# Patient Record
Sex: Female | Born: 1955
Health system: Southern US, Community
[De-identification: ages and names within clinical notes are randomized; demographics above are authoritative.]

## PROBLEM LIST (undated history)

## (undated) DIAGNOSIS — F419 Anxiety disorder, unspecified: Secondary | ICD-10-CM

## (undated) DIAGNOSIS — C55 Malignant neoplasm of uterus, part unspecified: Secondary | ICD-10-CM

## (undated) DIAGNOSIS — I10 Essential (primary) hypertension: Secondary | ICD-10-CM

## (undated) DIAGNOSIS — E039 Hypothyroidism, unspecified: Secondary | ICD-10-CM

## (undated) DIAGNOSIS — E785 Hyperlipidemia, unspecified: Secondary | ICD-10-CM

## (undated) DIAGNOSIS — F32A Depression, unspecified: Secondary | ICD-10-CM

## (undated) DIAGNOSIS — Z90710 Acquired absence of both cervix and uterus: Secondary | ICD-10-CM

## (undated) DIAGNOSIS — T1491XA Suicide attempt, initial encounter: Secondary | ICD-10-CM

## (undated) HISTORY — DX: Acquired absence of both cervix and uterus: Z90.710

## (undated) HISTORY — PX: ABDOMINAL HYSTERECTOMY: SHX81

## (undated) HISTORY — DX: Malignant neoplasm of uterus, part unspecified: C55

## (undated) HISTORY — DX: Hypothyroidism, unspecified: E03.9

## (undated) HISTORY — DX: Hyperlipidemia, unspecified: E78.5

## (undated) HISTORY — DX: Essential (primary) hypertension: I10

## (undated) HISTORY — DX: Suicide attempt, initial encounter: T14.91XA

## (undated) HISTORY — PX: DILATION AND CURETTAGE OF UTERUS: SHX78

---

## 1995-03-26 DIAGNOSIS — T1491XA Suicide attempt, initial encounter: Secondary | ICD-10-CM

## 1995-03-26 HISTORY — DX: Suicide attempt, initial encounter: T14.91XA

## 1996-03-25 HISTORY — PX: CARPAL TUNNEL RELEASE: SHX101

## 1999-04-03 ENCOUNTER — Ambulatory Visit (HOSPITAL_BASED_OUTPATIENT_CLINIC_OR_DEPARTMENT_OTHER): Admission: RE | Admit: 1999-04-03 | Discharge: 1999-04-03 | Payer: Self-pay | Admitting: Orthopedic Surgery

## 2000-03-25 HISTORY — PX: VESICOVAGINAL FISTULA CLOSURE W/ TAH: SUR271

## 2002-01-12 ENCOUNTER — Ambulatory Visit (HOSPITAL_COMMUNITY): Admission: RE | Admit: 2002-01-12 | Discharge: 2002-01-12 | Payer: Self-pay | Admitting: Internal Medicine

## 2002-01-12 ENCOUNTER — Encounter: Payer: Self-pay | Admitting: Internal Medicine

## 2003-03-26 DIAGNOSIS — C55 Malignant neoplasm of uterus, part unspecified: Secondary | ICD-10-CM

## 2003-03-26 HISTORY — DX: Malignant neoplasm of uterus, part unspecified: C55

## 2004-02-22 ENCOUNTER — Ambulatory Visit (HOSPITAL_COMMUNITY): Admission: RE | Admit: 2004-02-22 | Discharge: 2004-02-22 | Payer: Self-pay | Admitting: Orthopedic Surgery

## 2004-09-13 ENCOUNTER — Encounter: Admission: RE | Admit: 2004-09-13 | Discharge: 2004-10-12 | Payer: Self-pay | Admitting: Family Medicine

## 2005-02-05 ENCOUNTER — Other Ambulatory Visit: Admission: RE | Admit: 2005-02-05 | Discharge: 2005-02-05 | Payer: Self-pay

## 2005-03-14 ENCOUNTER — Encounter: Payer: Self-pay | Admitting: Obstetrics and Gynecology

## 2005-03-14 ENCOUNTER — Encounter (INDEPENDENT_AMBULATORY_CARE_PROVIDER_SITE_OTHER): Payer: Self-pay | Admitting: Specialist

## 2005-03-14 ENCOUNTER — Inpatient Hospital Stay (HOSPITAL_COMMUNITY): Admission: RE | Admit: 2005-03-14 | Discharge: 2005-03-17 | Payer: Self-pay | Admitting: Obstetrics and Gynecology

## 2006-02-06 ENCOUNTER — Ambulatory Visit (HOSPITAL_COMMUNITY): Admission: RE | Admit: 2006-02-06 | Discharge: 2006-02-06 | Payer: Self-pay | Admitting: Family Medicine

## 2007-04-02 ENCOUNTER — Encounter: Payer: Self-pay | Admitting: Family Medicine

## 2007-04-02 LAB — HM COLONOSCOPY

## 2007-08-13 ENCOUNTER — Emergency Department (HOSPITAL_COMMUNITY): Admission: EM | Admit: 2007-08-13 | Discharge: 2007-08-13 | Payer: Self-pay | Admitting: Emergency Medicine

## 2007-09-08 ENCOUNTER — Emergency Department (HOSPITAL_COMMUNITY): Admission: EM | Admit: 2007-09-08 | Discharge: 2007-09-09 | Payer: Self-pay | Admitting: Emergency Medicine

## 2007-09-09 ENCOUNTER — Inpatient Hospital Stay (HOSPITAL_COMMUNITY): Admission: AD | Admit: 2007-09-09 | Discharge: 2007-09-15 | Payer: Self-pay | Admitting: Psychiatry

## 2007-09-09 ENCOUNTER — Ambulatory Visit: Payer: Self-pay | Admitting: *Deleted

## 2008-08-23 ENCOUNTER — Ambulatory Visit: Payer: Self-pay | Admitting: Family Medicine

## 2008-08-23 DIAGNOSIS — IMO0001 Reserved for inherently not codable concepts without codable children: Secondary | ICD-10-CM | POA: Insufficient documentation

## 2008-08-23 DIAGNOSIS — F418 Other specified anxiety disorders: Secondary | ICD-10-CM

## 2008-08-23 DIAGNOSIS — R269 Unspecified abnormalities of gait and mobility: Secondary | ICD-10-CM | POA: Insufficient documentation

## 2008-08-23 DIAGNOSIS — I10 Essential (primary) hypertension: Secondary | ICD-10-CM

## 2008-08-23 DIAGNOSIS — E039 Hypothyroidism, unspecified: Secondary | ICD-10-CM

## 2008-08-23 DIAGNOSIS — R519 Headache, unspecified: Secondary | ICD-10-CM | POA: Insufficient documentation

## 2008-08-23 DIAGNOSIS — J42 Unspecified chronic bronchitis: Secondary | ICD-10-CM | POA: Insufficient documentation

## 2008-08-23 DIAGNOSIS — R51 Headache: Secondary | ICD-10-CM | POA: Insufficient documentation

## 2008-08-23 LAB — CONVERTED CEMR LAB
Blood in Urine, dipstick: NEGATIVE
Ketones, urine, test strip: NEGATIVE
Nitrite: NEGATIVE
Specific Gravity, Urine: <1.005
pH: 7

## 2008-08-25 ENCOUNTER — Encounter: Payer: Self-pay | Admitting: Family Medicine

## 2008-08-29 DIAGNOSIS — N3020 Other chronic cystitis without hematuria: Secondary | ICD-10-CM | POA: Insufficient documentation

## 2008-08-30 ENCOUNTER — Ambulatory Visit (HOSPITAL_COMMUNITY): Admission: RE | Admit: 2008-08-30 | Discharge: 2008-08-30 | Payer: Self-pay | Admitting: Family Medicine

## 2008-08-30 ENCOUNTER — Ambulatory Visit (HOSPITAL_COMMUNITY): Payer: Self-pay | Admitting: Psychiatry

## 2008-08-31 ENCOUNTER — Encounter: Payer: Self-pay | Admitting: Family Medicine

## 2008-09-01 ENCOUNTER — Telehealth: Payer: Self-pay | Admitting: Family Medicine

## 2008-09-01 ENCOUNTER — Encounter: Payer: Self-pay | Admitting: Family Medicine

## 2008-09-01 LAB — CONVERTED CEMR LAB
ALT: 36 units/L — ABNORMAL HIGH (ref 0–35)
Albumin: 4.4 g/dL (ref 3.5–5.2)
Bilirubin, Direct: <0.1 mg/dL (ref 0.0–0.3)
CO2: 23 meq/L (ref 19–32)
Glucose, Bld: 97 mg/dL (ref 70–99)
HDL: 68 mg/dL (ref >39)
Hemoglobin: 11.8 g/dL — ABNORMAL LOW (ref 12.0–15.0)
Lymphocytes Relative: 25 % (ref 12–46)
Lymphs Abs: 1.5 10*3/uL (ref 0.7–4.0)
MCHC: 31.1 g/dL (ref 30.0–36.0)
Monocytes Absolute: 0.5 10*3/uL (ref 0.1–1.0)
Monocytes Relative: 9 % (ref 3–12)
Neutro Abs: 3.7 10*3/uL (ref 1.7–7.7)
Potassium: 4.1 meq/L (ref 3.5–5.3)
RBC: 4 M/uL (ref 3.87–5.11)
Retic Ct Pct: 0.8 % (ref 0.4–3.1)
Sodium: 139 meq/L (ref 135–145)
TSH: 1.751 microintl units/mL (ref 0.350–4.500)
Total Bilirubin: 0.2 mg/dL — ABNORMAL LOW (ref 0.3–1.2)
Total CHOL/HDL Ratio: 2.5
VLDL: 17 mg/dL (ref 0–40)
WBC: 5.8 10*3/uL (ref 4.0–10.5)

## 2008-09-05 ENCOUNTER — Telehealth: Payer: Self-pay | Admitting: Family Medicine

## 2008-09-06 ENCOUNTER — Ambulatory Visit (HOSPITAL_COMMUNITY): Payer: Self-pay | Admitting: Psychiatry

## 2008-09-08 ENCOUNTER — Encounter: Admission: RE | Admit: 2008-09-08 | Discharge: 2008-12-07 | Payer: Self-pay | Admitting: Unknown Physician Specialty

## 2008-09-08 ENCOUNTER — Encounter: Payer: Self-pay | Admitting: Family Medicine

## 2008-09-09 ENCOUNTER — Telehealth: Payer: Self-pay | Admitting: Family Medicine

## 2008-09-13 ENCOUNTER — Ambulatory Visit (HOSPITAL_COMMUNITY): Payer: Self-pay | Admitting: Psychiatry

## 2008-09-13 ENCOUNTER — Ambulatory Visit: Payer: Self-pay | Admitting: Family Medicine

## 2008-09-13 DIAGNOSIS — R05 Cough: Secondary | ICD-10-CM | POA: Insufficient documentation

## 2008-09-27 ENCOUNTER — Other Ambulatory Visit: Admission: RE | Admit: 2008-09-27 | Discharge: 2008-09-27 | Payer: Self-pay | Admitting: Family Medicine

## 2008-09-27 ENCOUNTER — Ambulatory Visit (HOSPITAL_COMMUNITY): Admission: RE | Admit: 2008-09-27 | Discharge: 2008-09-27 | Payer: Self-pay | Admitting: Family Medicine

## 2008-09-27 ENCOUNTER — Ambulatory Visit: Payer: Self-pay | Admitting: Family Medicine

## 2008-09-27 ENCOUNTER — Encounter: Payer: Self-pay | Admitting: Family Medicine

## 2008-09-27 DIAGNOSIS — M25529 Pain in unspecified elbow: Secondary | ICD-10-CM | POA: Insufficient documentation

## 2008-09-27 DIAGNOSIS — M542 Cervicalgia: Secondary | ICD-10-CM | POA: Insufficient documentation

## 2008-10-11 ENCOUNTER — Ambulatory Visit (HOSPITAL_COMMUNITY): Payer: Self-pay | Admitting: Psychiatry

## 2008-10-14 ENCOUNTER — Ambulatory Visit (HOSPITAL_COMMUNITY): Payer: Self-pay | Admitting: Psychiatry

## 2008-10-21 ENCOUNTER — Encounter: Payer: Self-pay | Admitting: Family Medicine

## 2008-10-26 ENCOUNTER — Telehealth: Payer: Self-pay | Admitting: Family Medicine

## 2008-10-28 ENCOUNTER — Ambulatory Visit (HOSPITAL_COMMUNITY): Payer: Self-pay | Admitting: Psychiatry

## 2008-11-03 ENCOUNTER — Encounter: Payer: Self-pay | Admitting: Family Medicine

## 2008-11-14 ENCOUNTER — Ambulatory Visit (HOSPITAL_COMMUNITY): Admission: RE | Admit: 2008-11-14 | Discharge: 2008-11-14 | Payer: Self-pay | Admitting: Pulmonary Disease

## 2008-11-14 ENCOUNTER — Encounter: Payer: Self-pay | Admitting: Family Medicine

## 2008-11-16 ENCOUNTER — Encounter: Payer: Self-pay | Admitting: Family Medicine

## 2008-11-24 ENCOUNTER — Ambulatory Visit: Payer: Self-pay | Admitting: Orthopedic Surgery

## 2008-11-24 DIAGNOSIS — M7710 Lateral epicondylitis, unspecified elbow: Secondary | ICD-10-CM | POA: Insufficient documentation

## 2008-11-30 ENCOUNTER — Telehealth: Payer: Self-pay | Admitting: Orthopedic Surgery

## 2008-12-01 ENCOUNTER — Ambulatory Visit (HOSPITAL_COMMUNITY): Payer: Self-pay | Admitting: Psychiatry

## 2008-12-08 ENCOUNTER — Telehealth: Payer: Self-pay | Admitting: Family Medicine

## 2008-12-15 ENCOUNTER — Encounter: Payer: Self-pay | Admitting: Family Medicine

## 2008-12-29 ENCOUNTER — Ambulatory Visit: Payer: Self-pay | Admitting: Family Medicine

## 2008-12-29 DIAGNOSIS — IMO0002 Reserved for concepts with insufficient information to code with codable children: Secondary | ICD-10-CM | POA: Insufficient documentation

## 2009-01-08 DIAGNOSIS — G4700 Insomnia, unspecified: Secondary | ICD-10-CM | POA: Insufficient documentation

## 2009-01-08 DIAGNOSIS — E669 Obesity, unspecified: Secondary | ICD-10-CM | POA: Insufficient documentation

## 2009-01-08 DIAGNOSIS — F17200 Nicotine dependence, unspecified, uncomplicated: Secondary | ICD-10-CM | POA: Insufficient documentation

## 2009-01-09 ENCOUNTER — Telehealth: Payer: Self-pay | Admitting: Family Medicine

## 2009-01-19 ENCOUNTER — Encounter: Payer: Self-pay | Admitting: Family Medicine

## 2009-01-31 ENCOUNTER — Ambulatory Visit (HOSPITAL_COMMUNITY): Payer: Self-pay | Admitting: Psychiatry

## 2009-03-30 ENCOUNTER — Ambulatory Visit (HOSPITAL_COMMUNITY): Payer: Self-pay | Admitting: Psychiatry

## 2009-04-03 ENCOUNTER — Ambulatory Visit: Payer: Self-pay | Admitting: Family Medicine

## 2009-04-06 ENCOUNTER — Encounter: Payer: Self-pay | Admitting: Family Medicine

## 2009-04-07 ENCOUNTER — Encounter: Payer: Self-pay | Admitting: Family Medicine

## 2009-04-07 LAB — CONVERTED CEMR LAB
AST: 27 units/L (ref 0–37)
Albumin: 4.6 g/dL (ref 3.5–5.2)
BUN: 25 mg/dL — ABNORMAL HIGH (ref 6–23)
Bilirubin, Direct: 0.1 mg/dL (ref 0.0–0.3)
Calcium: 10.2 mg/dL (ref 8.4–10.5)
Potassium: 4.1 meq/L (ref 3.5–5.3)
Sodium: 140 meq/L (ref 135–145)
TSH: 2.348 microintl units/mL (ref 0.350–4.500)
Total Bilirubin: 0.4 mg/dL (ref 0.3–1.2)

## 2009-04-10 LAB — CONVERTED CEMR LAB
HDL: 83 mg/dL (ref >39)
Total CHOL/HDL Ratio: 2.6
Triglycerides: 142 mg/dL (ref <150)

## 2009-05-09 ENCOUNTER — Ambulatory Visit (HOSPITAL_COMMUNITY): Payer: Self-pay | Admitting: Psychiatry

## 2009-06-27 ENCOUNTER — Ambulatory Visit (HOSPITAL_COMMUNITY): Payer: Self-pay | Admitting: Psychiatry

## 2009-07-19 ENCOUNTER — Ambulatory Visit: Payer: Self-pay | Admitting: Family Medicine

## 2009-07-19 DIAGNOSIS — F411 Generalized anxiety disorder: Secondary | ICD-10-CM | POA: Insufficient documentation

## 2009-08-22 ENCOUNTER — Ambulatory Visit (HOSPITAL_COMMUNITY): Payer: Self-pay | Admitting: Psychiatry

## 2009-08-22 ENCOUNTER — Telehealth: Payer: Self-pay | Admitting: Family Medicine

## 2009-08-29 ENCOUNTER — Telehealth: Payer: Self-pay | Admitting: Physician Assistant

## 2009-09-01 ENCOUNTER — Encounter: Payer: Self-pay | Admitting: Family Medicine

## 2009-09-13 LAB — CONVERTED CEMR LAB
ALT: 14 units/L (ref 0–35)
AST: 17 units/L (ref 0–37)
Alkaline Phosphatase: 76 units/L (ref 39–117)
BUN: 16 mg/dL (ref 6–23)
Bilirubin, Direct: <0.1 mg/dL (ref 0.0–0.3)
CO2: 23 meq/L (ref 19–32)
Calcium: 9.5 mg/dL (ref 8.4–10.5)
Cholesterol: 167 mg/dL (ref 0–200)
Creatinine, Ser: 0.91 mg/dL (ref 0.40–1.20)
Glucose, Bld: 82 mg/dL (ref 70–99)
Sodium: 139 meq/L (ref 135–145)
TSH: 1.84 microintl units/mL (ref 0.350–4.500)
Total Bilirubin: 0.2 mg/dL — ABNORMAL LOW (ref 0.3–1.2)
Total CHOL/HDL Ratio: 3.3

## 2009-09-18 ENCOUNTER — Telehealth: Payer: Self-pay | Admitting: Family Medicine

## 2009-10-19 ENCOUNTER — Ambulatory Visit (HOSPITAL_COMMUNITY): Payer: Self-pay | Admitting: Psychiatry

## 2009-11-14 ENCOUNTER — Telehealth: Payer: Self-pay | Admitting: Family Medicine

## 2009-11-18 IMAGING — CR DG CHEST 2V
2 series · 2 of 2 positions shown · non-contrast
Comparison: None

CLINICAL DATA: Cough.  Congestion.  Smoking history.  Hypertension.

CHEST - 2 VIEW

[view not recorded (1 of 2)]
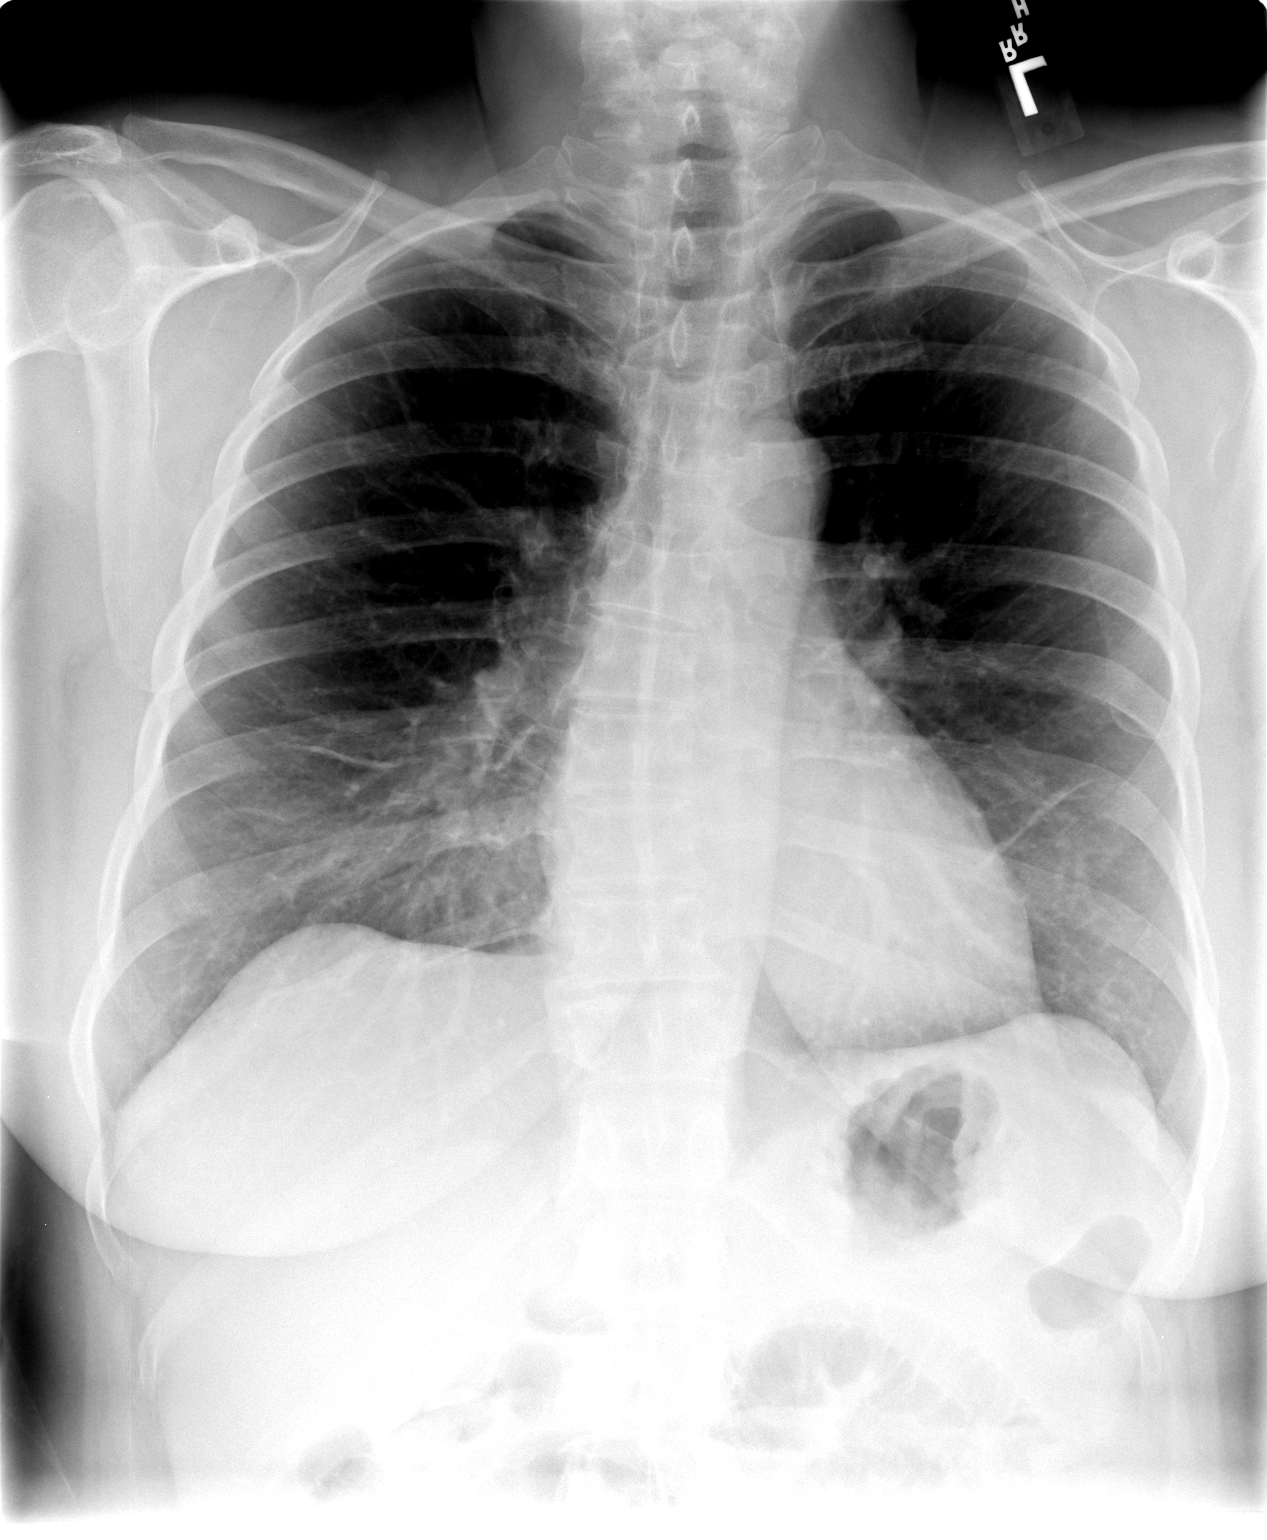

[view not recorded (2 of 2)]
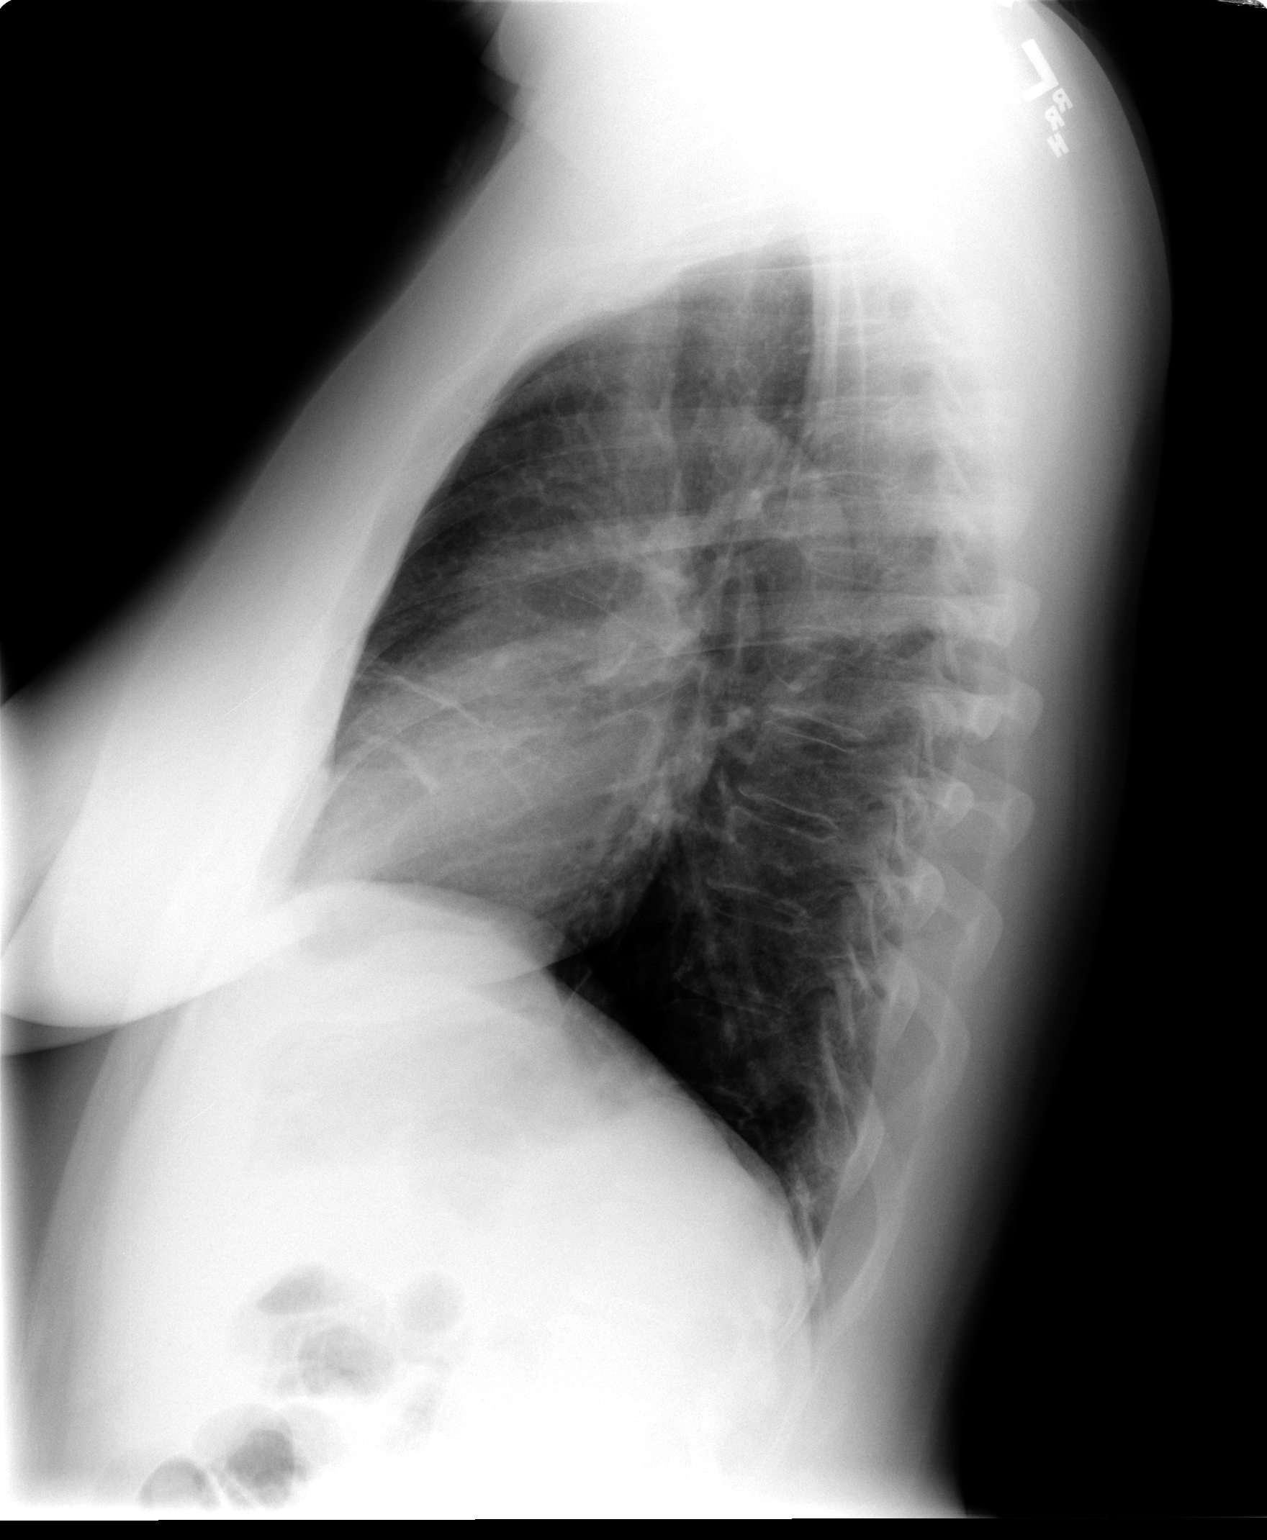

[2 of 2 positions shown; findings below may reference images not displayed]

FINDINGS: Heart size is normal.  The mediastinum is unremarkable.
There are linear lung markings in the right middle lobe and
lingular that could be scarring or minimal linear atelectasis.
Otherwise, the lungs are clear.  No effusions.  No significant bony
finding.  There is mild scoliosis.
IMPRESSION: Linear lung markings in the right middle lobe and lingula.  Usually
this appearance is due to scarring.  Mild atelectasis is possible.

## 2009-11-20 ENCOUNTER — Ambulatory Visit: Payer: Self-pay | Admitting: Family Medicine

## 2009-11-20 DIAGNOSIS — M79609 Pain in unspecified limb: Secondary | ICD-10-CM | POA: Insufficient documentation

## 2009-11-20 DIAGNOSIS — R5381 Other malaise: Secondary | ICD-10-CM | POA: Insufficient documentation

## 2009-11-20 DIAGNOSIS — R5383 Other fatigue: Secondary | ICD-10-CM

## 2009-11-20 DIAGNOSIS — R490 Dysphonia: Secondary | ICD-10-CM | POA: Insufficient documentation

## 2009-11-28 ENCOUNTER — Ambulatory Visit (HOSPITAL_COMMUNITY): Admission: RE | Admit: 2009-11-28 | Discharge: 2009-11-28 | Payer: Self-pay | Admitting: Family Medicine

## 2009-11-28 ENCOUNTER — Telehealth: Payer: Self-pay | Admitting: Family Medicine

## 2009-11-29 ENCOUNTER — Ambulatory Visit: Payer: Self-pay | Admitting: Family Medicine

## 2009-11-29 DIAGNOSIS — J209 Acute bronchitis, unspecified: Secondary | ICD-10-CM | POA: Insufficient documentation

## 2009-11-29 DIAGNOSIS — J45991 Cough variant asthma: Secondary | ICD-10-CM

## 2009-11-29 DIAGNOSIS — B370 Candidal stomatitis: Secondary | ICD-10-CM | POA: Insufficient documentation

## 2009-11-30 ENCOUNTER — Ambulatory Visit (HOSPITAL_COMMUNITY): Payer: Self-pay | Admitting: Psychiatry

## 2009-12-04 ENCOUNTER — Telehealth: Payer: Self-pay | Admitting: Physician Assistant

## 2009-12-20 ENCOUNTER — Encounter: Payer: Self-pay | Admitting: Family Medicine

## 2009-12-28 ENCOUNTER — Ambulatory Visit: Payer: Self-pay | Admitting: Otolaryngology

## 2009-12-28 ENCOUNTER — Encounter: Payer: Self-pay | Admitting: Family Medicine

## 2010-01-04 ENCOUNTER — Ambulatory Visit (HOSPITAL_COMMUNITY): Payer: Self-pay | Admitting: Psychiatry

## 2010-01-25 ENCOUNTER — Ambulatory Visit: Payer: Self-pay | Admitting: Otolaryngology

## 2010-01-29 ENCOUNTER — Encounter: Payer: Self-pay | Admitting: Family Medicine

## 2010-03-06 ENCOUNTER — Ambulatory Visit (HOSPITAL_COMMUNITY): Payer: Self-pay | Admitting: Psychiatry

## 2010-03-29 ENCOUNTER — Encounter: Payer: Self-pay | Admitting: Family Medicine

## 2010-03-29 ENCOUNTER — Ambulatory Visit
Admission: RE | Admit: 2010-03-29 | Discharge: 2010-03-29 | Payer: Self-pay | Source: Home / Self Care | Attending: Otolaryngology | Admitting: Otolaryngology

## 2010-04-11 ENCOUNTER — Ambulatory Visit: Admit: 2010-04-11 | Payer: Self-pay | Admitting: Family Medicine

## 2010-04-19 ENCOUNTER — Ambulatory Visit
Admission: RE | Admit: 2010-04-19 | Discharge: 2010-04-19 | Payer: Self-pay | Source: Home / Self Care | Attending: Family Medicine | Admitting: Family Medicine

## 2010-04-20 ENCOUNTER — Telehealth: Payer: Self-pay | Admitting: Family Medicine

## 2010-04-24 NOTE — Assessment & Plan Note (Signed)
Summary: office vsiit   Vital Signs:  Patient profile:   55 year old female Menstrual status:  hysterectomy Height:      63 inches Weight:      179 pounds BMI:     31.82 O2 Sat:      99 % Pulse rate:   77 / minute Pulse rhythm:   regular Resp:     16 per minute BP sitting:   130 / 90  (left arm) Cuff size:   large  Vitals Entered By: Everitt Amber LPN (July 19, 2009 10:13 AM)  Nutrition Counseling: Patient's BMI is greater than 25 and therefore counseled on weight management options. CC: Follow up chronic problems   Primary Care Provider:  Dr Lodema Hong   CC:  Follow up chronic problems.  History of Present Illness: Reports  that she has been doing better noww that her mOM IS IN A SKILLED FACILITY, SHE IS LESS stressed. Natalia Leatherwood has focused on lifestyle change and has suceeded in reasonable weight loss. Mentally she is better, nd still goes to pstchiatry on a regular basis. Denies recent fever or chills. Denies sinus pressure, nasal congestion , ear pain or sore throat. Denies chest congestion, or cough productive of sputum. Denies chest pain, palpitations, PND, orthopnea or leg swelling. Denies abdominal pain, nausea, vomitting, diarrhea or constipation. Denies change in bowel movements or bloody stool. Denies dysuria , frequency, incontinence or hesitancy. Denies  joint pain, swelling, or reduced mobility. Denies headaches, vertigo, seizures. c/o extremely poor sleep with worsening, adn also uncontrolled anxiety as being the major factor. Denies  rash, lesions, or itch. Her smoking habits have not changed, and she is unwilling to set a quit date at this time.     Preventive Screening-Counseling & Management  Alcohol-Tobacco     Smoking Cessation Counseling: yes  Allergies (verified): 1)  ! Codeine  Review of Systems      See HPI General:  Complains of fatigue and sleep disorder; denies chills and fever. Eyes:  Denies blurring, discharge, eye pain, and red  eye. Derm:  Denies itching, lesion(s), and rash. Neuro:  Denies seizures and sensation of room spinning. Psych:  Complains of anxiety, depression, irritability, and mental problems; denies suicidal thoughts/plans, thoughts of violence, unusual visions or sounds, and thoughts /plans of harming others. Endo:  Denies cold intolerance, excessive hunger, excessive thirst, excessive urination, heat intolerance, polyuria, and weight change. Heme:  Denies abnormal bruising and bleeding. Allergy:  Complains of seasonal allergies.  Physical Exam  General:  alert, well-hydrated, and overweight-appearing.   HEENT: No facial asymmetry,  EOMI, No sinus tenderness, TM's Clear, oropharynx  pink and moist.   Chest: Clear to auscultation bilaterally.  CVS: S1, S2, No murmurs, No S3.   Abd: Soft, Nontender.  MS: Adequate ROM spine, hips, shoulders and knees.  Ext: No edema.   CNS: CN 2-12 intact, power tone and sensation normal throughout.   Skin: Intact, no visible lesions or rashes.  Psych: Good eye contact, normal affect.  Memory intact,less anxious and depressed appearing.    Impression & Recommendations:  Problem # 1:  NICOTINE ADDICTION (ICD-305.1) Assessment Unchanged  Encouraged smoking cessation and discussed different methods for smoking cessation.   Problem # 2:  OBESITY, UNSPECIFIED (ICD-278.00) Assessment: Improved  Ht: 63 (07/19/2009)   Wt: 179 (07/19/2009)   BMI: 31.82 (07/19/2009)  Problem # 3:  DEPRESSION (ICD-311) Assessment: Improved  Her updated medication list for this problem includes:    Budeprion Sr 150 Mg Xr12h-tab (Bupropion  hcl) ..... Take 1 tablet by mouth two times a day    Alprazolam 0.25 Mg Tabs (Alprazolam) .Marland Kitchen... Take 1 tab by mouth at bedtime  Problem # 4:  ESSENTIAL HYPERTENSION (ICD-401.9) Assessment: Unchanged  Her updated medication list for this problem includes:    Maxzide-25 37.5-25 Mg Tabs (Triamterene-hctz) ..... One tab by mouth qd    Amlodipine  Besylate 10 Mg Tabs (Amlodipine besylate) ..... One tab by mouth qd  Orders: T-Basic Metabolic Panel [id number])  BP today: 130/90 Prior BP: 130/90 (04/03/2009)  Labs Reviewed: K+: 4.1 (04/06/2009) Creat: : 0.96 (04/06/2009)   Chol: 215 (04/07/2009)   HDL: 83 (04/07/2009)   LDL: 104 (04/07/2009)   TG: 142 (04/07/2009)  Problem # 5:  UNSPECIFIED HYPOTHYROIDISM (ICD-244.9) Assessment: Comment Only  Her updated medication list for this problem includes:    Levothyroxine Sodium 50 Mcg Tabs (Levothyroxine sodium) .Marland Kitchen... Take 1 tablet by mouth once a day except on sunday take 1 1/2 tabs  Orders: T-TSH [id number])  Labs Reviewed: TSH: 2.348 (04/06/2009)    Chol: 215 (04/07/2009)   HDL: 83 (04/07/2009)   LDL: 104 (04/07/2009)   TG: 142 (04/07/2009)  Problem # 6:  ANXIETY STATE, UNSPECIFIED (ICD-300.00) Assessment: Deteriorated  Her updated medication list for this problem includes:    Budeprion Sr 150 Mg Xr12h-tab (Bupropion hcl) .Marland Kitchen... Take 1 tablet by mouth two times a day    Alprazolam 0.25 Mg Tabs (Alprazolam) .Marland Kitchen... Take 1 tab by mouth at bedtime  Complete Medication List: 1)  Flovent Hfa 220 Mcg/act Aero (Fluticasone propionate  hfa) .... Two puffs in the am and two at bedtime 2)  Budeprion Sr 150 Mg Xr12h-tab (Bupropion hcl) .... Take 1 tablet by mouth two times a day 3)  Crestor 40 Mg Tabs (Rosuvastatin calcium) .... Take 1 tablet by mouth once a day 4)  Levothyroxine Sodium 50 Mcg Tabs (Levothyroxine sodium) .... Take 1 tablet by mouth once a day except on sunday take 1 1/2 tabs 5)  Lamotrigine 25 Mg Tabs (Lamotrigine) .... Take 1 tablet by mouth four times a day 6)  Depakote Er 250 Mg Xr24h-tab (Divalproex sodium) .... Take 1 tablet by mouth every morning 7)  Maxzide-25 37.5-25 Mg Tabs (Triamterene-hctz) .... One tab by mouth qd 8)  Arthrotec 50 50-200 Mg-mcg Tabs (Diclofenac-misoprostol) .... One tab by mouth bid 9)  Amlodipine Besylate 10 Mg Tabs (Amlodipine besylate)  .... One tab by mouth qd 10)  Cyclobenzaprine Hcl 10 Mg Tabs (Cyclobenzaprine hcl) .... One tab by mouth qhs 11)  Symbicort 160-4.5 Mcg/act Aero (Budesonide-formoterol fumarate) .... 2 puffs twice daily 12)  Zyprexa 5 Mg Tabs (Olanzapine) .... One tab by mouth qd 13)  Omeprazole 20 Mg Cpdr (Omeprazole) .... One cap by mouth bid 14)  Restoril 30 Mg Caps (Temazepam) .... Take 1 capsule by mouth at bedtime 15)  Alprazolam 0.25 Mg Tabs (Alprazolam) .... Take 1 tab by mouth at bedtime  Other Orders: T-Hepatic Function [id number]) T-Lipid Profile [phone]) T-Vitamin D (25-Hydroxy) [id number])  Patient Instructions: 1)  Please schedule a follow-up appointment in 4 months. 2)  Tobacco is very bad for your health and your loved ones! You Should stop smoking!. 3)  Stop Smoking Tips: Choose a Quit date. Cut down before the Quit date. decide what you will do as a substitute when you feel the urge to smoke(gum,toothpick,exercise). 4)  It is important that you exercise regularly at least 20 minutes 5 times a week. If you develop chest pain, have severe difficulty breathing,  or feel very tired , stop exercising immediately and seek medical attention. 5)  You need to lose weight. Consider a lower calorie diet and regular exercise. Congrats on 9 pound weight loss, pls keep it up 6)  New med for sleep, and anxiety Prescriptions: ALPRAZOLAM 0.25 MG TABS (ALPRAZOLAM) Take 1 tab by mouth at bedtime  #30 x 3   Entered and Authorized by:   Syliva Overman MD   Signed by:   Syliva Overman MD on 07/19/2009   Method used:   Printed then faxed to ...       [facility]       [address]       [place], [place]  [address]       Ph: [phone] or [phone]       Fax: [phone]   RxID:   [id number]

## 2010-04-24 NOTE — Assessment & Plan Note (Signed)
Summary: sick- room 2   Vital Signs:  Patient profile:   55 year old female Menstrual status:  hysterectomy Height:      63 inches Weight:      184.75 pounds BMI:     32.85 O2 Sat:      98 % on Room air Temp:     98.6 degrees F oral Pulse rate:   108 / minute Resp:     16 per minute BP sitting:   128 / 70  (left arm)  Vitals Entered By: Adella Hare LPN (November 29, 2009 1:32 PM) CC: cough, congestion, body aches, chills Is Patient Diabetic? No Pain Assessment Patient in pain? no        Referring Provider:  Dr Lodema Hong  Primary Provider:  Dr Lodema Hong   CC:  cough, congestion, body aches, and chills.  History of Present Illness: Pt presents today with c/o cough.  States she has had a cough x 3 yrs, has been dx with asthma in the past.  The ocugh has worsened recently.  Cough is productive with green or yellow phlegm.  She is now also having some pain in her ears and has white spots on the roof of her mouth that is making her mouth feel sore.  + post nasal drainage She has been on Symbicort and Flovent two times a day x couple yrs.  No albuterol/resuce inhaler or nmts.  Has been feeling tight.  No wheezing. She received Depo Medrol 80mg  and Medrol Dospak last week.  She states she has had no improvement.   Current Medications (verified): 1)  Flovent Hfa 220 Mcg/act Aero (Fluticasone Propionate  Hfa) .... Two Puffs in The Am and Two At Bedtime 2)  Budeprion Sr 150 Mg Xr12h-Tab (Bupropion Hcl) .... Take 1 Tablet By Mouth Two Times A Day 3)  Crestor 40 Mg Tabs (Rosuvastatin Calcium) .... Take 1 Tablet By Mouth Once A Day 4)  Levothyroxine Sodium 50 Mcg Tabs (Levothyroxine Sodium) .... Take 1 Tablet By Mouth Once A Day Except On Sunday Take 1 1/2 Tabs 5)  Lamotrigine 25 Mg Tabs (Lamotrigine) .... Take 1 Tablet By Mouth Four Times A Day 6)  Depakote Er 250 Mg Xr24h-Tab (Divalproex Sodium) .... Take 1 Tablet By Mouth Every Morning 7)  Maxzide-25 37.5-25 Mg Tabs (Triamterene-Hctz)  .... One Tab By Mouth Qd 8)  Arthrotec 50 50-200 Mg-Mcg Tabs (Diclofenac-Misoprostol) .... One Tab By Mouth Bid 9)  Amlodipine Besylate 10 Mg Tabs (Amlodipine Besylate) .... One Tab By Mouth Qd 10)  Cyclobenzaprine Hcl 10 Mg Tabs (Cyclobenzaprine Hcl) .... One Tab By Mouth Qhs 11)  Symbicort 160-4.5 Mcg/act Aero (Budesonide-Formoterol Fumarate) .... 2 Puffs Twice Daily 12)  Zyprexa 5 Mg Tabs (Olanzapine) .... One Tab By Mouth Qd 13)  Omeprazole 20 Mg Cpdr (Omeprazole) .... One Cap By Mouth Bid 14)  Restoril 30 Mg Caps (Temazepam) .... Take 1 Capsule By Mouth At Bedtime 15)  Trazodone Hcl 100 Mg Tabs (Trazodone Hcl) .... One Tab At Bedtime 16)  Indomethacin 25 Mg Caps (Indomethacin) .Marland Kitchen.. 1 Three Times Daily For 2 Days , Then 1twice Daily For 2 Days, Then 1 Daily  Allergies (verified): 1)  ! Codeine  Past History:  Past medical history reviewed for relevance to current acute and chronic problems.  Past Medical History: Reviewed history from 09/27/2008 and no changes required. HTN hyperlipidemia Hypothyroidism Asthma suicide attempts x 2 approx 1997 when a relationship broke up, and 3 months ago because of ill health of  her parents. h/o uterine cancer, s/p hysterectomyr  Review of Systems General:  Complains of chills; denies fever. ENT:  Complains of earache, nasal congestion, and postnasal drainage; denies sore throat. CV:  Denies chest pain or discomfort and palpitations. Resp:  Complains of cough, shortness of breath, and sputum productive; denies wheezing.  Physical Exam  General:  Well-developed,well-nourished,in no acute distress; alert,appropriate and cooperative throughout examination Head:  Normocephalic and atraumatic without obvious abnormalities. No apparent alopecia or balding. Ears:  External ear exam shows no significant lesions or deformities.  Otoscopic examination reveals clear canals, tympanic membranes are intact bilaterally without bulging, retraction,  inflammation or discharge. Hearing is grossly normal bilaterally. Nose:  External nasal examination shows no deformity or inflammation. Nasal mucosa are pink and moist without lesions or exudates. Mouth:  pharynx pink and moist, no posterior lymphoid hypertrophy, no postnasal drip, and white plaque(s) noted on soft palate. Neck:  No deformities, masses, or tenderness noted. Lungs:  coarse BS bilat and freq deep cough.  no rales, or wheeze after NMT CTA Heart:  Normal rate and regular rhythm. S1 and S2 normal without gallop, murmur, click, rub or other extra sounds. Cervical Nodes:  No lymphadenopathy noted Psych:  Cognition and judgment appear intact. Alert and cooperative with normal attention span and concentration. No apparent delusions, illusions, hallucinations   Impression & Recommendations:  Problem # 1:  ACUTE BRONCHITIS (ICD-466.0) Assessment Deteriorated  Her updated medication list for this problem includes:    Flovent Hfa 220 Mcg/act Aero (Fluticasone propionate  hfa) .Marland Kitchen..Marland Kitchen Two puffs in the am and two at bedtime    Symbicort 160-4.5 Mcg/act Aero (Budesonide-formoterol fumarate) .Marland Kitchen... 2 puffs twice daily    Levaquin 750 Mg Tabs (Levofloxacin) .Marland Kitchen... Take 1 tab daily    Proair Hfa 108 (90 Base) Mcg/act Aers (Albuterol sulfate) ..... Use 2 puffs every 4 hrs as needed  Orders: Nebulizer Tx (60454)  Problem # 2:  ASTHMA (ICD-493.90) Assessment: Deteriorated  Her updated medication list for this problem includes:    Flovent Hfa 220 Mcg/act Aero (Fluticasone propionate  hfa) .Marland Kitchen..Marland Kitchen Two puffs in the am and two at bedtime    Symbicort 160-4.5 Mcg/act Aero (Budesonide-formoterol fumarate) .Marland Kitchen... 2 puffs twice daily    Proair Hfa 108 (90 Base) Mcg/act Aers (Albuterol sulfate) ..... Use 2 puffs every 4 hrs as needed  Orders: Nebulizer Tx ([id number])  Problem # 3:  THRUSH (ICD-112.0) Assessment: New Discussed with pt that htis is most likely due to her inhalers.  Discussed prevention by  rinsing her mouth after use.  Complete Medication List: 1)  Flovent Hfa 220 Mcg/act Aero (Fluticasone propionate  hfa) .... Two puffs in the am and two at bedtime 2)  Budeprion Sr 150 Mg Xr12h-tab (Bupropion hcl) .... Take 1 tablet by mouth two times a day 3)  Crestor 40 Mg Tabs (Rosuvastatin calcium) .... Take 1 tablet by mouth once a day 4)  Levothyroxine Sodium 50 Mcg Tabs (Levothyroxine sodium) .... Take 1 tablet by mouth once a day except on sunday take 1 1/2 tabs 5)  Lamotrigine 25 Mg Tabs (Lamotrigine) .... Take 1 tablet by mouth four times a day 6)  Depakote Er 250 Mg Xr24h-tab (Divalproex sodium) .... Take 1 tablet by mouth every morning 7)  Maxzide-25 37.5-25 Mg Tabs (Triamterene-hctz) .... One tab by mouth qd 8)  Arthrotec 50 50-200 Mg-mcg Tabs (Diclofenac-misoprostol) .... One tab by mouth bid 9)  Amlodipine Besylate 10 Mg Tabs (Amlodipine besylate) .... One tab by mouth qd 10)  Cyclobenzaprine Hcl 10 Mg Tabs (Cyclobenzaprine hcl) .... One tab by mouth qhs 11)  Symbicort 160-4.5 Mcg/act Aero (Budesonide-formoterol fumarate) .... 2 puffs twice daily 12)  Zyprexa 5 Mg Tabs (Olanzapine) .... One tab by mouth qd 13)  Omeprazole 20 Mg Cpdr (Omeprazole) .... One cap by mouth bid 14)  Restoril 30 Mg Caps (Temazepam) .... Take 1 capsule by mouth at bedtime 15)  Trazodone Hcl 100 Mg Tabs (Trazodone hcl) .... One tab at bedtime 16)  Indomethacin 25 Mg Caps (Indomethacin) .Marland Kitchen.. 1 three times daily for 2 days , then 1twice daily for 2 days, then 1 daily 17)  Fluconazole 100 Mg Tabs (Fluconazole) .... Take 2 today then 1 daily for the next 6 days 18)  Levaquin 750 Mg Tabs (Levofloxacin) .... Take 1 tab daily 19)  Proair Hfa 108 (90 Base) Mcg/act Aers (Albuterol sulfate) .... Use 2 puffs every 4 hrs as needed  Patient Instructions: 1)  Recheck in one week if no improvement, sooner if worsens. 2)  Continue using your Flovent and Symbicort twice a day.  Rinse your mouth out after each use. 3)  I  have prescribed and antibiotic for your bronchitits, a medicine for your thrush and an inhaler to use as needed for your breathing. Prescriptions: PROAIR HFA 108 (90 BASE) MCG/ACT AERS (ALBUTEROL SULFATE) use 2 puffs every 4 hrs as needed  #1 x 1   Entered and Authorized by:   Esperanza Sheets PA   Signed by:   Esperanza Sheets PA on 11/29/2009   Method used:   Printed then faxed to ...       [facility] (retail)       [address]       [place], [place]  [address]       Ph: [phone] or [phone]       Fax: [phone]   RxID:   [id number] LEVAQUIN 750 MG TABS (LEVOFLOXACIN) take 1 tab daily  #7 x 0   Entered and Authorized by:   Esperanza Sheets PA   Signed by:   Esperanza Sheets PA on 11/29/2009   Method used:   Printed then faxed to ...       Hospital doctor (retail)       [address]       [place], [place]  [address]       Ph: [phone] or [phone]       Fax: [phone]   RxID:   [id number] FLUCONAZOLE 100 MG TABS (FLUCONAZOLE) take 2 today then 1 daily for the next 6 days  #8 x 0   Entered and Authorized by:   Esperanza Sheets PA   Signed by:   Esperanza Sheets PA on 11/29/2009   Method used:   Printed then faxed to ...       Hospital doctor (retail)       [address]       [place], [place]  [address]       Ph: [phone] or [phone]       Fax: [phone]   RxID:   [id number]   Appended Document: sick- room 2   Medication Administration  Medication # 1:    Medication: Albuterol Sulfate Sol 1mg  unit dose    Diagnosis: ASTHMA (ICD-493.90)    Dose: 2.5mg     Route:  inhaled    Exp Date: 8/12    Lot #: [id number]    Mfr: nephron pharm    Patient tolerated medication without complications    Given by: Adella Hare LPN (November 29, 2009 4:46 PM)  Orders Added: 1)  Albuterol Sulfate Sol 1mg  unit dose [J7613] 2)  Nebulizer Tx [04540]

## 2010-04-24 NOTE — Letter (Signed)
Summary: ENT  ENT   Imported By: Lind Guest 02/01/2010 11:29:33  _____________________________________________________________________  External Attachment:    Type:   Image     Comment:   External Document

## 2010-04-24 NOTE — Progress Notes (Signed)
Summary: cold is worse, updated  Phone Note Call from Patient   Summary of Call: pt has no change in cold and it has gotten worse. out of antibotics and also having alittle burning when urinating. [phone] Initial call taken by: Rudene Anda,  December 04, 2009 11:18 AM  Follow-up for Phone Call        needs appt Follow-up by: Adella Hare LPN,  December 04, 2009 12:06 PM  Additional Follow-up for Phone Call Additional follow up Details #1::        I explained to patient and she states she can't come in here she feels bad, and she was just in twice last week.  She can't come in she doesn't feel like it.  Please advise. Additional Follow-up by: Curtis Sites,  December 04, 2009 2:59 PM    Additional Follow-up for Phone Call Additional follow up Details #2::    patient asked about any over the counter meds she could try for the congestion, advised mucinex and have pharmacist check her meds to make sure no interactions before purchasing also advised she needs ov for prescription abx if no better Follow-up by: Adella Hare LPN,  December 04, 2009 3:52 PM

## 2010-04-24 NOTE — Progress Notes (Signed)
Summary: MEDICINES  Phone Note Call from Patient   Summary of Call: WANTS YOU TO CALL HER ABOUT HER MEDICINES CALL BACK AT [phone] Initial call taken by: Lind Guest,  September 18, 2009 8:48 AM  Follow-up for Phone Call        sleeping med is not working, doesnt fall asleep until extremely late and still gets up and down   Follow-up by: Adella Hare LPN,  September 18, 2009 11:53 AM  Additional Follow-up for Phone Call Additional follow up Details #1::        I already spoke with her about this and advised she discuss this with her psychiatrist Additional Follow-up by: Syliva Overman MD,  September 18, 2009 1:02 PM    Additional Follow-up for Phone Call Additional follow up Details #2::    patient aware Follow-up by: Adella Hare LPN,  September 18, 2009 2:59 PM

## 2010-04-24 NOTE — Progress Notes (Signed)
Summary: CALL  Phone Note Call from Patient   Summary of Call: WANTS YOU TO CALL HER BACK  Initial call taken by: Lind Guest,  September 18, 2009 2:58 PM  Follow-up for Phone Call        patient aware to ask mental health provider at [facility] tomorrow Follow-up by: Adella Hare LPN,  September 18, 2009 3:12 PM

## 2010-04-24 NOTE — Assessment & Plan Note (Signed)
Summary: ov   Vital Signs:  Patient profile:   55 year old female Menstrual status:  hysterectomy Height:      63 inches Weight:      188.50 pounds BMI:     33.51 O2 Sat:      98 % on Room air Pulse rate:   110 / minute Pulse rhythm:   regular Resp:     16 per minute BP sitting:   130 / 90 Cuff size:   large  Vitals Entered By: Everitt Amber (April 03, 2009 10:55 AM)  Nutrition Counseling: Patient's BMI is greater than 25 and therefore counseled on weight management options.  O2 Flow:  Room air CC: Follow up chronic problems, has problems sleeping   Primary Care Provider:  Dr Lodema Hong   CC:  Follow up chronic problems and has problems sleeping.  History of Present Illness: pt c/o increased anxiety and overating in the past 2 mon ths as her mothere's health has deteriorated with the loss of her father. Sjhe is not oi  therapy due to the cost, but states that she sees the psychiatrist on avg every 3 months and he is pleased with her esponse to treatment. she also reports excellent supportfrom her partener. She still has a cough and nocturia, drinks water all of the tim and is battling her nicotne addiction. Pt has to commit to regularexercise nd weight loss, esp in lght of uncontrolled HTN  Preventive Screening-Counseling & Management  Alcohol-Tobacco     Smoking Cessation Counseling: yes  Allergies: 1)  ! Codeine  Family History: Mother-living-HTN, Cataracts, hyperlipidemia father-living, CVA, CAD, hyperlipidemia, deceased in 08-12-2008 4 sisters-unknown 1 brother-unknown  Review of Systems      See HPI General:  Complains of fatigue, sleep disorder, and sweats. Eyes:  Denies blurring and discharge. ENT:  Denies hoarseness, nasal congestion, and sinus pressure. CV:  Denies chest pain or discomfort, palpitations, and swelling of feet. Resp:  Complains of cough; hacking cough early morning, smokes  approx 10/day. GI:  Denies abdominal pain, constipation, diarrhea,  nausea, and vomiting. GU:  Complains of nocturia and urinary frequency; still drinks excfessive quantities of water. MS:  Complains of low back pain, mid back pain, and muscle weakness; c/o back pain  and right hand weakness. Derm:  Denies itching, lesion(s), and rash. Neuro:  Denies headaches, seizures, and sensation of room spinning. Psych:  Complains of depression, easily angered, easily tearful, and irritability; denies panic attacks, suicidal thoughts/plans, thoughts of violence, and unusual visions or sounds. Endo:  Denies cold intolerance, excessive hunger, excessive thirst, excessive urination, heat intolerance, polyuria, and weight change. Heme:  Denies abnormal bruising and bleeding. Allergy:  Complains of seasonal allergies.  Physical Exam  General:  alert, well-hydrated, and overweight-appearing.   HEENT: No facial asymmetry,  EOMI, No sinus tenderness, TM's Clear, oropharynx  pink and moist.   Chest: Clear to auscultation bilaterally.  CVS: S1, S2, No murmurs, No S3.   Abd: Soft, Nontender.  MS: Adequate ROM spine, hips, shoulders and knees.  Ext: No edema.   CNS: CN 2-12 intact, power tone and sensation normal throughout.   Skin: Intact, no visible lesions or rashes.  Psych: Good eye contact, normal affect.  Memory intact,less anxious and depressed appearing.    Impression & Recommendations:  Problem # 1:  NICOTINE ADDICTION (ICD-305.1) Assessment Unchanged  Encouraged smoking cessation and discussed different methods for smoking cessation.   Problem # 2:  OBESITY, UNSPECIFIED (ICD-278.00) Assessment: Deteriorated  Ht: 63 (04/03/2009)  Wt: 188.50 (04/03/2009)   BMI: 33.51 (04/03/2009)  Problem # 3:  COUGH (ICD-786.2) Assessment: Improved  Problem # 4:  ESSENTIAL HYPERTENSION (ICD-401.9) Assessment: Deteriorated  Her updated medication list for this problem includes:    Maxzide-25 37.5-25 Mg Tabs (Triamterene-hctz) ..... One tab by mouth qd    Amlodipine  Besylate 10 Mg Tabs (Amlodipine besylate) ..... One tab by mouth qd  Orders: T-Basic Metabolic Panel [id number])  BP today: 130/90 Prior BP: 120/80 (12/29/2008)  Labs Reviewed: K+: 4.1 (08/31/2008) Creat: : 1.21 (08/31/2008)   Chol: 171 (08/31/2008)   HDL: 68 (08/31/2008)   LDL: 86 (08/31/2008)   TG: 83 (08/31/2008)  Problem # 5:  UNSPECIFIED HYPOTHYROIDISM (ICD-244.9) Assessment: Comment Only  Her updated medication list for this problem includes:    Levothyroxine Sodium 50 Mcg Tabs (Levothyroxine sodium) .Marland Kitchen... Take 1 tablet by mouth once a day except on sunday take 1 1/2 tabs  Orders: T-TSH [phone])  Labs Reviewed: TSH: 1.751 (08/31/2008)    Chol: 171 (08/31/2008)   HDL: 68 (08/31/2008)   LDL: 86 (08/31/2008)   TG: 83 (08/31/2008)  Complete Medication List: 1)  Flovent Hfa 220 Mcg/act Aero (Fluticasone propionate  hfa) .... Two puffs in the am and two at bedtime 2)  Budeprion Sr 150 Mg Xr12h-tab (Bupropion hcl) .... Take 1 tablet by mouth two times a day 3)  Crestor 40 Mg Tabs (Rosuvastatin calcium) .... Take 1 tablet by mouth once a day 4)  Levothyroxine Sodium 50 Mcg Tabs (Levothyroxine sodium) .... Take 1 tablet by mouth once a day except on sunday take 1 1/2 tabs 5)  Lamotrigine 25 Mg Tabs (Lamotrigine) .... Take 1 tablet by mouth four times a day 6)  Depakote Er 250 Mg Xr24h-tab (Divalproex sodium) .... Take 1 tablet by mouth every morning 7)  Maxzide-25 37.5-25 Mg Tabs (Triamterene-hctz) .... One tab by mouth qd 8)  Arthrotec 50 50-200 Mg-mcg Tabs (Diclofenac-misoprostol) .... One tab by mouth bid 9)  Amlodipine Besylate 10 Mg Tabs (Amlodipine besylate) .... One tab by mouth qd 10)  Cyclobenzaprine Hcl 10 Mg Tabs (Cyclobenzaprine hcl) .... One tab by mouth qhs 11)  Symbicort 160-4.5 Mcg/act Aero (Budesonide-formoterol fumarate) .... 2 puffs twice daily 12)  Zyprexa 5 Mg Tabs (Olanzapine) .... One tab by mouth qd 13)  Omeprazole 20 Mg Cpdr (Omeprazole) .... One cap  by mouth bid 14)  Restoril 30 Mg Caps (Temazepam) .... Take 1 capsule by mouth at bedtime  Other Orders: T-Hepatic Function ([id number])  Patient Instructions: 1)  Please schedule a follow-up appointment in 3.5 months. 2)  BMP prior to visit, ICD-9: 3)  Hepatic Panel prior to visit, ICD-9:   fasting on 01/17 2010. 4)  TSH prior to visit, ICD-9: 5)  Tobacco is very bad for your health and your loved ones! You Should stop smoking!. 6)  Stop Smoking Tips: Choose a Quit date. Cut down before the Quit date. decide what you will do as a substitute when you feel the urge to smoke(gum,toothpick,exercise). 7)  It is important that you exercise regularly at least 20 minutes 5 times a week. If you develop chest pain, have severe difficulty breathing, or feel very tired , stop exercising immediately and seek medical attention. 8)  no new meds  9)  You need to lose weight. Consider a lower calorie diet and regular exercise.

## 2010-04-24 NOTE — Miscellaneous (Signed)
Summary: PT order  PT order   Imported By: Cammie Sickle 07/10/2009 06:47:04  _____________________________________________________________________  External Attachment:    Type:   Image     Comment:   External Document

## 2010-04-24 NOTE — Progress Notes (Signed)
Summary: med  Phone Note Call from Patient   Summary of Call: cant take xanax. pre dr. Florentina Jenny. [phone] Initial call taken by: Rudene Anda,  Aug 22, 2009 3:43 PM  Follow-up for Phone Call        noted and removed from med list Follow-up by: Adella Hare LPN,  Aug 22, 2009 4:02 PM

## 2010-04-24 NOTE — Miscellaneous (Signed)
Summary: INFLUENZA  INFLUENZA   Imported By: Lind Guest 12/21/2009 11:40:41  _____________________________________________________________________  External Attachment:    Type:   Image     Comment:   External Document

## 2010-04-24 NOTE — Letter (Signed)
Summary: discharge medication  discharge medication   Imported By: Lind Guest 09/01/2009 16:15:36  _____________________________________________________________________  External Attachment:    Type:   Image     Comment:   External Document

## 2010-04-24 NOTE — Progress Notes (Signed)
Summary: pt still coughing  Phone Note Call from Patient   Summary of Call: having trouble sleeping and coughing alot and throat is burning. wants to get something called in to [facility] [phone] Initial call taken by: Rudene Anda,  November 28, 2009 4:46 PM  Follow-up for Phone Call        needs to schedule ov Follow-up by: Adella Hare LPN,  November 28, 2009 4:47 PM  Additional Follow-up for Phone Call Additional follow up Details #1::        called and left message for pt to call office back for a ov. Additional Follow-up by: Rudene Anda,  November 29, 2009 8:12 AM

## 2010-04-24 NOTE — Letter (Signed)
Summary: ENT / [facility]  ENT / [facility]   Imported By: Lind Guest 01/03/2010 14:29:43  _____________________________________________________________________  External Attachment:    Type:   Image     Comment:   External Document

## 2010-04-24 NOTE — Progress Notes (Signed)
Summary: SLEEP  Phone Note Call from Patient   Summary of Call: SHE IS NOT TAKING XANAX ANYMORE STILL CANT SLEEP AT NIGHT  CALL BACK AT  [phone] CALL AGAIN TODAY 6.8.11 Initial call taken by: Lind Guest,  August 29, 2009 2:03 PM  Follow-up for Phone Call        PT ADVISED TO HAVWE HER PSYCGH PRESCRIBE FOR HER SLEEP, SHE AGREED Follow-up by: Syliva Overman MD,  August 30, 2009 5:25 PM  Additional Follow-up for Phone Call Additional follow up Details #1::        PLS FAX D/C Prudy Feeler TO THE PHARMACY AND STAMP Additional Follow-up by: Syliva Overman MD,  August 30, 2009 5:26 PM    Additional Follow-up for Phone Call Additional follow up Details #2::    d/c orders sent  Follow-up by: Everitt Amber LPN,  August 31, 6960 7:54 AM

## 2010-04-24 NOTE — Assessment & Plan Note (Signed)
Summary: office visit   Vital Signs:  Patient profile:   55 year old female Menstrual status:  hysterectomy Height:      63 inches Weight:      188.75 pounds BMI:     33.56 O2 Sat:      98 % Pulse rate:   101 / minute Pulse rhythm:   regular Resp:     16 per minute BP sitting:   140 / 90  (left arm) Cuff size:   large  Vitals Entered By: Everitt Amber LPN (November 20, 2009 10:24 AM)  Nutrition Counseling: Patient's BMI is greater than 25 and therefore counseled on weight management options. CC: Follow up chronic problems   Primary Care Provider:  Dr Lodema Hong   CC:  Follow up chronic problems.  History of Present Illness: Reports  that she has been  doing well. Denies recent fever or chills. Denies sinus pressure, nasal congestion , ear pain or sore throat.She is concerned about persistent hoarseness and wants this checked, has a h/o throat cancer in some relatives, and she continues to smoke though trying to quit. Denies chest congestion, or cough productive of sputum. Denies chest pain, palpitations, PND, orthopnea or leg swelling. Denies abdominal pain, nausea, vomitting, diarrhea or constipation. Denies change in bowel movements or bloody stool. Denies dysuria , frequency, incontinence or hesitancy. Denies  joint pain, swelling, or reduced mobility.Does c/o bilateral heel pain Denies headaches, vertigo, seizures. Denies depression, anxiety or insomnia.improved tremendously with new meds, her mopther's condition, and a good relationship with her partener. Denies  rash, lesions, or itch.     Preventive Screening-Counseling & Management  Alcohol-Tobacco     Smoking Cessation Counseling: yes  Current Medications (verified): 1)  Flovent Hfa 220 Mcg/act Aero (Fluticasone Propionate  Hfa) .... Two Puffs in The Am and Two At Bedtime 2)  Budeprion Sr 150 Mg Xr12h-Tab (Bupropion Hcl) .... Take 1 Tablet By Mouth Two Times A Day 3)  Crestor 40 Mg Tabs (Rosuvastatin Calcium) ....  Take 1 Tablet By Mouth Once A Day 4)  Levothyroxine Sodium 50 Mcg Tabs (Levothyroxine Sodium) .... Take 1 Tablet By Mouth Once A Day Except On Sunday Take 1 1/2 Tabs 5)  Lamotrigine 25 Mg Tabs (Lamotrigine) .... Take 1 Tablet By Mouth Four Times A Day 6)  Depakote Er 250 Mg Xr24h-Tab (Divalproex Sodium) .... Take 1 Tablet By Mouth Every Morning 7)  Maxzide-25 37.5-25 Mg Tabs (Triamterene-Hctz) .... One Tab By Mouth Qd 8)  Arthrotec 50 50-200 Mg-Mcg Tabs (Diclofenac-Misoprostol) .... One Tab By Mouth Bid 9)  Amlodipine Besylate 10 Mg Tabs (Amlodipine Besylate) .... One Tab By Mouth Qd 10)  Cyclobenzaprine Hcl 10 Mg Tabs (Cyclobenzaprine Hcl) .... One Tab By Mouth Qhs 11)  Symbicort 160-4.5 Mcg/act Aero (Budesonide-Formoterol Fumarate) .... 2 Puffs Twice Daily 12)  Zyprexa 5 Mg Tabs (Olanzapine) .... One Tab By Mouth Qd 13)  Omeprazole 20 Mg Cpdr (Omeprazole) .... One Cap By Mouth Bid 14)  Restoril 30 Mg Caps (Temazepam) .... Take 1 Capsule By Mouth At Bedtime 15)  Trazodone Hcl 100 Mg Tabs (Trazodone Hcl) .... One Tab At Bedtime  Allergies (verified): 1)  ! Codeine  Review of Systems      See HPI General:  Complains of fatigue. ENT:  Complains of hoarseness; painless hoarseness which is worsening , aunt had throat ca, wants ENT. MS:  bilateral heel pain , when she firtst starts walking for approx 2 years getting worse. Psych:  Complains of anxiety, depression, and  mental problems; denies suicidal thoughts/plans, thoughts of violence, and unusual visions or sounds; improved on current meds and with therapy. Endo:  Denies cold intolerance, excessive hunger, excessive thirst, and heat intolerance. Heme:  Denies abnormal bruising and bleeding. Allergy:  Complains of seasonal allergies.  Physical Exam  General:  Well-developed,well-nourished,in no acute distress; alert,appropriate and cooperative throughout examination HEENT: No facial asymmetry,  EOMI, No sinus tenderness, TM's Clear,  oropharynx  pink and moist.   Chest: Clear to auscultation bilaterally.  CVS: S1, S2, No murmurs, No S3.   Abd: Soft, Nontender.  MS: Adequate ROM spine, hips, shoulders and knees. tender to palpation over both heels Ext: No edema.   CNS: CN 2-12 intact, power tone and sensation normal throughout.   Skin: Intact, no visible lesions or rashes.  Psych: Good eye contact, normal affect.  Memory intact, not anxious or depressed appearing.    Impression & Recommendations:  Problem # 1:  HEEL PAIN (ICD-729.5) Assessment Deteriorated  Orders: Depo- Medrol 80mg  (J1040) Ketorolac-Toradol 15mg  (U9811) Admin of Therapeutic Inj  intramuscular or subcutaneous (91478)  Problem # 2:  HOARSENESS, CHRONIC (GNF-621.30) Assessment: Unchanged  Future Orders: ENT Referral (ENT) ... 11/22/2009  Problem # 3:  ANXIETY STATE, UNSPECIFIED (ICD-300.00) Assessment: Improved  Her updated medication list for this problem includes:    Budeprion Sr 150 Mg Xr12h-tab (Bupropion hcl) .Marland Kitchen... Take 1 tablet by mouth two times a day    Trazodone Hcl 100 Mg Tabs (Trazodone hcl) ..... One tab at bedtimept encouraged to continue with psychiatry  Problem # 4:  OBESITY, UNSPECIFIED (ICD-278.00) Assessment: Improved  Ht: 63 (11/20/2009)   Wt: 188.75 (11/20/2009)   BMI: 33.56 (11/20/2009)  Problem # 5:  ESSENTIAL HYPERTENSION (ICD-401.9) Assessment: Deteriorated  Her updated medication list for this problem includes:    Maxzide-25 37.5-25 Mg Tabs (Triamterene-hctz) ..... One tab by mouth qd    Amlodipine Besylate 10 Mg Tabs (Amlodipine besylate) ..... One tab by mouth qd  BP today: 140/90, pt encouraged to reduce sodium intake and exercise regularly as well as lose weight Prior BP: 130/90 (07/19/2009)  Labs Reviewed: K+: 4.0 (09/13/2009) Creat: : 0.91 (09/13/2009)   Chol: 167 (09/13/2009)   HDL: 51 (09/13/2009)   LDL: 95 (09/13/2009)   TG: 106 (09/13/2009)  Problem # 6:  UNSPECIFIED HYPOTHYROIDISM  (ICD-244.9) Assessment: Comment Only  Her updated medication list for this problem includes:    Levothyroxine Sodium 50 Mcg Tabs (Levothyroxine sodium) .Marland Kitchen... Take 1 tablet by mouth once a day except on sunday take 1 1/2 tabs  Labs Reviewed: TSH: 1.840 (09/13/2009)    Chol: 167 (09/13/2009)   HDL: 51 (09/13/2009)   LDL: 95 (09/13/2009)   TG: 106 (09/13/2009)  Complete Medication List: 1)  Flovent Hfa 220 Mcg/act Aero (Fluticasone propionate  hfa) .... Two puffs in the am and two at bedtime 2)  Budeprion Sr 150 Mg Xr12h-tab (Bupropion hcl) .... Take 1 tablet by mouth two times a day 3)  Crestor 40 Mg Tabs (Rosuvastatin calcium) .... Take 1 tablet by mouth once a day 4)  Levothyroxine Sodium 50 Mcg Tabs (Levothyroxine sodium) .... Take 1 tablet by mouth once a day except on sunday take 1 1/2 tabs 5)  Lamotrigine 25 Mg Tabs (Lamotrigine) .... Take 1 tablet by mouth four times a day 6)  Depakote Er 250 Mg Xr24h-tab (Divalproex sodium) .... Take 1 tablet by mouth every morning 7)  Maxzide-25 37.5-25 Mg Tabs (Triamterene-hctz) .... One tab by mouth qd 8)  Arthrotec 50 50-200  Mg-mcg Tabs (Diclofenac-misoprostol) .... One tab by mouth bid 9)  Amlodipine Besylate 10 Mg Tabs (Amlodipine besylate) .... One tab by mouth qd 10)  Cyclobenzaprine Hcl 10 Mg Tabs (Cyclobenzaprine hcl) .... One tab by mouth qhs 11)  Symbicort 160-4.5 Mcg/act Aero (Budesonide-formoterol fumarate) .... 2 puffs twice daily 12)  Zyprexa 5 Mg Tabs (Olanzapine) .... One tab by mouth qd 13)  Omeprazole 20 Mg Cpdr (Omeprazole) .... One cap by mouth bid 14)  Restoril 30 Mg Caps (Temazepam) .... Take 1 capsule by mouth at bedtime 15)  Trazodone Hcl 100 Mg Tabs (Trazodone hcl) .... One tab at bedtime 16)  Indomethacin 25 Mg Caps (Indomethacin) .Marland Kitchen.. 1 three times daily for 2 days , then 1twice daily for 2 days, then 1 daily  Other Orders: T-CBC w/Diff ([id number]) T-TSH ([id number]) Radiology Referral (Radiology)  Patient  Instructions: 1)  Please schedule a follow-up appointment in 3 .5months. 2)  Tobacco is very bad for your health and your loved ones! You Should stop smoking!. 3)  Stop Smoking Tips: Choose a Quit date. Cut down before the Quit date. decide what you will do as a substitute when you feel the urge to smoke(gum,toothpick,exercise). 4)  TSH prior to visit, ICD-9: 5)  CBC w/ Diff prior to visit, ICD-9:   in 3.5 months 6)  Mamo is due we will schedule 7)  you will be referred to Dr Danice Goltz about your hoarseness  Prescriptions: INDOMETHACIN 25 MG CAPS (INDOMETHACIN) 1 three times daily for 2 days , then 1twice daily for 2 days, then 1 daily  #15 x 0   Entered by:   Everitt Amber LPN   Authorized by:   Syliva Overman MD   Signed by:   Everitt Amber LPN on 29/56/2130   Method used:   Printed then faxed to ...       [facility]       [address]       [place], [place]  [address]       Ph: [phone] or [phone]       Fax: [phone]   RxID:   [id number] PREDNISONE (PAK) 5 MG TABS (PREDNISONE) Use as directed  #21 x 0   Entered by:   Everitt Amber LPN   Authorized by:   Syliva Overman MD   Signed by:   Everitt Amber LPN on 95/18/8416   Method used:   Printed then faxed to ...       [facility]ospital [facility] (retail)       [address]       [place], [place]  [address]       Ph: [phone] or [phone]       Fax: [phone]   RxID:   [id number]    Medication Administration  Injection # 1:    Medication: Depo- Medrol 80mg     Diagnosis: HEEL PAIN (ICD-729.5)    Route: IM    Site: RUOQ gluteus    Exp Date: 06/2010    Lot #: [id number]    Mfr: Pharmacia    Comments: 80mg  given     Patient tolerated injection without complications    Given by: Everitt Amber LPN (November 20, 2009 11:44 AM)  Injection # 2:    Medication: Ketorolac-Toradol 15mg     Diagnosis: HEEL PAIN (ICD-729.5)    Route: IM    Site: LUOQ  gluteus  Exp Date: 05/2011    Lot #: [id number]     Mfr: novaplus    Comments: 60mg   given     Patient tolerated injection without complications    Given by: Everitt Amber LPN (November 20, 2009 11:45 AM)  Orders Added: 1)  Est. Patient Level IV [99214] 2)  T-CBC w/Diff [[id number]] 3)  T-TSH [[id number]] 4)  Radiology Referral [Radiology] 5)  Depo- Medrol 80mg  [J1040] 6)  Ketorolac-Toradol 15mg  [J1885] 7)  Admin of Therapeutic Inj  intramuscular or subcutaneous [96372] 8)  ENT Referral [ENT]

## 2010-04-24 NOTE — Progress Notes (Signed)
Summary: speak with nurse   Phone Note Call from Patient   Summary of Call: please call pt back. speak with  nurse. [phone] Initial call taken by: Rudene Anda,  November 14, 2009 1:35 PM  Follow-up for Phone Call        having problem with psyciatrist- he has a new policy, and patient states she cannot afford to go there anymore, wants to know if dr simpson would be willing the prescribe the meds that she gets from him.  Follow-up by: Adella Hare LPN,  November 14, 2009 2:29 PM  Additional Follow-up for Phone Call Additional follow up Details #1::        will be addressed at next ov with her depending on her mental health state it is a possibility Additional Follow-up by: Syliva Overman MD,  November 15, 2009 4:54 AM    Additional Follow-up for Phone Call Additional follow up Details #2::    patient aware Follow-up by: Adella Hare LPN,  November 15, 2009 10:36 AM

## 2010-04-25 LAB — CONVERTED CEMR LAB
Albumin: 4.6 g/dL (ref 3.5–5.2)
Alkaline Phosphatase: 76 units/L (ref 39–117)
BUN: 11 mg/dL (ref 6–23)
CO2: 22 meq/L (ref 19–32)
Chloride: 101 meq/L (ref 96–112)
Creatinine, Ser: 1.07 mg/dL (ref 0.40–1.20)
Glucose, Bld: 111 mg/dL — ABNORMAL HIGH (ref 70–99)
Hgb A1c MFr Bld: 6 % — ABNORMAL HIGH (ref <5.7)
LDL Cholesterol: 99 mg/dL (ref 0–99)
Potassium: 3.6 meq/L (ref 3.5–5.3)
Total Bilirubin: 0.3 mg/dL (ref 0.3–1.2)
Total Protein: 7.7 g/dL (ref 6.0–8.3)
Triglycerides: 134 mg/dL (ref <150)
VLDL: 27 mg/dL (ref 0–40)

## 2010-04-26 NOTE — Letter (Signed)
Summary: ent  ent   Imported By: Lind Guest 04/04/2010 10:52:14  _____________________________________________________________________  External Attachment:    Type:   Image     Comment:   External Document

## 2010-04-26 NOTE — Progress Notes (Signed)
Summary: rx  Phone Note Call from Patient   Summary of Call: needs her medicine resent to pharm. they state they don't have it. [phone] Initial call taken by: Rudene Anda,  April 20, 2010 11:25 AM  Follow-up for Phone Call        meds were resent Follow-up by: Adella Hare LPN,  April 20, 2010 1:58 PM

## 2010-05-01 ENCOUNTER — Encounter (INDEPENDENT_AMBULATORY_CARE_PROVIDER_SITE_OTHER): Payer: 59 | Admitting: Psychiatry

## 2010-05-01 DIAGNOSIS — F39 Unspecified mood [affective] disorder: Secondary | ICD-10-CM

## 2010-05-02 DIAGNOSIS — J449 Chronic obstructive pulmonary disease, unspecified: Secondary | ICD-10-CM | POA: Insufficient documentation

## 2010-05-02 DIAGNOSIS — J4489 Other specified chronic obstructive pulmonary disease: Secondary | ICD-10-CM | POA: Insufficient documentation

## 2010-05-10 NOTE — Assessment & Plan Note (Signed)
Summary: office visit   Vital Signs:  Patient profile:   55 year old female Menstrual status:  hysterectomy Height:      63 inches Weight:      192.75 pounds BMI:     34.27 O2 Sat:      98 % on Room air Pulse rate:   101 / minute Pulse rhythm:   regular Resp:     16 per minute BP sitting:   100 / 70  (left arm)  Vitals Entered By: Adella Hare LPN (April 19, 2010 3:28 PM)  Nutrition Counseling: Patient's BMI is greater than 25 and therefore counseled on weight management options.  O2 Flow:  Room air CC: follow-up visit Is Patient Diabetic? No   Primary Care Provider:  Dr Lodema Hong   CC:  follow-up visit.  History of Present Illness: Reports  that she has been  doing well. Denies recent fever or chills. Denies sinus pressure, nasal congestion , ear pain or sore throat. Denies chest congestion, or cough productive of sputum. Denies chest pain, palpitations, PND, orthopnea or leg swelling. Denies abdominal pain, nausea, vomitting, diarrhea or constipation. Denies change in bowel movements or bloody stool. Denies dysuria , frequency, incontinence or hesitancy. Denies  joint pain, swelling, or reduced mobility. Denies headaches, vertigo, seizures. Denies uncontrolled depression, anxiety or insomnia.She follows with psychiatry regularly. Denies  rash, lesions, or itch. Her nicotine dependence is slightly improved.     Preventive Screening-Counseling & Management  Alcohol-Tobacco     Smoking Cessation Counseling: yes  Current Medications (verified): 1)  Flovent Hfa 220 Mcg/act Aero (Fluticasone Propionate  Hfa) .... Two Puffs in The Am and Two At Bedtime 2)  Crestor 40 Mg Tabs (Rosuvastatin Calcium) .... Take 1 Tablet By Mouth Once A Day 3)  Levothyroxine Sodium 50 Mcg Tabs (Levothyroxine Sodium) .... Take 1 Tablet By Mouth Once A Day Except On Sunday Take 1 1/2 Tabs 4)  Maxzide-25 37.5-25 Mg Tabs (Triamterene-Hctz) .... One Tab By Mouth Qd 5)  Arthrotec 50 50-200  Mg-Mcg Tabs (Diclofenac-Misoprostol) .... One Tab By Mouth Bid 6)  Amlodipine Besylate 10 Mg Tabs (Amlodipine Besylate) .... One Tab By Mouth Qd 7)  Cyclobenzaprine Hcl 10 Mg Tabs (Cyclobenzaprine Hcl) .... One Tab By Mouth Qhs 8)  Symbicort 160-4.5 Mcg/act Aero (Budesonide-Formoterol Fumarate) .... 2 Puffs Twice Daily 9)  Omeprazole 20 Mg Cpdr (Omeprazole) .... One Cap By Mouth Bid 10)  Trazodone Hcl 100 Mg Tabs (Trazodone Hcl) .... One Tab At Bedtime 11)  Zyprexa 10 Mg Tabs (Olanzapine) .... One Tab By Mouth Once Daily 12)  Lamotrigine 200 Mg Tabs (Lamotrigine) .... One Tab By Mouth Once Daily  Allergies (verified): 1)  ! Codeine  Social History: Disabled Single Current Smoker- 8 per day Regular exercise-yes  Review of Systems      See HPI General:  Complains of fatigue. Eyes:  Complains of vision loss-both eyes. Resp:  Complains of cough; chronic dry,mainly due to gERd, improved. Psych:  Complains of anxiety, easily angered, and mental problems; denies suicidal thoughts/plans, thoughts of violence, and unusual visions or sounds. Endo:  Complains of excessive thirst; denies cold intolerance, excessive hunger, excessive urination, and heat intolerance; still drinking alot of water regulalrly , but not as much as in the past. Heme:  Denies abnormal bruising and bleeding. Allergy:  Complains of seasonal allergies.  Physical Exam  General:  Well-developed,well-nourished,in no acute distress; alert,appropriate and cooperative throughout examination HEENT: No facial asymmetry,  EOMI, No sinus tenderness, TM's Clear, oropharynx  pink and moist.   Chest: Clear to auscultation bilaterally.  CVS: S1, S2, No murmurs, No S3.   Abd: Soft, Nontender.  MS: Adequate ROM spine, hips, shoulders and knees.  Ext: No edema.   CNS: CN 2-12 intact, power tone and sensation normal throughout.   Skin: Intact, no visible lesions or rashes.  Psych: Good eye contact, normal affect.  Memory intact, not  anxious or depressed appearing.    Impression & Recommendations:  Problem # 1:  HOARSENESS, CHRONIC (ICD-784.42) Assessment Improved pt has been evaluated and treated by ENT  Problem # 2:  NICOTINE ADDICTION (ICD-305.1) Assessment: Unchanged  Encouraged smoking cessation and discussed different methods for smoking cessation.   Problem # 3:  OBESITY, UNSPECIFIED (ICD-278.00) Assessment: Deteriorated  Ht: 63 (04/19/2010)   Wt: 192.75 (04/19/2010)   BMI: 34.27 (04/19/2010) therapeutic lifestyle change discussed and encouraged  Problem # 4:  UNSPECIFIED HYPOTHYROIDISM (ICD-244.9) Assessment: Comment Only  Her updated medication list for this problem includes:    Levothyroxine Sodium 50 Mcg Tabs (Levothyroxine sodium) .Marland Kitchen... Take 1 tablet by mouth once a day except on sunday take 1 1/2 tabs  Orders: T-TSH [id number])  Labs Reviewed: TSH: 1.840 (09/13/2009)    Chol: 167 (09/13/2009)   HDL: 51 (09/13/2009)   LDL: 95 (09/13/2009)   TG: 106 (09/13/2009)  Problem # 5:  ESSENTIAL HYPERTENSION (ICD-401.9) Assessment: Comment Only  Her updated medication list for this problem includes:    Maxzide-25 37.5-25 Mg Tabs (Triamterene-hctz) ..... One tab by mouth qd    Amlodipine Besylate 10 Mg Tabs (Amlodipine besylate) ..... One tab by mouth qd  BP today: 100/70, will consider dose reduction next vist, pt currently asymptomatic Prior BP: 128/70 (11/29/2009)  Labs Reviewed: K+: 4.0 (09/13/2009) Creat: : 0.91 (09/13/2009)   Chol: 167 (09/13/2009)   HDL: 51 (09/13/2009)   LDL: 95 (09/13/2009)   TG: 106 (09/13/2009)  Problem # 6:  ASTHMA (ICD-493.90) Assessment: Unchanged  The following medications were removed from the medication list:    Proair Hfa 108 (90 Base) Mcg/act Aers (Albuterol sulfate) ..... Use 2 puffs every 4 hrs as needed Her updated medication list for this problem includes:    Flovent Hfa 220 Mcg/act Aero (Fluticasone propionate  hfa) .Marland Kitchen..Marland Kitchen Two puffs in the am and two  at bedtime    Symbicort 160-4.5 Mcg/act Aero (Budesonide-formoterol fumarate) .Marland Kitchen... 2 puffs twice daily  Problem # 7:  COPD (ICD-496) Assessment: Unchanged  The following medications were removed from the medication list:    Proair Hfa 108 (90 Base) Mcg/act Aers (Albuterol sulfate) ..... Use 2 puffs every 4 hrs as needed Her updated medication list for this problem includes:    Flovent Hfa 220 Mcg/act Aero (Fluticasone propionate  hfa) .Marland Kitchen..Marland Kitchen Two puffs in the am and two at bedtime    Symbicort 160-4.5 Mcg/act Aero (Budesonide-formoterol fumarate) .Marland Kitchen... 2 puffs twice daily  Orders: Medicare Electronic Prescription [phone])  Complete Medication List: 1)  Flovent Hfa 220 Mcg/act Aero (Fluticasone propionate  hfa) .... Two puffs in the am and two at bedtime 2)  Crestor 40 Mg Tabs (Rosuvastatin calcium) .... Take 1 tablet by mouth once a day 3)  Levothyroxine Sodium 50 Mcg Tabs (Levothyroxine sodium) .... Take 1 tablet by mouth once a day except on sunday take 1 1/2 tabs 4)  Maxzide-25 37.5-25 Mg Tabs (Triamterene-hctz) .... One tab by mouth qd 5)  Arthrotec 50 50-200 Mg-mcg Tabs (Diclofenac-misoprostol) .... One tab by mouth bid 6)  Amlodipine Besylate 10 Mg Tabs (Amlodipine  besylate) .... One tab by mouth qd 7)  Cyclobenzaprine Hcl 10 Mg Tabs (Cyclobenzaprine hcl) .... One tab by mouth qhs 8)  Symbicort 160-4.5 Mcg/act Aero (Budesonide-formoterol fumarate) .... 2 puffs twice daily 9)  Omeprazole 20 Mg Cpdr (Omeprazole) .... One cap by mouth bid 10)  Trazodone Hcl 100 Mg Tabs (Trazodone hcl) .... One tab at bedtime 11)  Zyprexa 10 Mg Tabs (Olanzapine) .... One tab by mouth once daily 12)  Lamotrigine 200 Mg Tabs (Lamotrigine) .... One tab by mouth once daily  Other Orders: T-Basic Metabolic Panel [phone]) T-Lipid Profile [id number]) T-Hepatic Function [phone]) T- Hemoglobin A1C ([id number])  Patient Instructions: 1)  Please schedule a follow-up appointment in 3 months. 2)   Tobacco is very bad for your health and your loved ones! You Should stop smoking!. 3)  Stop Smoking Tips: Choose a Quit date. Cut down before the Quit date. decide what you will do as a substitute when you feel the urge to smoke(gum,toothpick,exercise). 4)  It is important that you exercise regularly at least 20 minutes 5 times a week. If you develop chest pain, have severe difficulty breathing, or feel very tired , stop exercising immediately and seek medical attention. 5)  You need to lose weight. Consider a lower calorie diet and regular exercise.  6)  BMP prior to visit, ICD-9:  fasting asap 7)  Hepatic Panel prior to visit, ICD-9: 8)  Lipid Panel prior to visit, ICD-9: 9)  TSH prior to visit, ICD-9: 10)  HbgA1C prior to visit, ICD-9: Prescriptions: SYMBICORT 160-4.5 MCG/ACT AERO (BUDESONIDE-FORMOTEROL FUMARATE) 2 puffs twice daily  #10.20 Each x 3   Entered by:   Adella Hare LPN   Authorized by:   Syliva Overman MD   Signed by:   Adella Hare LPN on 16/60/6301   Method used:   Faxed to ...       [facility]       [address]       [place], [place]  [address]       Ph: [phone] or [phone]       Fax: [phone]   RxID:   [id number] CYCLOBENZAPRINE HCL 10 MG TABS (CYCLOBENZAPRINE HCL) one tab by mouth qhs  #30 Each x 3   Entered by:   Adella Hare LPN   Authorized by:   Syliva Overman MD   Signed by:   Adella Hare LPN on 27/05/5007   Method used:   Faxed to ...       Hospital doctor (retail)       [address]       [place], [place]  [address]       Ph: [phone] or [phone]       Fax: [phone]   RxID:   [id number] AMLODIPINE BESYLATE 10 MG TABS (AMLODIPINE BESYLATE) one tab by mouth qd  #30 Each x 3   Entered by:   Adella Hare LPN   Authorized by:   Syliva Overman MD   Signed by:   Adella Hare LPN on 76/19/5093   Method used:   Faxed to ...       [facility]       [address]       [place], [place]  [address]       Ph: [phone]  or [phone]       Fax: [phone]   RxID:   [id number] ARTHROTEC 50 50-200 MG-MCG TABS (DICLOFENAC-MISOPROSTOL) one tab by mouth bid  #60 Each x 3   Entered by:   Adella Hare LPN   Authorized by:   Syliva Overman MD   Signed by:   Adella Hare LPN on 62/95/2841   Method used:   Faxed to ...       Hospital doctor (retail)       [address]       [place], [place]  [address]       Ph: [phone] or [phone]       Fax: [phone]   RxID:   [id number] LEVOTHYROXINE SODIUM 50 MCG TABS (LEVOTHYROXINE SODIUM) Take 1 tablet by mouth once a day except on sunday take 1 1/2 tabs  #32 Each x 3   Entered by:   Jaime Boothe LPN   Authorized by:   Yarel Rushlow MD   Signed by:   Jaime Boothe LPN on 04/20/2010   Method used:   Faxed to ...       [facility] (retail)       [address]       [place]       [place], [place]  [address]       Ph: [phone] or [phone]       Fax: [phone]   RxID:   [id number] CRESTOR 40 MG TABS (ROSUVASTATIN CALCIUM) Take 1 tablet by mouth once a day  #30 Each x 3   Entered by:   Jaime Boothe LPN   Authorized by:   Leilani Cespedes MD   Signed by:   Jaime Boothe LPN on 04/20/2010   Method used:   Faxed to ...       [facility] (retail)       [address]       [place]       [place], [place]  [address]       Ph: [phone] or [phone]       Fax: [phone]   RxID:   [id number] FLOVENT HFA 220 MCG/ACT AERO (FLUTICASONE PROPIONATE  HFA) two puffs in the am and two at bedtime  #12 Each x 3   Entered by:   Jaime Boothe LPN   Authorized by:   Annagrace Carr MD   Signed by:   Jaime Boothe LPN on 04/20/2010   Method used:   Faxed to ...       [facility] (retail)       [address]       [place]       [place],   [address]       Ph: [phone] or [phone]       Fax: [phone]   RxID:   [id number] SYMBICORT 160-4.5 MCG/ACT AERO (BUDESONIDE-FORMOTEROL FUMARATE) 2 puffs twice daily  #10.20 Each x 3   Entered by:   Jaime Boothe LPN   Authorized by:   Diamonique Ruedas MD   Signed by:   Jaime Boothe LPN on 04/19/2010   Method used:   Faxed to ...       [facility] (retail)       [address]       [place], [place]  [address]  Ph: [phone] or [phone]       Fax: [phone]   RxID:   [id number] CYCLOBENZAPRINE HCL 10 MG TABS (CYCLOBENZAPRINE HCL) one tab by mouth qhs  #30 Each x 3   Entered by:   Adella Hare LPN   Authorized by:   Syliva Overman MD   Signed by:   Adella Hare LPN on 44/03/270   Method used:   Faxed to ...       Hospital doctor (retail)       [address]       [place], [place]  [address]       Ph: [phone] or [phone]       Fax: [phone]   RxID:   [id number] AMLODIPINE BESYLATE 10 MG TABS (AMLODIPINE BESYLATE) one tab by mouth qd  #30 Each x 3   Entered by:   Adella Hare LPN   Authorized by:   Syliva Overman MD   Signed by:   Adella Hare LPN on 22/04/5425   Method used:   Faxed to ...       Hospital doctor (retail)       [address]       [place], [place]  [address]       Ph: [phone] or [phone]       Fax: [phone]   RxID:   [id number] ARTHROTEC 50 50-200 MG-MCG TABS (DICLOFENAC-MISOPROSTOL) one tab by mouth bid  #60 Each x 3   Entered by:   Adella Hare LPN   Authorized by:   Syliva Overman MD   Signed by:   Adella Hare LPN on 67/89/3810   Method used:   Faxed to ...       Hospital doctor (retail)       [address]       [place], [place]  [address]       Ph: [phone] or [phone]       Fax: [phone]    RxID:   [id number] LEVOTHYROXINE SODIUM 50 MCG TABS (LEVOTHYROXINE SODIUM) Take 1 tablet by mouth once a day except on sunday take 1 1/2 tabs  #32 Each x 3   Entered by:   Jaime Boothe LPN   Authorized by:   Randell Detter MD   Signed by:   Jaime Boothe LPN on 04/19/2010   Method used:   Faxed to ...       [facility] (retail)       [address]       [place]       [place], [place]  [address]       Ph: [phone] or [phone]       Fax: [phone]   RxID:   [id number] CRESTOR 40 MG TABS (ROSUVASTATIN CALCIUM) Take 1 tablet by mouth once a day  #30 Each x 3   Entered by:   Jaime Boothe LPN   Authorized by:   Silvano Garofano MD   Signed by:   Jaime Boothe LPN on 04/19/2010   Method used:   Faxed to ...       [facility] (retail)       [address]       [place], [place]  [address]  Ph: [phone] or [phone]       Fax: [phone]   RxID:   [id number] FLOVENT HFA 220 MCG/ACT AERO (FLUTICASONE PROPIONATE  HFA) two puffs in the am and two at bedtime  #12 Each x 3   Entered by:   Adella Hare LPN   Authorized by:   Syliva Overman MD   Signed by:   Adella Hare LPN on 40/34/7425   Method used:   Faxed to ...       Hospital doctor (retail)       [address]       [place], [place]  [address]       Ph: [phone] or [phone]       Fax: [phone]   RxID:   [id number]    Orders Added: 1)  Est. Patient Level IV [[address]] 2)  T-Basic Metabolic Panel [id number] 3)  T-Lipid Profile [80061-22930] 4)  T-Hepatic Function [80076-22960] 5)  T- Hemoglobin A1C [83036-23375] 6)  T-TSH [[address]-10626] 7)  Medicare Electronic Prescription [phone]

## 2010-05-29 ENCOUNTER — Encounter (INDEPENDENT_AMBULATORY_CARE_PROVIDER_SITE_OTHER): Payer: 59 | Admitting: Psychiatry

## 2010-05-29 DIAGNOSIS — F3189 Other bipolar disorder: Secondary | ICD-10-CM

## 2010-06-16 ENCOUNTER — Emergency Department (HOSPITAL_COMMUNITY)
Admission: EM | Admit: 2010-06-16 | Discharge: 2010-06-16 | Disposition: A | Discharge status: Home / Self Care | Payer: Medicare Other | Attending: Emergency Medicine | Admitting: Emergency Medicine

## 2010-06-16 DIAGNOSIS — S91009A Unspecified open wound, unspecified ankle, initial encounter: Secondary | ICD-10-CM | POA: Insufficient documentation

## 2010-06-16 DIAGNOSIS — S81009A Unspecified open wound, unspecified knee, initial encounter: Secondary | ICD-10-CM | POA: Insufficient documentation

## 2010-06-16 DIAGNOSIS — W2209XA Striking against other stationary object, initial encounter: Secondary | ICD-10-CM | POA: Insufficient documentation

## 2010-06-30 LAB — BLOOD GAS, ARTERIAL
FIO2: 0.21 %
pCO2 arterial: 40.3 mmHg (ref 35.0–45.0)
pH, Arterial: 7.391 (ref 7.350–7.400)

## 2010-07-16 ENCOUNTER — Encounter: Payer: Self-pay | Admitting: Family Medicine

## 2010-07-17 ENCOUNTER — Encounter: Payer: Self-pay | Admitting: Family Medicine

## 2010-07-18 ENCOUNTER — Encounter: Payer: Self-pay | Admitting: Family Medicine

## 2010-07-20 ENCOUNTER — Encounter: Payer: Self-pay | Admitting: Family Medicine

## 2010-07-20 ENCOUNTER — Ambulatory Visit (INDEPENDENT_AMBULATORY_CARE_PROVIDER_SITE_OTHER): Payer: Medicare Other | Admitting: Family Medicine

## 2010-07-20 VITALS — BP 120/80 | HR 101 | Resp 16 | Ht 63.0 in | Wt 176.0 lb

## 2010-07-20 DIAGNOSIS — E669 Obesity, unspecified: Secondary | ICD-10-CM

## 2010-07-20 DIAGNOSIS — R7303 Prediabetes: Secondary | ICD-10-CM

## 2010-07-20 DIAGNOSIS — J309 Allergic rhinitis, unspecified: Secondary | ICD-10-CM

## 2010-07-20 DIAGNOSIS — I10 Essential (primary) hypertension: Secondary | ICD-10-CM

## 2010-07-20 DIAGNOSIS — R7301 Impaired fasting glucose: Secondary | ICD-10-CM

## 2010-07-20 DIAGNOSIS — F411 Generalized anxiety disorder: Secondary | ICD-10-CM

## 2010-07-20 DIAGNOSIS — D489 Neoplasm of uncertain behavior, unspecified: Secondary | ICD-10-CM

## 2010-07-20 DIAGNOSIS — R7309 Other abnormal glucose: Secondary | ICD-10-CM

## 2010-07-20 DIAGNOSIS — E039 Hypothyroidism, unspecified: Secondary | ICD-10-CM

## 2010-07-20 MED ORDER — FLUTICASONE PROPIONATE 50 MCG/ACT NA SUSP: 1.0000 | Freq: Every day | NASAL | Status: DC

## 2010-07-20 NOTE — Patient Instructions (Addendum)
F/u in 4 months.  congrats on weight loss and exercise.  hBA1C , and tSH today.  A healthy diet is rich in fruit, vegetables and whole grains. Poultry fish, nuts and beans are a healthy choice for protein rather then red meat. A low sodium diet and drinking 64 ounces of water daily is generally recommended. Oils and sweet should be limited. Carbohydrates especially for those who are diabetic or overweight, should be limited to 34-45 gram per meal. It is important to eat on a regular schedule, at least 3 times daily. Snacks should be primarily fruits, vegetables or nuts. It is important that you exercise regularly at least 30 minutes 5 times a week. If you develop chest pain, have severe difficulty breathing, or feel very tired, stop exercising immediately and seek medical attention    Please think about quitting smoking.  This is very important for your health.  Consider setting a quit date, then cutting back or switching brands to prepare to stop.  Also think of the money you will save every day by not smoking.  Quick Tips to Quit Smoking: Fix a date i.e. keep a date in mind from when you would not touch a tobacco product to smoke  Keep yourself busy and block your mind with work loads or reading books or watching movies in malls where smoking is not allowed  Vanish off the things which reminds you about smoking for example match box, or your favorite lighter, or the pipe you used for smoking, or your favorite jeans and shirt with which you used to enjoy smoking, or the club where you used to do smoking  Try to avoid certain people places and incidences where and with whom smoking is a common factor to add on  Praise yourself with some token gifts from the money you saved by stopping smoking  Anti Smoking teams are there to help you. Join their programs  Anti-smoking Gums are there in many medical shops. Try them to quit smoking   Side-effects of Smoking: Disease caused by smoking cigarettes  are emphysema, bronchitis, heart failures  Premature death  Cancer is the major side effect of smoking  Heart attacks and strokes are the quick effects of smoking causing sudden death  Some smokers lives end up with limbs amputated  Breathing problem or fast breathing is another side effect of smoking  Due to more intakes of smokes, carbon mono-oxide goes into your brain and other muscles of the body which leads to swelling of the veins and blockage to the air passage to lungs  Carbon monoxide blocks blood vessels which leads to blockage in the flow of blood to different major body organs like heart lungs and thus leads to attacks and deaths  During pregnancy smoking is very harmful and leads to premature birth of the infant, spontaneous abortions, low weight of the infant during birth  Fat depositions to narrow and blocked blood vessels causing heart attacks  In many cases cigarette smoking caused infertility in men   You are referred for a colonoscopy, this is past due.

## 2010-07-20 NOTE — Progress Notes (Signed)
  Subjective:    Patient ID: Rebecca Rodgers, female    DOB: October 19, 1955, 55 y.o.   MRN: [id number]  HPI Pt experiencing uncontrolled allergy symptoms, wants nose spray.Excessive nasal stuffiness, with clear drainage, no fever, chills,ear pain or sore throat. She had a tubulovillous adenoma in 2009, needs colonoscopy Raymonda has started regular exercise and changed her diet, with an 18 pound weight loss in the past 4 months, which is excellent. Her nicotine use is down to 10/day, she wants to quit.She reports good mental health, sees both her therapist an psychiatrist regularly.   Review of Systems Denies recent fever or chills.  Denies chest congestion, productive cough or wheezing. Denies chest pains, palpitations, paroxysmal nocturnal dyspnea, orthopnea and leg swelling Denies abdominal pain, nausea, vomiting,diarrhea or constipation.  Denies rectal bleeding or change in bowel movement. Denies dysuria, frequency, hesitancy or incontinence. Denies joint pain, swelling and limitation in mobility. Denies headaches, seizure, numbness, or tingling. Denies depression, anxiety or insomnia. Denies skin break down or rash.        Objective:   Physical Exam Patient alert and oriented and in no Cardiopulmonary distress.  HEENT: No facial asymmetry, EOMI, no sinus tenderness, TM's clear, Oropharynx pink and moist.  Neck supple no adenopathy. Nasal erythema and edema Chest: Clear to auscultation bilaterally.Decreased ear entry bilaterally  CVS: S1, S2 no murmurs, no S3.  ABD: Soft non tender. Bowel sounds normal.  Ext: No edema  MS: Adequate ROM spine, shoulders, hips and knees.  Skin: Intact, no ulcerations or rash noted.  Psych: Good eye contact, normal affect. Memory intact not anxious or depressed appearing.  CNS: CN 2-12 intact, power, tone and sensation normal throughout.        Assessment & Plan:

## 2010-07-21 ENCOUNTER — Encounter: Payer: Self-pay | Admitting: Family Medicine

## 2010-07-21 DIAGNOSIS — J309 Allergic rhinitis, unspecified: Secondary | ICD-10-CM | POA: Insufficient documentation

## 2010-07-21 DIAGNOSIS — R7303 Prediabetes: Secondary | ICD-10-CM | POA: Insufficient documentation

## 2010-07-21 NOTE — Assessment & Plan Note (Signed)
Improved, applauded on lifestyle change and encouraged to continue

## 2010-07-21 NOTE — Assessment & Plan Note (Signed)
Improved, pt to continue care for this with psychiatry.

## 2010-07-21 NOTE — Assessment & Plan Note (Signed)
Hypertension:Controlled, no changes in medication.   

## 2010-07-21 NOTE — Assessment & Plan Note (Signed)
Deteriorated. Pt to start daily medication, ad avoid triggers as able. Unfortnately a lot of her symptoms are related to the weather.

## 2010-07-21 NOTE — Assessment & Plan Note (Signed)
Controlled based on recent lab data, no change in medication, rept tSH in the next several months

## 2010-07-24 ENCOUNTER — Encounter (HOSPITAL_COMMUNITY): Payer: 59 | Admitting: Psychiatry

## 2010-07-24 ENCOUNTER — Encounter: Payer: Self-pay | Admitting: Family Medicine

## 2010-07-26 ENCOUNTER — Telehealth: Payer: Self-pay | Admitting: Family Medicine

## 2010-07-26 ENCOUNTER — Encounter (INDEPENDENT_AMBULATORY_CARE_PROVIDER_SITE_OTHER): Payer: Medicare Other | Admitting: Psychiatry

## 2010-07-26 DIAGNOSIS — F3189 Other bipolar disorder: Secondary | ICD-10-CM

## 2010-07-27 NOTE — Telephone Encounter (Signed)
Patient aware to use only one

## 2010-08-06 ENCOUNTER — Other Ambulatory Visit: Payer: Self-pay

## 2010-08-06 MED ORDER — TRIAMTERENE-HCTZ 37.5-25 MG PO TABS: 1.0000 | ORAL_TABLET | Freq: Every day | ORAL | Status: DC

## 2010-08-07 NOTE — Procedures (Signed)
NAME:  Rebecca Rodgers, Rebecca Rodgers NO.:  [id number]   MEDICAL RECORD NO.:  [id number]          PATIENT TYPE:  OUT   LOCATION:  RESP                          FACILITY:  [facility]   PHYSICIAN:  Edward L. Juanetta Gosling, M.D.DATE OF BIRTH:  [birth year]   DATE OF PROCEDURE:  DATE OF DISCHARGE:  11/14/2008                            PULMONARY FUNCTION TEST   1. Spirometry is normal.  2. Lung volumes are normal.  3. DLCO is mildly reduced.  4. Arterial blood gas is normal.  5. This study does not confirm a diagnosis of asthma.      Edward L. Juanetta Gosling, M.D.  Electronically Signed     [facility]/MEDQ  D:  11/15/2008  T:  11/16/2008  Job:  [id number]   cc:   Dr. Lodema Hong

## 2010-08-10 NOTE — Op Note (Signed)
NAME:  Rebecca Rodgers, SHOR NO.:  [id number]   MEDICAL RECORD NO.:  [id number]          PATIENT TYPE:  INP   LOCATION:  A427                          FACILITY:  [facility]   PHYSICIAN:  Tilda Burrow, M.D. DATE OF BIRTH:  09/29/1955   DATE OF PROCEDURE:  03/14/2005  DATE OF DISCHARGE:                                 OPERATIVE REPORT   PREOPERATIVE DIAGNOSIS:  Endometrial carcinoma, grade 1.   POSTOPERATIVE DIAGNOSIS:  Endometrial carcinoma, grade 1.   PROCEDURE:  Total abdominal hysterectomy, bilateral salpingo-oophorectomy,  pelvic washings.   SURGEON:  Tilda Burrow, M.D.   ASSISTANTAnnabell Howells, R.N.   ANESTHESIA:  General.   COMPLICATIONS:  None.   FINDINGS:  Small uterus, mobile deep pelvis with no evidence of intra-  abdominal lesions.   DETAILS OF PROCEDURE:  The patient was taken to the operating room and  prepped and draped for lower abdominal surgery. Midline vertical incision  performed. Peritoneal cavity opened bluntly without difficulty and pelvic  washings obtained. Uterus was exposed by elevating the bowel, packing it  away, elevating with laparotomy tapes, and after two repackings, we were  able to get adequate exposure. Round ligaments were taken down with Bovie  cautery, infundibulopelvic ligament was isolated being careful to be sure  the ureters were well out of the way, and while maintaining close proximity  to the ovary, the infundibulopelvic ligaments were clamped, cut and suture  ligated on either side. Broad ligaments were skeletonized and then curved  Heaney clamps used to cross clamp the uterine vessels. The bladder had been  developed anteriorly. We marched down the upper and lower cardinal  ligaments, serially clamping, cutting and suture ligating using 0 chromic,  and then we reached the level of the cervix a stab incision of the anterior  cervical vaginal fornix was performed. Kocher clamp used to grasp the  vaginal apex, the  cervix amputated off the vaginal cuff. Aldrich stitches  were placed at each lateral vaginal angle with 0 chromic and then held under  traction. The cuff was closed with running locking 0 chromic. The cuff had  shown some tendency to oozing, but at the completion of the closure, it  appeared hemostatic. The pelvis was irrigated. Two-0 chromic used to over  sew the cuff with the bladder flap.   The pelvis was inspected again, confirmed as hemostatic, and upper abdomen  explored. The omentum was completely normal in appearance without  abnormality. There were no suspicious lesions on the bowel surfaces that  were readily visible.   The anterior peritoneum was closed with 2-0 chromic, the fascia with running  0 Vicryl, subcutaneous tissues approximated with interrupted 2-0 plain, and  staple closure of the skin completed the procedure. EBL 300 cc.      Tilda Burrow, M.D.  Electronically Signed     JVF/MEDQ  D:  03/14/2005  T:  03/15/2005  Job:  [id number]   cc:   [facility] Department

## 2010-08-10 NOTE — Op Note (Signed)
[place]. [facility]  Patient:    Rebecca Rodgers                         MRN: [id number] Proc. Date: 04/03/99 Adm. Date:  [id number] Attending:  Derrek Monaco                           Operative Report  PREOPERATIVE DIAGNOSIS:  Entrapment and neuropathy median nerve, right carpal tunnel.  POSTOPERATIVE DIAGNOSIS: Entrapment and neuropathy median nerve, right carpal tunnel.  OPERATION:  Release of right transverse carpal ligament.  SURGEON:  Katy Fitch. Sypher, Montez Hageman., M.D.  ASSISTANT:  ______ ______, P.A.  ANESTHESIA:  General by mask.  ANESTHESIOLOGIST:  Cliffton Asters. Ivin Booty, M.D.  INDICATIONS:  [name] is a 55 year old woman who has had a history of bilateral hand numbness.  She has a history of lengthy diagnostic studies, demonstrating significant entrapment and neuropathy of the median nerve with right carpal tunnel.  Due to her failure to respond to nonoperative measures, she is brought to the operating room at this time for release of her transverse carpal  ligament.  DESCRIPTION OF PROCEDURE:  [name] is brought to the operating room and placed in the supine position upon the operating table.  Following the induction of general anesthesia the right arm was prepped with Betadine soap solution and sterilely draped.  Following exsanguination of the limb with Esmarch bandage, a 2.0 tourniquet was inflated to 220 mmHg.  The procedure commenced with a short incision in the line of the ring finger and the palm.  Subcutaneous tissues were carefully divided revealing the palmar fascia.  This was brought down superiorly revealing the ______ ______ of the median nerve.  These were followed by transverse carpal ligament, which was carefully lysated from the median nerve.  No masses or other predicaments were noted.  Ligament ports along the margin of the released ligament were electrocauterized  with bipolar current.  The wound was  inspected for masses and other predicaments, and none were identified.  The wound was then repaired with intradermal 3.0 Prolene suture.  sterile compressive dressing was applied with a volar plantar splint; obtaining  less than 5 degrees of dorsiflexion. DD:  04/03/99 TD:  04/03/99 Job: [id number]

## 2010-08-10 NOTE — Discharge Summary (Signed)
NAME:  Rebecca Rodgers, Rebecca Rodgers NO.:  [id number]   MEDICAL RECORD NO.:  [id number]          PATIENT TYPE:  INP   LOCATION:  A427                          FACILITY:  [facility]   PHYSICIAN:  Tilda Burrow, M.D. DATE OF BIRTH:  10-Apr-1955   DATE OF ADMISSION:  03/14/2005  DATE OF DISCHARGE:  12/24/2006LH                                 DISCHARGE SUMMARY   ADDITIONAL DIAGNOSIS:  Well differentiated adenocarcinoma of the  endometrium, surrounded by a background of atypical and complex hyperplasia  of the endometrium.   DISCHARGE DIAGNOSIS:  Grade 1A adenocarcinoma of the endometrium.   PROCEDURES:  Total abdominal hysterectomy, bilateral salpingo-oophorectomy,  pelvic washings by Jannifer Franklin March 14, 2005.   DISCHARGE MEDICATIONS:  1.  Tylox 1-2 q.6 h p.r.n. pain.  2.  Motrin 800 mg one q.8 h p.r.n. pain.   CURRENT MEDICATIONS:  1.  Synthroid 0.25 mg p.o. daily.  2.  Lithium ER 300 mg p.o. b.i.d.  3.  Prozac 20 mg p.o. b.i.d.  4.  Trazodone 150 mg p.o. q.h.s.  5.  Valium 10 mg p.r.n. q.6 h.  6.  Prilosec 20 mg p.o. b.i.d.  7.  Atenolol 50 mg p.o. daily.   HOSPITAL SUMMARY:  This 55 year old female, gravida 0, para 0, referred from  [facility] Department for evaluation and surgical treatment,  is admitted after endometrial biopsy there.  Showed atypical complex  hyperplasia with adenocarcinoma also noted.  The patient is a gravida 0,  para 0 with no history of efforts of child bearing.   HOSPITAL COURSE:  The patient was admitted with her life partner having  helped her with communications proceeding the surgery.  We underwent TAH BSO  pelvic washings March 14, 2005.  EBL was approximately 300 cc.  She did  well postoperatively.  The pathology report showed grade 1, adenocarcinoma  of the endometrium with only one area where there was any invasion of the  endometrium of the myometrium at all.  A spot where it showed 0.2 to 0.3 cm  invasion where  the myometrium was 0.7 to 0.8 cm in thickness.  There was no  vascular or lymphatic involvement.  Peroneal washings were negative for  malignant cells.  The patient did well postoperatively,  was in the hospital for three days, was discharged home with a postoperative  hemoglobin of 11.0 compared to admit preop hemoglobin of 12.5.  She was  afebrile, discharged home and will be followed up in three days for staple  removal and then again in four weeks for final postoperative care.      Tilda Burrow, M.D.  Electronically Signed     JVF/MEDQ  D:  03/28/2005  T:  03/29/2005  Job:  [id number]   cc:   [facility] Department

## 2010-08-10 NOTE — Discharge Summary (Signed)
NAME:  Rebecca Rodgers, Rebecca Rodgers NO.:  [id number]   MEDICAL RECORD NO.:  [id number]          PATIENT TYPE:  IPS   LOCATION:  0303                          FACILITY:  [facility]   PHYSICIAN:  Geoffery Lyons, M.D.      DATE OF BIRTH:  1955/10/27   DATE OF ADMISSION:  09/09/2007  DATE OF DISCHARGE:  09/15/2007                               DISCHARGE SUMMARY   CHIEF COMPLAINT/PRESENTING ILLNESS:  This was the first admission to  [facility] for this 55 year old female involuntarily  committed. History of depression, suicidal ideas.  Claimed that did not  want to her parents to go before her. Decreased sleep. Drinking six  beers daily for 4 years to calm down.  Endorsed mood swings. History of  racing thoughts. Both parents are ill, father has colon cancer and  mother recently had surgery for fracture. Was depressed, suicidal and  did walk into traffic.   PAST PSYCHIATRIC HISTORY:  Has been diagnosed bipolar. Has a therapist  through [place].   SECONDARY HISTORY:  As already stated, persistent use of alcohol, remote  use of marijuana.   MEDICAL HISTORY:  Back pain and hypertension.   MEDICATIONS:  1. Prozac 40 mg per day.  2. Xanax 0.5 mg per day.  3. Hydroxyzine 25 mg per day.  4. Arthrotek 50 mg twice a day.  5. Omeprazole 20 mg per day.  6. Crestor 40 mg per day.  7. Amitriptyline 25 mg per day.  8. Trazodone 150 mg at night.   PHYSICAL EXAMINATION:  Failed to show any acute findings.   LABORATORY WORKUP:  SGOT 50, SGPT 58, total bilirubin 0.8, TSH 3.161.   INITIAL MENTAL STATUS EXAM:  Reveals alert, cooperative female.  Mood  depressed.  Affect depressed.  Thought processes logical, coherent and  relevant.  Endorses sense of hopelessness, helplessness, suicidal  ideations with plan. Overwhelmed. No homicidal ideas, no hallucinations.  Cognition well-preserved.   ADMITTING DIAGNOSES:  AXIS I: Bipolar disorder depressed.  Alcohol  abuse, rule out  dependence.  AXIS II:  No diagnosis.  AXIS III:  Back pain and hypertension, gastroesophageal reflux,  hypercholesterolemia.  AXIS IV: Moderate.  AXIS V:  Upon admission 35, highest GAF in the last year 60.   COURSE IN THE HOSPITAL:  She was admitted.  She was started individual  and group psychotherapy.  We resumed her medications.  We started the  detoxing with Librium.  Eventually, she was placed on Depakote. As  already stated, she claimed that she just snapped. She was trying to  help her mother, feeling that her mother and father were sick and they  were going to die.  She did not want them to die first. So she said she  started thinking that she wanted to kill herself. Father with colon  cancer, going blind and a had a stroke.  Mother had surgery of her  knees, fractured leg and she herself was diagnosed with cancer of the  uterus 2 years prior to this admission.  Being seen at [facility]. Had been on  Prozac and lithium. Endorsed mood  swings and becomes hyperactive, cannot stop herself. Goes faster. No  sleep. Racing thoughts, then will get depressed with no energy, no  motivation, wanted to sleep all the time. Decreased appetite. As already  stated, alcohol 12 ounce beer, six a day for the last 4 years. Had a  prior history of cocaine and marijuana when in [place], not recently.  June 19, she was endorsing that she was having a very hard time. She had  decreased and discontinued the lithium due to tremors.  She was off the  lithium. Very overwhelmed with everything that was going on in her life.  Her parents sick, conflict with the girlfriend, increased agitation,  impulsivity, suicidal ideas, ruminating about wanting to be dead before  her parents died.  We pursued the Depakote that she tolerated quite  well.  June 22, she continued to improve.  Endorsed she was wanting to  get her life back together. She had addressed some of her issues, some   of her concerns. Felt that her mother and father had gotten the message  that she would not be able to do everything that she was doing for them  before. On June 23, was in full contact reality.  Mood improved.  Affect  brighter.  Brother no suicidal or homicidal ideas, no hallucinations or  delusions.  She was on a mood stabilizer, feeling better.  Committed to  abstinence.   DISCHARGE DIAGNOSES:  AXIS I: Alcohol dependence.  Mood bipolar disorder  depressed.  AXIS II: No diagnosis.  AXIS III:  Back pain, hypertension, acid reflux, diskogenic back  disease.  AXIS IV: Moderate.  AXIS V:  Upon discharge 50, 55.   Discharged on Ventolin two puffs four times a day, Vistaril 25 one in  the morning, Norvasc 5 mg per day, Protonix 40 mg per day, trazodone 150  at night, Prozac 40 mg per day, Depakote 250 one twice a day and 500 at  bedtime, Arthrotec 50 mg twice a day and instructed not to take anymore  Xanax or Elavil.  Follow-up [facility].      Geoffery Lyons, M.D.  Electronically Signed     IL/MEDQ  D:  10/14/2007  T:  10/14/2007  Job:  [id number]

## 2010-08-10 NOTE — H&P (Signed)
NAME:  Rebecca Rodgers, MARCEL NO.:  [id number]   MEDICAL RECORD NO.:  [id number]          PATIENT TYPE:  AMB   LOCATION:  DAY                           FACILITY:  [facility]   PHYSICIAN:  Tilda Burrow, M.D. DATE OF BIRTH:  21-Jul-1955   DATE OF ADMISSION:  DATE OF DISCHARGE:  LH                                HISTORY & PHYSICAL   ADMISSION DIAGNOSIS:  Well differentiated adenocarcinoma of the endometrium  in a background of atypical complex hyperplasia.   HISTORY OF PRESENT ILLNESS:  This 55 year old female gravida 0, para 0, with  regular menses is referred from [facility] Department due to  an evaluation there, which showed an endometrial biopsy showing well  differentiated adenocarcinoma of the endometrium in the background of  atypical complex hyperplasia.  The patient had a pap smear, which showed  endometrial cells present on the pap smear.  She, therefore, had  endocervical curettage, which was normal, and had an endometrial biopsy,  which showed well differentiated adenocarcinoma of the endometrium in a  background of atypical complex hyperplasia.  She has been recommended to  have a hysterectomy.  The patient has been explained that ovarian removal is  considered the course of the procedure with pelvic washing to be performed  at the time to ensure no evidence of adjacent intra-abdominal dissemination  of cells.  No plans for lymph node dissection are made at this time.   PAST MEDICAL HISTORY:  Positive for:  1.  Bipolar disorder.  2.  GERD.  3.  Arthritis in the right shoulder.   SURGICAL HISTORY:  Carpal tunnel surgery many years ago.   MEDICATIONS:  1.  Omeprazole 20 mg two tablets daily.  2.  Trazodone 150 mg p.o. q.h.s. for sleep.  3.  Atenolol 50 mg p.o. daily.  4.  Prozac 20 mg p.o. daily.  5.  Lithobid 300 mg two tablets q.h.s.   EXAMINATION:  GENERAL:  Shows a cheerful African American female with some  hearing difficulties.  She can  understand when you speak directly to her.  She lip reads to improve her communication.  She is accompanied by her  partner.  HEENT:  Pupils equal, round, and reactive.  NECK:  Supple.  CARDIOVASCULAR:  Unremarkable.  ABDOMEN:  Moderate obesity.  GENITALIA:  External genitalia nulliparous.  Uterus anterior and within  normal limits in mobility.  The cervix is grossly normal.  A recent ECC was  negative.  Pap smear normal except for the glandular cells.  EXTREMITIES:  Grossly normal.  VITAL SIGNS:  Weight 185, blood pressure 130/70.   PLAN:  TAH and BSO with pelvic washings on March 14, 2005.      Tilda Burrow, M.D.  Electronically Signed     JVF/MEDQ  D:  03/12/2005  T:  03/12/2005  Job:  [id number]

## 2010-08-27 ENCOUNTER — Other Ambulatory Visit: Payer: Self-pay | Admitting: Family Medicine

## 2010-09-11 ENCOUNTER — Other Ambulatory Visit: Payer: Self-pay | Admitting: Family Medicine

## 2010-09-24 ENCOUNTER — Other Ambulatory Visit: Payer: Self-pay | Admitting: Family Medicine

## 2010-09-25 ENCOUNTER — Encounter (INDEPENDENT_AMBULATORY_CARE_PROVIDER_SITE_OTHER): Payer: Medicare Other | Admitting: Psychiatry

## 2010-09-25 ENCOUNTER — Encounter (HOSPITAL_COMMUNITY): Payer: Medicare Other | Admitting: Psychiatry

## 2010-09-25 DIAGNOSIS — F3189 Other bipolar disorder: Secondary | ICD-10-CM

## 2010-09-27 ENCOUNTER — Ambulatory Visit (INDEPENDENT_AMBULATORY_CARE_PROVIDER_SITE_OTHER): Payer: Medicare Other | Admitting: Otolaryngology

## 2010-09-27 DIAGNOSIS — K219 Gastro-esophageal reflux disease without esophagitis: Secondary | ICD-10-CM

## 2010-09-27 DIAGNOSIS — J310 Chronic rhinitis: Secondary | ICD-10-CM

## 2010-11-13 ENCOUNTER — Other Ambulatory Visit: Payer: Self-pay | Admitting: Family Medicine

## 2010-11-19 ENCOUNTER — Ambulatory Visit: Payer: Medicare Other | Admitting: Family Medicine

## 2010-11-27 ENCOUNTER — Encounter: Payer: Self-pay | Admitting: Family Medicine

## 2010-11-27 ENCOUNTER — Encounter (HOSPITAL_COMMUNITY): Payer: Medicare Other | Admitting: Psychiatry

## 2010-11-28 ENCOUNTER — Encounter: Payer: Self-pay | Admitting: Family Medicine

## 2010-11-28 ENCOUNTER — Ambulatory Visit (INDEPENDENT_AMBULATORY_CARE_PROVIDER_SITE_OTHER): Payer: Medicare Other | Admitting: Family Medicine

## 2010-11-28 VITALS — BP 110/82 | HR 84 | Resp 16 | Ht 63.0 in | Wt 175.0 lb

## 2010-11-28 DIAGNOSIS — J45909 Unspecified asthma, uncomplicated: Secondary | ICD-10-CM

## 2010-11-28 DIAGNOSIS — Z79899 Other long term (current) drug therapy: Secondary | ICD-10-CM

## 2010-11-28 DIAGNOSIS — R7309 Other abnormal glucose: Secondary | ICD-10-CM

## 2010-11-28 DIAGNOSIS — R5381 Other malaise: Secondary | ICD-10-CM

## 2010-11-28 DIAGNOSIS — F17200 Nicotine dependence, unspecified, uncomplicated: Secondary | ICD-10-CM

## 2010-11-28 DIAGNOSIS — R7303 Prediabetes: Secondary | ICD-10-CM

## 2010-11-28 DIAGNOSIS — F411 Generalized anxiety disorder: Secondary | ICD-10-CM

## 2010-11-28 DIAGNOSIS — I10 Essential (primary) hypertension: Secondary | ICD-10-CM

## 2010-11-28 DIAGNOSIS — E039 Hypothyroidism, unspecified: Secondary | ICD-10-CM

## 2010-11-28 DIAGNOSIS — M949 Disorder of cartilage, unspecified: Secondary | ICD-10-CM

## 2010-11-28 DIAGNOSIS — J309 Allergic rhinitis, unspecified: Secondary | ICD-10-CM

## 2010-11-28 DIAGNOSIS — M899 Disorder of bone, unspecified: Secondary | ICD-10-CM

## 2010-11-28 DIAGNOSIS — R5383 Other fatigue: Secondary | ICD-10-CM

## 2010-11-28 DIAGNOSIS — Z23 Encounter for immunization: Secondary | ICD-10-CM

## 2010-11-28 MED ORDER — INFLUENZA VAC TYPES A & B PF IM SUSP: 0.5000 mL | Freq: Once | INTRAMUSCULAR | Status: DC

## 2010-11-28 NOTE — Patient Instructions (Addendum)
CPE in 3 .5 months   Fasting labs as soon as possible  You need to reduce nicotine use with a plan to quit  Mammogram is due , we will schedule  Flu vaccine today

## 2010-11-29 ENCOUNTER — Encounter (INDEPENDENT_AMBULATORY_CARE_PROVIDER_SITE_OTHER): Payer: Medicare Other | Admitting: Psychiatry

## 2010-11-29 DIAGNOSIS — F3189 Other bipolar disorder: Secondary | ICD-10-CM

## 2010-12-07 LAB — CBC WITH DIFFERENTIAL/PLATELET
Eosinophils Relative: 1 % (ref 0–5)
HCT: 38.5 % (ref 36.0–46.0)
Lymphocytes Relative: 23 % (ref 12–46)
Lymphs Abs: 2 10*3/uL (ref 0.7–4.0)
MCV: 90 fL (ref 78.0–100.0)
Platelets: 389 10*3/uL (ref 150–400)
RBC: 4.28 MIL/uL (ref 3.87–5.11)
WBC: 8.6 10*3/uL (ref 4.0–10.5)

## 2010-12-07 LAB — BASIC METABOLIC PANEL
BUN: 21 mg/dL (ref 6–23)
CO2: 23 mEq/L (ref 19–32)
Calcium: 9.3 mg/dL (ref 8.4–10.5)
Creat: 1.19 mg/dL — ABNORMAL HIGH (ref 0.50–1.10)
Glucose, Bld: 94 mg/dL (ref 70–99)

## 2010-12-07 LAB — HEPATIC FUNCTION PANEL
AST: 16 U/L (ref 0–37)
Alkaline Phosphatase: 73 U/L (ref 39–117)
Bilirubin, Direct: 0.1 mg/dL (ref 0.0–0.3)
Total Bilirubin: 0.3 mg/dL (ref 0.3–1.2)

## 2010-12-07 LAB — HEMOGLOBIN A1C: Hgb A1c MFr Bld: 6.1 % — ABNORMAL HIGH (ref <5.7)

## 2010-12-07 NOTE — Progress Notes (Signed)
Patient aware.

## 2010-12-09 NOTE — Assessment & Plan Note (Signed)
Unchanged, counseled to quit 

## 2010-12-09 NOTE — Progress Notes (Signed)
  Subjective:    Patient ID: Rebecca Rodgers, female    DOB: January 28, 1956, 55 y.o.   MRN: [id number]  HPI The PT is here for follow up and re-evaluation of chronic medical conditions, medication management and review of any available recent lab and radiology data.  Preventive health is updated, specifically  Cancer screening and Immunization.   Questions or concerns regarding consultations or procedures which the PT has had in the interim are  addressed. The PT denies any adverse reactions to current medications since the last visit.  Rebecca Rodgers has been working on lifestyle change as far as diet and physical activity are concerned, and has lost weight and improved her HBa1C. Nicotine continues to be a challenge. Verbalizes a lot of stress at home with her partner of 10 years, who is an alcoholic.       Review of Systems See HPI Denies recent fever or chills. Denies sinus pressure, nasal congestion, ear pain or sore throat. Denies chest congestion, productive cough or wheezing. Denies chest pains, palpitations and leg swelling Denies abdominal pain, nausea, vomiting,diarrhea or constipation.   Denies dysuria, frequency, hesitancy or incontinence. Denies joint pain, swelling and limitation in mobility. Denies headaches, seizures, numbness, or tingling. Denies uncontrolled  depression, anxiety or insomnia. Denies skin break down or rash.        Objective:   Physical Exam Patient alert and oriented and in no cardiopulmonary distress.  HEENT: No facial asymmetry, EOMI, no sinus tenderness,  oropharynx pink and moist.  Neck supple no adenopathy.  Chest: Clear to auscultation bilaterally.Decreased air entry throughout,  CVS: S1, S2 no murmurs, no S3.  ABD: Soft non tender. Bowel sounds normal.  Ext: No edema  MS: Adequate ROM spine, shoulders, hips and knees.  Skin: Intact, no ulcerations or rash noted.  Psych: Good eye contact, normal affect. Memory intact , mildly anxious or  depressed appearing.  CNS: CN 2-12 intact, power, tone and sensation normal throughout.        Assessment & Plan:

## 2010-12-09 NOTE — Assessment & Plan Note (Signed)
Controlled, no change in medication Increased symptoms expected with onset of Fall

## 2010-12-09 NOTE — Assessment & Plan Note (Signed)
Controlled, no change in medication  

## 2010-12-09 NOTE — Assessment & Plan Note (Signed)
Improved with dietary change and weight loss

## 2010-12-09 NOTE — Assessment & Plan Note (Signed)
Improved, no change in medication, followed by psychiatry

## 2010-12-11 ENCOUNTER — Other Ambulatory Visit: Payer: Self-pay | Admitting: Family Medicine

## 2010-12-17 ENCOUNTER — Other Ambulatory Visit: Payer: Self-pay | Admitting: Family Medicine

## 2010-12-18 ENCOUNTER — Other Ambulatory Visit: Payer: Self-pay | Admitting: Family Medicine

## 2010-12-18 DIAGNOSIS — Z139 Encounter for screening, unspecified: Secondary | ICD-10-CM

## 2010-12-19 MED ORDER — SODIUM CHLORIDE 0.45 % IV SOLN
Freq: Once | INTRAVENOUS | Status: AC
  Administered 2010-12-20: 10:00:00 via INTRAVENOUS

## 2010-12-20 ENCOUNTER — Other Ambulatory Visit (INDEPENDENT_AMBULATORY_CARE_PROVIDER_SITE_OTHER): Payer: Self-pay | Admitting: Internal Medicine

## 2010-12-20 ENCOUNTER — Encounter (INDEPENDENT_AMBULATORY_CARE_PROVIDER_SITE_OTHER): Payer: Medicare Other | Admitting: Internal Medicine

## 2010-12-20 ENCOUNTER — Ambulatory Visit (HOSPITAL_COMMUNITY)
Admission: RE | Admit: 2010-12-20 | Discharge: 2010-12-20 | Disposition: A | Discharge status: Home / Self Care | Payer: Medicare Other | Source: Ambulatory Visit | Attending: Internal Medicine | Admitting: Internal Medicine

## 2010-12-20 ENCOUNTER — Encounter (HOSPITAL_COMMUNITY)
Admission: RE | Disposition: A | Discharge status: Home / Self Care | Payer: Self-pay | Source: Ambulatory Visit | Attending: Internal Medicine

## 2010-12-20 DIAGNOSIS — D126 Benign neoplasm of colon, unspecified: Secondary | ICD-10-CM | POA: Insufficient documentation

## 2010-12-20 DIAGNOSIS — Z860100 Personal history of colon polyps, unspecified: Secondary | ICD-10-CM | POA: Insufficient documentation

## 2010-12-20 DIAGNOSIS — I10 Essential (primary) hypertension: Secondary | ICD-10-CM | POA: Insufficient documentation

## 2010-12-20 DIAGNOSIS — K5730 Diverticulosis of large intestine without perforation or abscess without bleeding: Secondary | ICD-10-CM | POA: Insufficient documentation

## 2010-12-20 DIAGNOSIS — K648 Other hemorrhoids: Secondary | ICD-10-CM | POA: Insufficient documentation

## 2010-12-20 DIAGNOSIS — Z1211 Encounter for screening for malignant neoplasm of colon: Secondary | ICD-10-CM

## 2010-12-20 DIAGNOSIS — Z09 Encounter for follow-up examination after completed treatment for conditions other than malignant neoplasm: Secondary | ICD-10-CM | POA: Insufficient documentation

## 2010-12-20 DIAGNOSIS — K644 Residual hemorrhoidal skin tags: Secondary | ICD-10-CM

## 2010-12-20 DIAGNOSIS — Z800 Family history of malignant neoplasm of digestive organs: Secondary | ICD-10-CM

## 2010-12-20 DIAGNOSIS — E785 Hyperlipidemia, unspecified: Secondary | ICD-10-CM | POA: Insufficient documentation

## 2010-12-20 DIAGNOSIS — Z79899 Other long term (current) drug therapy: Secondary | ICD-10-CM | POA: Insufficient documentation

## 2010-12-20 HISTORY — PX: COLONOSCOPY: SHX5424

## 2010-12-20 LAB — RAPID URINE DRUG SCREEN, HOSP PERFORMED
Opiates: NOT DETECTED
Tetrahydrocannabinol: NOT DETECTED

## 2010-12-20 LAB — TSH: TSH: 3.161

## 2010-12-20 LAB — BASIC METABOLIC PANEL
Chloride: 108
GFR calc Af Amer: >60
Potassium: 3.5
Sodium: 139

## 2010-12-20 LAB — CBC
HCT: 33.1 — ABNORMAL LOW
Hemoglobin: 11 — ABNORMAL LOW
MCV: 89.1
RBC: 3.72 — ABNORMAL LOW
WBC: 8.3

## 2010-12-20 LAB — DIFFERENTIAL
Eosinophils Absolute: 0.1
Eosinophils Relative: 1
Lymphs Abs: 2.8
Monocytes Relative: 5

## 2010-12-20 LAB — HEPATIC FUNCTION PANEL
Alkaline Phosphatase: 84
Indirect Bilirubin: 0.7
Total Bilirubin: 0.8

## 2010-12-20 SURGERY — COLONOSCOPY
Anesthesia: Moderate Sedation

## 2010-12-20 MED ORDER — MIDAZOLAM HCL 5 MG/5ML IJ SOLN
INTRAMUSCULAR | Status: DC | PRN
  Administered 2010-12-20 (×3): 2 mg via INTRAVENOUS

## 2010-12-20 MED ORDER — MEPERIDINE HCL 50 MG/ML IJ SOLN
INTRAMUSCULAR | Status: AC
  Filled 2010-12-20: qty 1

## 2010-12-20 MED ORDER — MEPERIDINE HCL 50 MG/ML IJ SOLN
INTRAMUSCULAR | Status: DC | PRN
  Administered 2010-12-20 (×2): 25 mg

## 2010-12-20 MED ORDER — MIDAZOLAM HCL 5 MG/5ML IJ SOLN
INTRAMUSCULAR | Status: AC
  Filled 2010-12-20: qty 10

## 2010-12-20 NOTE — Op Note (Signed)
COLONOSCOPY PROCEDURE REPORT  PATIENT:  Rebecca Rodgers  MR#:  [id number] Birthdate:  1955-12-14, 55 y.o., female Endoscopist:  Dr. Malissa Hippo, MD Referred By:  Dr. Syliva Overman MD Procedure Date: 12/20/2010  Procedure:   Colonoscopy  Indications: History of colonic polyps. Patient had 20 mm adenoma removed in January 2009.  Informed Consent:  Procedure and risks were reviewed with the patient and informed consent was obtained. Medications:  Demerol 50 mg IV Versed 6 mg IV  Description of procedure:  After a digital rectal exam was performed, that colonoscope was advanced from the anus through the rectum and colon to the area of the cecum, ileocecal valve and appendiceal orifice. The cecum was deeply intubated. These structures were well-seen and photographed for the record. From the level of the cecum and ileocecal valve, the scope was slowly and cautiously withdrawn. The mucosal surfaces were carefully surveyed utilizing scope tip to flexion to facilitate fold flattening as needed. The scope was pulled down into the rectum where a thorough exam including retroflexion was performed.  Findings:   Preparation excellent. Single diverticulum at splenic flexure 2 small polyps at proximal sigmoid colon ablated via cold biopsy and submitted in one container. 5 mm polyp at distal sigmoid colon ablated via cold biopsy. Small hemorrhoids below the dentate line.  Therapeutic/Diagnostic Maneuvers Performed:  See  Complications:  None  Cecal Withdrawal Time:  13 minutes  Impression:  Examination performed to cecum. 3 small polyps ablated via cold biopsy from sigmoid colon. 2 from proximal sigmoid colon was submitted in one container. Single small diverticulum at splenic flexure. Small external hemorrhoids Recommendations:  Standard instructions given I will  be contacting patient with results of biopsy Colonoscopy in 5 years  Rebecca Rodgers U  12/20/2010 11:08 AM  CC: Dr.  Syliva Overman, MD, MD & Dr. Bonnetta Barry ref. provider found

## 2010-12-20 NOTE — H&P (Signed)
Rebecca Rodgers is an 55 y.o. female.   Chief Complaint: Patient is here for colonoscopy. HPI: Patient is 55 year old female with an adenoma removed from her sigmoid colon over 3 years ago. She denies abdominal pain rectal bleeding or change in her bowel habits. Family history is significant for colon carcinoma in her great grandfather who was in his 29s or so.  Past Medical History  Diagnosis Date  . Hypertension   . Hyperlipidemia   . Hypothyroidism   . Asthma   . Uterine cancer   . Hx of hysterectomy   . Suicide attempt 1997    when a relationship broke up    . Suicide attempt     3 months ago because of ill health of her parents     Past Surgical History  Procedure Date  . Vesicovaginal fistula closure w/ tah 2002    dur to uterine cancer   . Carpal tunnel release 1998    bilateral    . Dilation and curettage of uterus     at 20     Family History  Problem Relation Age of Onset  . Cataracts Mother   . Hypertension Mother   . Hyperlipidemia Mother   . Coronary artery disease Father   . Hyperlipidemia Father   . Stroke Father    Social History:  reports that she has been smoking Cigarettes.  She has been smoking about .5 packs per day. She does not have any smokeless tobacco history on file. She reports that she does not drink alcohol or use illicit drugs.  Allergies:  Allergies  Allergen Reactions  . Codeine   . Penicillins     Medications Prior to Admission  Medication Dose Route Frequency Provider Last Rate Last Dose  . 0.45 % sodium chloride infusion   Intravenous Once Malissa Hippo, MD 20 mL/hr at 12/20/10 0939    . meperidine (DEMEROL) 50 MG/ML injection           . midazolam (VERSED) 5 MG/5ML injection            Medications Prior to Admission  Medication Sig Dispense Refill  . FLEXERIL 10 MG tablet TAKE ONE TABLET AT BEDTIME  30 each  3  . lamoTRIgine (LAMICTAL) 200 MG tablet Take 200 mg by mouth daily.        Marland Kitchen OLANZapine (ZYPREXA) 5 MG tablet  Take 5 mg by mouth at bedtime.        . Omeprazole Magnesium 20.6 (20 BASE) MG CPDR Take by mouth 2 (two) times daily.        . traZODone (DESYREL) 100 MG tablet Take 100 mg by mouth at bedtime.          No results found for this or any previous visit (from the past 48 hour(s)). No results found.  Review of Systems  Constitutional: Negative for weight loss.  Gastrointestinal: Negative for abdominal pain, diarrhea, constipation, blood in stool and melena.    Blood pressure 123/76, pulse 79, temperature 98.3 F (36.8 C), temperature source Oral, resp. rate 12, height 5\' 3"  (1.6 m), weight 173 lb (78.472 kg), SpO2 96.00%. Physical Exam  Constitutional: She appears well-developed and well-nourished.  HENT:  Mouth/Throat: Oropharynx is clear and moist.  Eyes: Conjunctivae are normal. No scleral icterus.  Neck: No thyromegaly present.  Cardiovascular: Normal rate, regular rhythm and normal heart sounds.   No murmur heard. Respiratory: Effort normal and breath sounds normal.  GI: She exhibits no distension and no  mass. There is no tenderness.  Musculoskeletal: She exhibits edema.  Lymphadenopathy:    She has no cervical adenopathy.  Neurological: She is alert.  Skin: Skin is warm and dry.     Assessment/Plan History of colonic adenoma Surveillance colonoscopy   Jaymie Mckiddy U 12/20/2010, 10:36 AM

## 2010-12-27 ENCOUNTER — Ambulatory Visit (HOSPITAL_COMMUNITY)
Admission: RE | Admit: 2010-12-27 | Discharge: 2010-12-27 | Disposition: A | Discharge status: Home / Self Care | Payer: Medicare Other | Source: Ambulatory Visit | Attending: Family Medicine | Admitting: Family Medicine

## 2010-12-27 ENCOUNTER — Encounter (HOSPITAL_COMMUNITY): Payer: Self-pay | Admitting: Internal Medicine

## 2010-12-27 DIAGNOSIS — Z1231 Encounter for screening mammogram for malignant neoplasm of breast: Secondary | ICD-10-CM | POA: Insufficient documentation

## 2010-12-27 DIAGNOSIS — Z139 Encounter for screening, unspecified: Secondary | ICD-10-CM

## 2010-12-28 ENCOUNTER — Encounter (INDEPENDENT_AMBULATORY_CARE_PROVIDER_SITE_OTHER): Payer: Self-pay | Admitting: *Deleted

## 2011-01-09 ENCOUNTER — Other Ambulatory Visit: Payer: Self-pay | Admitting: Family Medicine

## 2011-01-11 ENCOUNTER — Other Ambulatory Visit: Payer: Self-pay | Admitting: Family Medicine

## 2011-01-24 ENCOUNTER — Encounter (INDEPENDENT_AMBULATORY_CARE_PROVIDER_SITE_OTHER): Payer: Medicare Other | Admitting: Psychiatry

## 2011-01-24 DIAGNOSIS — F3189 Other bipolar disorder: Secondary | ICD-10-CM

## 2011-02-25 ENCOUNTER — Other Ambulatory Visit (HOSPITAL_COMMUNITY): Payer: Self-pay | Admitting: Psychiatry

## 2011-03-25 ENCOUNTER — Encounter: Payer: Self-pay | Admitting: Family Medicine

## 2011-04-03 ENCOUNTER — Encounter: Payer: Self-pay | Admitting: Family Medicine

## 2011-04-04 ENCOUNTER — Encounter (HOSPITAL_COMMUNITY): Payer: Self-pay | Admitting: Psychiatry

## 2011-04-04 ENCOUNTER — Ambulatory Visit (INDEPENDENT_AMBULATORY_CARE_PROVIDER_SITE_OTHER): Payer: Medicare Other | Admitting: Psychiatry

## 2011-04-04 ENCOUNTER — Encounter (HOSPITAL_COMMUNITY): Payer: Medicare Other | Admitting: Psychiatry

## 2011-04-04 VITALS — Wt 177.0 lb

## 2011-04-04 DIAGNOSIS — F319 Bipolar disorder, unspecified: Secondary | ICD-10-CM

## 2011-04-04 MED ORDER — TRAZODONE HCL 100 MG PO TABS: 100.0000 mg | ORAL_TABLET | Freq: Every day | ORAL | Status: DC

## 2011-04-04 MED ORDER — OLANZAPINE 5 MG PO TABS: 5.0000 mg | ORAL_TABLET | Freq: Every day | ORAL | Status: DC

## 2011-04-04 MED ORDER — LAMOTRIGINE 200 MG PO TABS: 200.0000 mg | ORAL_TABLET | Freq: Every day | ORAL | Status: DC

## 2011-04-04 NOTE — Progress Notes (Signed)
Patient came for her followup appointment. She had a very good Christmas. She visited [place] with her sister and had a very good trip. She actually gained some weight during the holidays and she admitted that she was eating more than normal. She continued to have struggled with her mother who she sometimes calls the middle of the night. Her relationship with her partner remains same however recently she reported that Jimmey Ralph is not drinking anymore. She likes her current medication. She denies any agitation anger or mood swings. She has been not drinking to pass few months. She denies any tremors shakes or extrapyramidal side effects. She is scheduled to see her primary care physician Dr. Shon Hale and have blood work next Monday.  Mental status examination Patient is casually dressed and maintained fair eye contact. Her speech is soft clear and coherent. Her thought process logical linear and goal-directed. She denies any active or passive suicidal thoughts or homicidal thoughts. There no psychotic symptoms present. Her attention and concentration is okay. She's alert and oriented x3. Her insight judgment and pulse control is okay.  Assessment Bipolar disorder Alcohol abuse in partial remission  Plan I will continue her current medication which is Zyprexa 5 mg at bedtime Lamictal 200 mg daily and trazodone 100 mg at bedtime. At this time patient does not experience any side effects of medication. I will continue to monitor side effects and efficacy of these medication. I will review her progress ounce which will be done next week by her primary care physician. I will see her again in 3 months

## 2011-04-08 ENCOUNTER — Other Ambulatory Visit (HOSPITAL_COMMUNITY)
Admission: RE | Admit: 2011-04-08 | Discharge: 2011-04-08 | Disposition: A | Discharge status: Home / Self Care | Payer: Medicare Other | Source: Ambulatory Visit | Attending: Family Medicine | Admitting: Family Medicine

## 2011-04-08 ENCOUNTER — Encounter: Payer: Self-pay | Admitting: Family Medicine

## 2011-04-08 ENCOUNTER — Ambulatory Visit (INDEPENDENT_AMBULATORY_CARE_PROVIDER_SITE_OTHER): Payer: Medicare Other | Admitting: Family Medicine

## 2011-04-08 VITALS — BP 114/78 | HR 97 | Resp 16 | Ht 63.0 in | Wt 180.0 lb

## 2011-04-08 DIAGNOSIS — R05 Cough: Secondary | ICD-10-CM

## 2011-04-08 DIAGNOSIS — J42 Unspecified chronic bronchitis: Secondary | ICD-10-CM

## 2011-04-08 DIAGNOSIS — Z79899 Other long term (current) drug therapy: Secondary | ICD-10-CM

## 2011-04-08 DIAGNOSIS — Z1211 Encounter for screening for malignant neoplasm of colon: Secondary | ICD-10-CM

## 2011-04-08 DIAGNOSIS — R7301 Impaired fasting glucose: Secondary | ICD-10-CM

## 2011-04-08 DIAGNOSIS — R7303 Prediabetes: Secondary | ICD-10-CM

## 2011-04-08 DIAGNOSIS — Z01419 Encounter for gynecological examination (general) (routine) without abnormal findings: Secondary | ICD-10-CM | POA: Insufficient documentation

## 2011-04-08 DIAGNOSIS — IMO0002 Reserved for concepts with insufficient information to code with codable children: Secondary | ICD-10-CM

## 2011-04-08 DIAGNOSIS — I10 Essential (primary) hypertension: Secondary | ICD-10-CM

## 2011-04-08 DIAGNOSIS — R7309 Other abnormal glucose: Secondary | ICD-10-CM

## 2011-04-08 DIAGNOSIS — E039 Hypothyroidism, unspecified: Secondary | ICD-10-CM

## 2011-04-08 DIAGNOSIS — Z0000 Encounter for general adult medical examination without abnormal findings: Secondary | ICD-10-CM

## 2011-04-08 DIAGNOSIS — E669 Obesity, unspecified: Secondary | ICD-10-CM

## 2011-04-08 DIAGNOSIS — B369 Superficial mycosis, unspecified: Secondary | ICD-10-CM | POA: Insufficient documentation

## 2011-04-08 DIAGNOSIS — R5383 Other fatigue: Secondary | ICD-10-CM

## 2011-04-08 DIAGNOSIS — F17200 Nicotine dependence, unspecified, uncomplicated: Secondary | ICD-10-CM

## 2011-04-08 DIAGNOSIS — N760 Acute vaginitis: Secondary | ICD-10-CM

## 2011-04-08 MED ORDER — TIZANIDINE HCL 4 MG PO TABS: ORAL_TABLET | ORAL | Status: DC

## 2011-04-08 MED ORDER — BENZONATATE 100 MG PO CAPS: 100.0000 mg | ORAL_CAPSULE | Freq: Four times a day (QID) | ORAL | Status: DC | PRN

## 2011-04-08 MED ORDER — SULFAMETHOXAZOLE-TRIMETHOPRIM 800-160 MG PO TABS: 1.0000 | ORAL_TABLET | Freq: Two times a day (BID) | ORAL | Status: AC

## 2011-04-08 MED ORDER — CLOTRIMAZOLE-BETAMETHASONE 1-0.05 % EX CREA: TOPICAL_CREAM | Freq: Two times a day (BID) | CUTANEOUS | Status: DC

## 2011-04-08 NOTE — Assessment & Plan Note (Signed)
2 week h/o increased chest congestion with yellow sputum, antibiotic and decongestant prescribed

## 2011-04-08 NOTE — Assessment & Plan Note (Signed)
Counseled re the need for low carb diet , exercise and weight loss, will continue to follow HBA1C values

## 2011-04-08 NOTE — Assessment & Plan Note (Signed)
Lab to be checked today to determine control

## 2011-04-08 NOTE — Assessment & Plan Note (Signed)
Deteriorated, up to 10 per day, cessation counseling done

## 2011-04-08 NOTE — Assessment & Plan Note (Signed)
Deteriorated. Patient re-educated about  the importance of commitment to a  minimum of 150 minutes of exercise per week. The importance of healthy food choices with portion control discussed. Encouraged to start a food diary, count calories and to consider  joining a support group. Sample diet sheets offered. Goals set by the patient for the next several months.    

## 2011-04-08 NOTE — Assessment & Plan Note (Signed)
Controlled, no change in medication EKG today

## 2011-04-08 NOTE — Patient Instructions (Addendum)
F/u in 4 month  Pls cut back by one cigarette each month, you need to quit.  It is important that you exercise regularly at least 30 minutes 5 times a week. If you develop chest pain, have severe difficulty breathing, or feel very tired, stop exercising immediately and seek medical attention   A healthy diet is rich in fruit, vegetables and whole grains. Poultry fish, nuts and beans are a healthy choice for protein rather then red meat. A low sodium diet and drinking 64 ounces of water daily is generally recommended. Oils and sweet should be limited. Carbohydrates especially for those who are diabetic or overweight, should be limited to 34-45 gram per meal. It is important to eat on a regular schedule, at least 3 times daily. Snacks should be primarily fruits, vegetables or nuts.   Weight loss goal of 7 pounds in the next 4.5 month  HBA1c, tsh and chem 7 today.  Fasting lipid, cmp, hBA1c in 4 months before visit  eKG today,   Med is set in for your cough, and we will test your discharge. Do not douche  Muscle relaxant is changed because of insurance coverage

## 2011-04-08 NOTE — Progress Notes (Signed)
Addended by: Abner Greenspan on: 04/08/2011 01:14 PM   Modules accepted: Orders

## 2011-04-08 NOTE — Progress Notes (Signed)
  Subjective:    Patient ID: Rebecca Rodgers, female    DOB: 04-Jul-1955, 56 y.o.   MRN: [id number]  HPI The PT is here for annual exam  and re-evaluation of chronic medical conditions, medication management and review of any available recent lab and radiology data.  Preventive health is updated, specifically  Cancer screening and Immunization.   Questions or concerns regarding consultations or procedures which the PT has had in the interim are  addressed The PT denies any adverse reactions to current medications since the last visit.  C/o 3 week h/o pruritic rash on chest and abdomen, when she scratches this, it bleeds.No purulent drainage. C/o malodorous vaginal d/c for the past 2 weeks. C/o increased chest congestion with cough productive of yellow sputum x 2 weeks. Has recently increased nicotine use to 10 per day, plans to cut back on this, and she has also gained weight due to dietary indiscretion over the past few months. Plans to reverse this    Review of Systems See HPI Denies recent fever or chills. Denies sinus pressure, nasal congestion, ear pain or sore throat.  Denies chest pains, palpitations and leg swelling Denies abdominal pain, nausea, vomiting,diarrhea or constipation.   Denies dysuria, frequency, hesitancy or incontinence. Chronic back pain and spasm, relies on muscle relaxant, will need to change med based on formulary coverage Denies headaches, seizures, numbness, or tingling. Denies uncontrolled  depression, anxiety or insomnia.Follows with psychiatry regularly, no recent medication changes         Objective:   Physical Exam Pleasant well nourished female, alert and oriented x 3, in no cardio-pulmonary distress. Afebrile. HEENT No facial trauma or asymetry. Sinuses non tender.  EOMI, PERTL, fundoscopic exam is normal, no hemorhage or exudate.  External ears normal, tympanic membranes clear. Oropharynx moist, no exudate, fair dentition. Neck: supple, no  adenopathy,JVD or thyromegaly.No bruits.  Chest: Decreased air entry, scattered crackles, no wheezes. Non tender to palpation  Breast: No asymetry,no masses. No nipple discharge or inversion. No axillary or supraclavicular adenopathy  Cardiovascular system; Heart sounds normal,  S1 and  S2 ,no S3.  No murmur, or thrill. Apical beat not displaced Peripheral pulses normal.  Abdomen: Soft, non tender, no organomegaly or masses. No bruits. Bowel sounds normal. No guarding, tenderness or rebound.  Rectal:  No mass. Guaiac negative stool.  GU: External genitalia normal. No lesions. Vaginal canal normal. white  discharge. Uterus absent, no adnexal masses, or adnexal tenderness.  Musculoskeletal exam: Full ROM of spine, hips , shoulders and knees. No deformity ,swelling or crepitus noted. No muscle wasting or atrophy.   Neurologic: Cranial nerves 2 to 12 intact. Power, tone ,sensation and reflexes normal throughout. No disturbance in gait. No tremor.  Skin: Dermatomycosis on anterior chest and abdomen. Pigmentation normal throughout  Psych; Normal mood and affect. Judgement and concentration normal. Slight anxiety noted        Assessment & Plan:

## 2011-04-08 NOTE — Assessment & Plan Note (Signed)
Pruritic rash on anterior chest and abdomen 3 weeks, fungal infection, med prescribed

## 2011-04-09 ENCOUNTER — Other Ambulatory Visit: Payer: Self-pay | Admitting: Family Medicine

## 2011-04-09 LAB — WET PREP BY MOLECULAR PROBE
Candida species: NEGATIVE
Trichomonas vaginosis: NEGATIVE

## 2011-04-09 LAB — GC/CHLAMYDIA PROBE AMP, GENITAL: GC Probe Amp, Genital: NEGATIVE

## 2011-04-22 ENCOUNTER — Telehealth: Payer: Self-pay | Admitting: Family Medicine

## 2011-04-22 NOTE — Telephone Encounter (Signed)
Pt stated that she did not want to come for an appointment because she does not want to come out in the cold. Please advise.

## 2011-04-24 ENCOUNTER — Encounter: Payer: Self-pay | Admitting: Family Medicine

## 2011-04-24 NOTE — Telephone Encounter (Signed)
Advise her to use mucinex and tylenol, lots of fluids and rest, if worsens will need office eval

## 2011-04-25 NOTE — Telephone Encounter (Signed)
Called and left message for pt to return call.  

## 2011-04-25 NOTE — Telephone Encounter (Signed)
Spoke with pt and she stated that she is feeling somewhat better.  However she wants to let the dr. Ashley Mariner that the zanaflex is not helping her symptoms and she is in more pain after taking it.

## 2011-04-25 NOTE — Telephone Encounter (Signed)
Stop the zannaflex, anf if her prob is chronic back or neck pain, she needs to consider physical therapy.

## 2011-04-30 NOTE — Telephone Encounter (Signed)
Pt aware.

## 2011-05-04 LAB — LIPID PANEL
HDL: 55 mg/dL (ref >39)
LDL Cholesterol: 115 mg/dL — ABNORMAL HIGH (ref 0–99)
VLDL: 19 mg/dL (ref 0–40)

## 2011-05-04 LAB — COMPLETE METABOLIC PANEL WITH GFR
ALT: 17 U/L (ref 0–35)
AST: 18 U/L (ref 0–37)
CO2: 20 mEq/L (ref 19–32)
GFR, Est African American: 72 mL/min
Sodium: 139 mEq/L (ref 135–145)
Total Bilirubin: 0.3 mg/dL (ref 0.3–1.2)
Total Protein: 7.4 g/dL (ref 6.0–8.3)

## 2011-05-04 LAB — HEMOGLOBIN A1C
Hgb A1c MFr Bld: 6.3 % — ABNORMAL HIGH (ref <5.7)
Mean Plasma Glucose: 134 mg/dL — ABNORMAL HIGH (ref <117)

## 2011-05-07 ENCOUNTER — Other Ambulatory Visit: Payer: Self-pay | Admitting: Family Medicine

## 2011-05-08 ENCOUNTER — Other Ambulatory Visit: Payer: Self-pay | Admitting: Family Medicine

## 2011-05-09 ENCOUNTER — Telehealth: Payer: Self-pay

## 2011-05-09 MED ORDER — MELOXICAM 15 MG PO TABS: 15.0000 mg | ORAL_TABLET | Freq: Every day | ORAL | Status: DC

## 2011-05-09 NOTE — Telephone Encounter (Signed)
Med sent in and patient aware to check with pharmacy later

## 2011-05-09 NOTE — Telephone Encounter (Signed)
pls advise and send in meloxicam 15 mg one daily as needed #30 only

## 2011-05-09 NOTE — Telephone Encounter (Signed)
Neck and back and both wrists are hurting her. Wants to know if she can have something for pain?

## 2011-05-14 ENCOUNTER — Encounter (HOSPITAL_COMMUNITY): Payer: Self-pay | Admitting: Dietician

## 2011-05-14 ENCOUNTER — Telehealth (HOSPITAL_COMMUNITY): Payer: Self-pay | Admitting: Dietician

## 2011-05-14 NOTE — Telephone Encounter (Signed)
Please refer to documentation note for details.

## 2011-05-14 NOTE — Progress Notes (Signed)
[facility] Diabetes Class Completion  Date:May 14, 2011  Time: 10:00 AM  Pt attended [facility]'s Diabetes Class on May 14, 2011.   Patient was educated on the following topics: carbohydrate metabolism in relation to diabetes, sources of carbohydrate, carbohydrate counting, meal planning strategies, food label reading, and portion control.   Lexie Koehl A. Kayan, RD, LDN Date:May 14, 2011 Time: 10:00 AM 

## 2011-06-05 ENCOUNTER — Other Ambulatory Visit: Payer: Self-pay | Admitting: Family Medicine

## 2011-06-10 ENCOUNTER — Other Ambulatory Visit: Payer: Self-pay | Admitting: Family Medicine

## 2011-07-04 ENCOUNTER — Encounter (HOSPITAL_COMMUNITY): Payer: Self-pay | Admitting: Psychiatry

## 2011-07-04 ENCOUNTER — Ambulatory Visit (INDEPENDENT_AMBULATORY_CARE_PROVIDER_SITE_OTHER): Payer: Medicare Other | Admitting: Psychiatry

## 2011-07-04 VITALS — Wt 177.0 lb

## 2011-07-04 DIAGNOSIS — F319 Bipolar disorder, unspecified: Secondary | ICD-10-CM

## 2011-07-04 MED ORDER — LAMOTRIGINE 200 MG PO TABS: 200.0000 mg | ORAL_TABLET | Freq: Every day | ORAL | Status: DC

## 2011-07-04 MED ORDER — OLANZAPINE 5 MG PO TABS: 5.0000 mg | ORAL_TABLET | Freq: Every day | ORAL | Status: DC

## 2011-07-04 MED ORDER — TRAZODONE HCL 100 MG PO TABS: 100.0000 mg | ORAL_TABLET | Freq: Every day | ORAL | Status: DC
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
## 2011-08-01 ENCOUNTER — Other Ambulatory Visit: Payer: Self-pay | Admitting: Family Medicine

## 2011-08-20 ENCOUNTER — Encounter: Payer: Self-pay | Admitting: Family Medicine

## 2011-08-20 ENCOUNTER — Ambulatory Visit (INDEPENDENT_AMBULATORY_CARE_PROVIDER_SITE_OTHER): Payer: Medicare Other | Admitting: Family Medicine

## 2011-08-20 VITALS — BP 110/60 | HR 98 | Resp 18 | Ht 63.0 in | Wt 179.1 lb

## 2011-08-20 DIAGNOSIS — R7309 Other abnormal glucose: Secondary | ICD-10-CM

## 2011-08-20 DIAGNOSIS — E669 Obesity, unspecified: Secondary | ICD-10-CM

## 2011-08-20 DIAGNOSIS — J309 Allergic rhinitis, unspecified: Secondary | ICD-10-CM

## 2011-08-20 DIAGNOSIS — J449 Chronic obstructive pulmonary disease, unspecified: Secondary | ICD-10-CM

## 2011-08-20 DIAGNOSIS — Z13220 Encounter for screening for lipoid disorders: Secondary | ICD-10-CM

## 2011-08-20 DIAGNOSIS — I10 Essential (primary) hypertension: Secondary | ICD-10-CM

## 2011-08-20 DIAGNOSIS — F17200 Nicotine dependence, unspecified, uncomplicated: Secondary | ICD-10-CM

## 2011-08-20 DIAGNOSIS — R5383 Other fatigue: Secondary | ICD-10-CM

## 2011-08-20 DIAGNOSIS — R7303 Prediabetes: Secondary | ICD-10-CM

## 2011-08-20 DIAGNOSIS — IMO0001 Reserved for inherently not codable concepts without codable children: Secondary | ICD-10-CM

## 2011-08-20 DIAGNOSIS — E039 Hypothyroidism, unspecified: Secondary | ICD-10-CM

## 2011-08-20 DIAGNOSIS — J209 Acute bronchitis, unspecified: Secondary | ICD-10-CM

## 2011-08-20 DIAGNOSIS — B369 Superficial mycosis, unspecified: Secondary | ICD-10-CM

## 2011-08-20 DIAGNOSIS — F329 Major depressive disorder, single episode, unspecified: Secondary | ICD-10-CM

## 2011-08-20 MED ORDER — SULFAMETHOXAZOLE-TRIMETHOPRIM 800-160 MG PO TABS: 1.0000 | ORAL_TABLET | Freq: Two times a day (BID) | ORAL | Status: AC

## 2011-08-20 MED ORDER — DICLOFENAC SODIUM 1 % TD GEL: 1.0000 "application " | Freq: Two times a day (BID) | TRANSDERMAL | Status: DC

## 2011-08-20 MED ORDER — ACETAMINOPHEN 500 MG PO TABS: ORAL_TABLET | ORAL | Status: DC

## 2011-08-20 MED ORDER — CLOTRIMAZOLE-BETAMETHASONE 1-0.05 % EX CREA: TOPICAL_CREAM | Freq: Two times a day (BID) | CUTANEOUS | Status: DC

## 2011-08-20 MED ORDER — BENZONATATE 100 MG PO CAPS: 100.0000 mg | ORAL_CAPSULE | Freq: Four times a day (QID) | ORAL | Status: DC | PRN

## 2011-08-20 NOTE — Assessment & Plan Note (Signed)
Controlled, no change in medication  

## 2011-08-20 NOTE — Progress Notes (Signed)
  Subjective:    Patient ID: Rebecca Rodgers, female    DOB: [birth year], 56 y.o.   MRN: [id number]  HPI  The PT is here for follow up and re-evaluation of chronic medical conditions, medication management and review of any available recent lab and radiology data.  Preventive health is updated, specifically  Cancer screening and Immunization.   Questions or concerns regarding consultations or procedures which the PT has had in the interim are  addressed. The PT denies any adverse reactions to current medications since the last visit.  5 day h/o chest congestion and cough productive of thick sputum C/o intermittent flare of fungal skin infection esp under breasts and in skin folds, requests med refill.Denies uncontrolled depression, happy since recently got a car from her partner, and not in close touch with MOm. Smoking unchanged, no plan to quit now. Working on diet, no regular exercise yet   Review of Systems See HPI Denies recent fever or chills. Denies sinus pressure, nasal congestion, ear pain or sDenies chest congestion, productive cough or wheezing. Denies chest pains, palpitations and leg swelling Denies abdominal pain, nausea, vomiting,diarrhea or constipation.   Denies dysuria, frequency, hesitancy or incontinence. Denies uncontrolled  joint pain, swelling and limitation in mobility. Denies headaches, seizures, numbness, or tingling. Denies uncontrolled  depression, anxiety or insomnia.       Objective:   Physical Exam Patient alert and oriented and in no cardiopulmonary distress.  HEENT: No facial asymmetry, EOMI, no sinus tenderness,  oropharynx pink and moist.  Neck supple no adenopathy.  Chest: decreased air entry , scattered crackles, no wheezes  CVS: S1, S2 no murmurs, no S3.  ABD: Soft non tender. Bowel sounds normal.  Ext: No edema  MS: Adequate ROM spine, shoulders, hips and knees.  Skin: Intact, no ulcerations or rash noted.  Psych: Good eye contact,  normal affect. Memory intact not anxious or depressed appearing.  CNS: CN 2-12 intact, power, tone and sensation normal throughout.        Assessment & Plan:

## 2011-08-20 NOTE — Patient Instructions (Addendum)
F/u in 4.5 month  It is important that you exercise regularly at least 30 minutes 5 times a week. If you develop chest pain, have severe difficulty breathing, or feel very tired, stop exercising immediately and seek medical attention   A healthy diet is rich in fruit, vegetables and whole grains. Poultry fish, nuts and beans are a healthy choice for protein rather then red meat. A low sodium diet and drinking 64 ounces of water daily is generally recommended. Oils and sweet should be limited. Carbohydrates especially for those who are diabetic or overweight, should be limited to 30-45 gram per meal. It is important to eat on a regular schedule, at least 3 times daily. Snacks should be primarily fruits, vegetables or nuts.  You will be treated for cough and congestion, also for fungal skin infection.  No more medication for joint pain except tylenol and topical gel, both are sent in  You need to stop smoking   hBA1C and TSH today  Fasting lipid, cmp, hBA1C in 4.5 months bEFORE next visit

## 2011-08-21 LAB — TSH: TSH: 1.943 u[IU]/mL (ref 0.350–4.500)

## 2011-08-25 NOTE — Assessment & Plan Note (Signed)
Decongestant and antibiotic prescribed 

## 2011-08-25 NOTE — Assessment & Plan Note (Signed)
Improved pain, pt to stop anti inflammatories, will use only tylenol

## 2011-08-25 NOTE — Assessment & Plan Note (Signed)
Deteriorated, ongoing nicotine use 

## 2011-08-25 NOTE — Assessment & Plan Note (Signed)
unchanged Patient re-educated about  the importance of commitment to a  minimum of 150 minutes of exercise per week. The importance of healthy food choices with portion control discussed. Encouraged to start a food diary, count calories and to consider  joining a support group. Sample diet sheets offered. Goals set by the patient for the next several months.    

## 2011-08-25 NOTE — Assessment & Plan Note (Signed)
Im[proved, treated by psychiatry 

## 2011-08-25 NOTE — Assessment & Plan Note (Signed)
Controlled, no change in medication  

## 2011-08-25 NOTE — Assessment & Plan Note (Signed)
Improved pt applauded on this

## 2011-08-25 NOTE — Assessment & Plan Note (Signed)
Recurrent flares of infection esp in skin folds, medication prescribed, and importance of keeping skin dry, stressed

## 2011-08-25 NOTE — Assessment & Plan Note (Signed)
Unchanged , cessation counseling done 

## 2011-09-03 ENCOUNTER — Other Ambulatory Visit: Payer: Self-pay | Admitting: Family Medicine

## 2011-09-04 ENCOUNTER — Telehealth: Payer: Self-pay | Admitting: Family Medicine

## 2011-09-04 NOTE — Telephone Encounter (Signed)
Meds were refilled

## 2011-10-03 ENCOUNTER — Telehealth: Payer: Self-pay | Admitting: Family Medicine

## 2011-10-03 ENCOUNTER — Ambulatory Visit (INDEPENDENT_AMBULATORY_CARE_PROVIDER_SITE_OTHER): Payer: Medicare Other | Admitting: Psychiatry

## 2011-10-03 ENCOUNTER — Encounter (HOSPITAL_COMMUNITY): Payer: Self-pay | Admitting: Psychiatry

## 2011-10-03 ENCOUNTER — Other Ambulatory Visit: Payer: Self-pay | Admitting: Family Medicine

## 2011-10-03 VITALS — Wt 178.0 lb

## 2011-10-03 DIAGNOSIS — F319 Bipolar disorder, unspecified: Secondary | ICD-10-CM

## 2011-10-03 DIAGNOSIS — F1010 Alcohol abuse, uncomplicated: Secondary | ICD-10-CM

## 2011-10-03 MED ORDER — LAMOTRIGINE 200 MG PO TABS: 200.0000 mg | ORAL_TABLET | Freq: Every day | ORAL | Status: DC

## 2011-10-03 MED ORDER — OLANZAPINE 5 MG PO TABS: 5.0000 mg | ORAL_TABLET | Freq: Every day | ORAL | Status: DC

## 2011-10-03 MED ORDER — TRAZODONE HCL 100 MG PO TABS: 100.0000 mg | ORAL_TABLET | Freq: Every day | ORAL | Status: DC
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
## 2011-10-03 NOTE — Telephone Encounter (Signed)
agree

## 2012-01-01 ENCOUNTER — Other Ambulatory Visit: Payer: Self-pay | Admitting: Family Medicine

## 2012-01-02 ENCOUNTER — Encounter (HOSPITAL_COMMUNITY): Payer: Self-pay | Admitting: Psychiatry

## 2012-01-02 ENCOUNTER — Ambulatory Visit (INDEPENDENT_AMBULATORY_CARE_PROVIDER_SITE_OTHER): Payer: Medicare Other | Admitting: Psychiatry

## 2012-01-02 ENCOUNTER — Telehealth (HOSPITAL_COMMUNITY): Payer: Self-pay | Admitting: *Deleted

## 2012-01-02 ENCOUNTER — Other Ambulatory Visit (HOSPITAL_COMMUNITY): Payer: Self-pay

## 2012-01-02 VITALS — Wt 180.0 lb

## 2012-01-02 DIAGNOSIS — F319 Bipolar disorder, unspecified: Secondary | ICD-10-CM

## 2012-01-02 MED ORDER — LAMOTRIGINE 200 MG PO TABS: 200.0000 mg | ORAL_TABLET | Freq: Every day | ORAL | Status: DC

## 2012-01-02 MED ORDER — TRAZODONE HCL 100 MG PO TABS: 100.0000 mg | ORAL_TABLET | Freq: Every day | ORAL | Status: DC

## 2012-01-02 MED ORDER — OLANZAPINE 2.5 MG PO TABS: 2.5000 mg | ORAL_TABLET | Freq: Every day | ORAL | Status: DC
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
## 2012-01-07 ENCOUNTER — Other Ambulatory Visit: Payer: Self-pay | Admitting: Family Medicine

## 2012-01-07 DIAGNOSIS — IMO0001 Reserved for inherently not codable concepts without codable children: Secondary | ICD-10-CM

## 2012-01-08 ENCOUNTER — Encounter: Payer: Self-pay | Admitting: Family Medicine

## 2012-01-08 ENCOUNTER — Ambulatory Visit (INDEPENDENT_AMBULATORY_CARE_PROVIDER_SITE_OTHER): Payer: Medicare Other | Admitting: Family Medicine

## 2012-01-08 VITALS — BP 112/78 | HR 82 | Resp 15 | Ht 63.0 in | Wt 182.0 lb

## 2012-01-08 DIAGNOSIS — Z23 Encounter for immunization: Secondary | ICD-10-CM

## 2012-01-08 DIAGNOSIS — R7303 Prediabetes: Secondary | ICD-10-CM

## 2012-01-08 DIAGNOSIS — F329 Major depressive disorder, single episode, unspecified: Secondary | ICD-10-CM

## 2012-01-08 DIAGNOSIS — E669 Obesity, unspecified: Secondary | ICD-10-CM

## 2012-01-08 DIAGNOSIS — I10 Essential (primary) hypertension: Secondary | ICD-10-CM

## 2012-01-08 DIAGNOSIS — R7309 Other abnormal glucose: Secondary | ICD-10-CM

## 2012-01-08 DIAGNOSIS — M25569 Pain in unspecified knee: Secondary | ICD-10-CM

## 2012-01-08 DIAGNOSIS — F411 Generalized anxiety disorder: Secondary | ICD-10-CM

## 2012-01-08 DIAGNOSIS — E039 Hypothyroidism, unspecified: Secondary | ICD-10-CM

## 2012-01-08 DIAGNOSIS — J449 Chronic obstructive pulmonary disease, unspecified: Secondary | ICD-10-CM

## 2012-01-08 DIAGNOSIS — M25561 Pain in right knee: Secondary | ICD-10-CM

## 2012-01-08 DIAGNOSIS — F17200 Nicotine dependence, unspecified, uncomplicated: Secondary | ICD-10-CM

## 2012-01-08 DIAGNOSIS — L299 Pruritus, unspecified: Secondary | ICD-10-CM | POA: Insufficient documentation

## 2012-01-08 MED ORDER — HYDROXYZINE HCL 10 MG PO TABS: ORAL_TABLET | ORAL | Status: DC

## 2012-01-08 MED ORDER — CETIRIZINE HCL 10 MG PO TBDP: 1.0000 | ORAL_TABLET | Freq: Every day | ORAL | Status: DC

## 2012-01-08 NOTE — Progress Notes (Signed)
  Subjective:    Patient ID: Rebecca Rodgers, female    DOB: [birth year], 56 y.o.   MRN: [id number]  HPI The PT is here for follow up and re-evaluation of chronic medical conditions, medication management and review of any available recent lab and radiology data.  Preventive health is updated, specifically  Cancer screening and Immunization.   Questions or concerns regarding consultations or procedures which the PT has had in the interim are  addressed. The PT denies any adverse reactions to current medications since the last visit. C/o increased right knee pain and instability, denies actually falling but feels as if she might at times  Still smoking , and working on cutting back, no quit date yet No commitment to reduced caloric intake and regular physical activity , no change in weight  Mental health is good       Review of Systems See HPI Denies recent fever or chills. Denies sinus pressure, nasal congestion, ear pain or sore throat. Denies chest congestion, productive cough or wheezing. Denies chest pains, palpitations and leg swelling Denies abdominal pain, nausea, vomiting,diarrhea or constipation.   Denies dysuria, frequency, hesitancy or incontinence. Denies joint pain, swelling and limitation in mobility. Denies headaches, seizures, numbness, or tingling. Denies depression, anxiety or insomnia. Denies skin break down or rash.        Objective:   Physical Exam Patient alert and oriented and in no cardiopulmonary distress.  HEENT: No facial asymmetry, EOMI, no sinus tenderness,  oropharynx pink and moist.  Neck supple no adenopathy.  Chest: Clear to auscultation bilaterally.Decreased air entry bilaterally  CVS: S1, S2 no murmurs, no S3.  ABD: Soft non tender. Bowel sounds normal.  Ext: No edema  MS: Adequate ROM spine, shoulders, hips  Reduced in right knee with crepitus  Skin: Intact, no ulcerations or rash noted.  Psych: Good eye contact, normal affect.  Memory intact not anxious or depressed appearing.  CNS: CN 2-12 intact, power, tone and sensation normal throughout.        Assessment & Plan:

## 2012-01-08 NOTE — Patient Instructions (Addendum)
Annual wellness in February  It is important that you exercise regularly at least 30 minutes 5 times a week. If you develop chest pain, have severe difficulty breathing, or feel very tired, stop exercising immediately and seek medical attention   A healthy diet is rich in fruit, vegetables and whole grains. Poultry fish, nuts and beans are a healthy choice for protein rather then red meat. A low sodium diet and drinking 64 ounces of water daily is generally recommended. Oils and sweet should be limited. Carbohydrates especially for those who are diabetic or overweight, should be limited to 30-45 gram per meal. It is important to eat on a regular schedule, at least 3 times daily. Snacks should be primarily fruits, vegetables or nuts.  You need to stop smoking this will reduce your risk of heart disease, cancer, and stroke. Maximum  Of 4 per day when you get back  Flu vaccine today  Weight loss goal of 8 pounds

## 2012-01-09 ENCOUNTER — Other Ambulatory Visit: Payer: Self-pay | Admitting: Family Medicine

## 2012-01-10 NOTE — Assessment & Plan Note (Signed)
Unchanged. Patient re-educated about  the importance of commitment to a  minimum of 150 minutes of exercise per week. The importance of healthy food choices with portion control discussed. Encouraged to start a food diary, count calories and to consider  joining a support group. Sample diet sheets offered. Goals set by the patient for the next several months.    

## 2012-01-10 NOTE — Assessment & Plan Note (Signed)
Uncontrolled and increased with instability, refer to ortho

## 2012-01-10 NOTE — Assessment & Plan Note (Signed)
Improved and controlled , managed by psych 

## 2012-01-10 NOTE — Assessment & Plan Note (Signed)
Controlled, no change in medication DASH diet and commitment to daily physical activity for a minimum of 30 minutes discussed and encouraged, as a part of hypertension management. The importance of attaining a healthy weight is also discussed.  

## 2012-01-10 NOTE — Assessment & Plan Note (Signed)
Ongoing use  Patient counseled for approximately 5 minutes regarding the health risks of ongoing nicotine use, specifically all types of cancer, heart disease, stroke and respiratory failure. The options available for help with cessation ,the behavioral changes to assist the process, and the option to either gradully reduce usage  Or abruptly stop.is also discussed. Pt is also encouraged to set specific goals in number of cigarettes used daily, as well as to set a quit date.

## 2012-01-10 NOTE — Assessment & Plan Note (Signed)
Controlled, no change in medication Updated lab needed  

## 2012-01-10 NOTE — Assessment & Plan Note (Signed)
Patient educated about the importance of limiting  Carbohydrate intake , the need to commit to daily physical activity for a minimum of 30 minutes , and to commit weight loss. The fact that changes in all these areas will reduce or eliminate all together the development of diabetes is stressed.   Updated lab needed 

## 2012-01-10 NOTE — Assessment & Plan Note (Signed)
Progressively worsening due to ongoing nicotine use 

## 2012-01-10 NOTE — Assessment & Plan Note (Signed)
Improved and followed by psych

## 2012-01-17 ENCOUNTER — Other Ambulatory Visit: Payer: Self-pay

## 2012-01-17 MED ORDER — ROSUVASTATIN CALCIUM 40 MG PO TABS: 40.0000 mg | ORAL_TABLET | Freq: Every day | ORAL | Status: DC

## 2012-01-22 ENCOUNTER — Ambulatory Visit (INDEPENDENT_AMBULATORY_CARE_PROVIDER_SITE_OTHER): Payer: Medicare Other

## 2012-01-22 ENCOUNTER — Ambulatory Visit (INDEPENDENT_AMBULATORY_CARE_PROVIDER_SITE_OTHER): Payer: Medicare Other | Admitting: Orthopedic Surgery

## 2012-01-22 ENCOUNTER — Encounter: Payer: Self-pay | Admitting: Orthopedic Surgery

## 2012-01-22 VITALS — BP 98/62 | Ht 63.0 in | Wt 182.0 lb

## 2012-01-22 DIAGNOSIS — M179 Osteoarthritis of knee, unspecified: Secondary | ICD-10-CM | POA: Insufficient documentation

## 2012-01-22 DIAGNOSIS — M25569 Pain in unspecified knee: Secondary | ICD-10-CM

## 2012-01-22 DIAGNOSIS — M549 Dorsalgia, unspecified: Secondary | ICD-10-CM | POA: Insufficient documentation

## 2012-01-22 DIAGNOSIS — M1710 Unilateral primary osteoarthritis, unspecified knee: Secondary | ICD-10-CM

## 2012-01-22 MED ORDER — TRAMADOL-ACETAMINOPHEN 37.5-325 MG PO TABS: 1.0000 | ORAL_TABLET | ORAL | Status: DC | PRN

## 2012-01-22 MED ORDER — DICLOFENAC POTASSIUM 50 MG PO TABS: 50.0000 mg | ORAL_TABLET | Freq: Two times a day (BID) | ORAL | Status: DC

## 2012-01-22 NOTE — Progress Notes (Signed)
Patient ID: Rebecca Rodgers, female   DOB: October 21, 1955, 56 y.o.   MRN: [id number] Chief Complaint  Patient presents with  . Knee Pain    right knee pain x 1 year, gradual onset, no known injury    Referral from Dr. Syliva Overman  Chief complaint right knee pain secondary complaint lower back pain  This patient has had pain in her right knee x1 year came on gradually with no associated trauma. She complains of sharp pain in the anterior portion of her knee which she rates as 9/10. Her pain is worse with walking or climbing steps he experiences some locking sensations as well no swelling no catching. She feels like the knee may give out. Her symptoms are intermittent  Review of systems is quite lengthy with unexpected weight loss and weight gain wheezing cough snoring heartburn rash itching anxiety depression easy bleeding and bruising excessive thirst and seasonal allergy she did deny frequency urgency or difficulty with urinating. She denied blurred vision chest pain or chest palpitations  Past Medical History  Diagnosis Date  . Hypertension   . Hyperlipidemia   . Hypothyroidism   . Asthma   . Uterine cancer   . Hx of hysterectomy   . Suicide attempt 1997    when a relationship broke up    . Suicide attempt     3 months ago because of ill health of her parents     BP 98/62  Ht 5\' 3"  (1.6 m)  Wt 182 lb (82.555 kg)  BMI 32.24 kg/m2 Vital signs are stable as recorded  General appearance is normal  The patient is alert and oriented x3  The patient's mood and affect are normal  Gait assessment: Ambulation is not affected. The cardiovascular exam reveals normal pulses and temperature without edema swelling.  The lymphatic system is negative for palpable lymph nodes  The sensory exam is normal.  There are no pathologic reflexes.  Balance is normal.   Exam of the right knee Inspection tenderness over the medial femoral condyle but not over the medial joint line Range of  motion continues to be normal Stability tests were normal Strength grade strength 5 over 5 Skin normal skin on the right knee  X-rays show degenerative joint disease  Impression osteoarthritis right knee weakness in the quadriceps muscle. Lower back pain  Recommend physical therapy, She is on a proton pump inhibitor so she can take the diclofenac orally as long as it's monitored. She can also take Ultracet for pain. She's not a surgical candidate at this time her disease is not to that point  Follow up as needed

## 2012-01-22 NOTE — Patient Instructions (Signed)
Degenerative Arthritis You have osteoarthritis. This is the wear and tear arthritis that comes with aging. It is also called degenerative arthritis. This is common in people past middle age. It is caused by stress on the joints. The large weight bearing joints of the lower extremities are most often affected. The knees, hips, back, neck, and hands can become painful, swollen, and stiff. This is the most common type of arthritis. It comes on with age, carrying too much weight, or from an injury. Treatment includes resting the sore joint until the pain and swelling improve. Crutches or a walker may be needed for severe flares. Only take over-the-counter or prescription medicines for pain, discomfort, or fever as directed by your caregiver. Local heat therapy may improve motion. Cortisone shots into the joint are sometimes used to reduce pain and swelling during flares. Osteoarthritis is usually not crippling and progresses slowly. There are things you can do to decrease pain:  Avoid high impact activities.   Exercise regularly.   Low impact exercises such as walking, biking and swimming help to keep the muscles strong and keep normal joint function.   Stretching helps to keep your range of motion.   Lose weight if you are overweight. This reduces joint stress.  In severe cases when you have pain at rest or increasing disability, joint surgery may be helpful. See your caregiver for follow-up treatment as recommended.   SEEK IMMEDIATE MEDICAL CARE IF:    You have severe joint pain.   Marked swelling and redness in your joint develops.   You develop a high fever.  Document Released: 03/11/2005 Document Revised: 06/03/2011 Document Reviewed: 08/11/2006 [facility] Patient Information 2013 [place], [place].   Back Pain, Adult Low back pain is very common. About 1 in 5 people have back pain. The cause of low back pain is rarely dangerous. The pain often gets better over time. About half of people with a  sudden onset of back pain feel better in just 2 weeks. About 8 in 10 people feel better by 6 weeks.   CAUSES Some common causes of back pain include:  Strain of the muscles or ligaments supporting the spine.   Wear and tear (degeneration) of the spinal discs.   Arthritis.   Direct injury to the back.  DIAGNOSIS Most of the time, the direct cause of low back pain is not known. However, back pain can be treated effectively even when the exact cause of the pain is unknown. Answering your caregiver's questions about your overall health and symptoms is one of the most accurate ways to make sure the cause of your pain is not dangerous. If your caregiver needs more information, he or she may order lab work or imaging tests (X-rays or MRIs). However, even if imaging tests show changes in your back, this usually does not require surgery. HOME CARE INSTRUCTIONS For many people, back pain returns. Since low back pain is rarely dangerous, it is often a condition that people can learn to manage on their own.    Remain active. It is stressful on the back to sit or stand in one place. Do not sit, drive, or stand in one place for more than 30 minutes at a time. Take short walks on level surfaces as soon as pain allows. Try to increase the length of time you walk each day.   Do not stay in bed. Resting more than 1 or 2 days can delay your recovery.   Do not avoid exercise or work.  Your body is made to move. It is not dangerous to be active, even though your back may hurt. Your back will likely heal faster if you return to being active before your pain is gone.   Pay attention to your body when you  bend and lift. Many people have less discomfort when lifting if they bend their knees, keep the load close to their bodies, and avoid twisting. Often, the most comfortable positions are those that put less stress on your recovering back.   Find a comfortable position to sleep. Use a firm mattress and lie on your  side with your knees slightly bent. If you lie on your back, put a pillow under your knees.   Only take over-the-counter or prescription medicines as directed by your caregiver. Over-the-counter medicines to reduce pain and inflammation are often the most helpful. Your caregiver may prescribe muscle relaxant drugs. These medicines help dull your pain so you can more quickly return to your normal activities and healthy exercise.   Put ice on the injured area.   Put ice in a plastic bag.   Place a towel between your skin and the bag.   Leave the ice on for 15 to 20 minutes, 3 to 4 times a day for the first 2 to 3 days. After that, ice and heat may be alternated to reduce pain and spasms.   Ask your caregiver about trying back exercises and gentle massage. This may be of some benefit.   Avoid feeling anxious or stressed. Stress increases muscle tension and can worsen back pain. It is important to recognize when you are anxious or stressed and learn ways to manage it. Exercise is a great option.  SEEK MEDICAL CARE IF:  You have pain that is not relieved with rest or medicine.   You have pain that does not improve in 1 week.   You have new symptoms.   You are generally not feeling well.  SEEK IMMEDIATE MEDICAL CARE IF:    You have pain that radiates from your back into your legs.   You develop new bowel or bladder control problems.   You have unusual weakness or numbness in your arms or legs.   You develop nausea or vomiting.   You develop abdominal pain.   You feel faint.  Document Released: 03/11/2005 Document Revised: 09/10/2011 Document Reviewed: 07/30/2010 [facility] Patient Information 2013 [place], [place].

## 2012-01-28 ENCOUNTER — Ambulatory Visit (HOSPITAL_COMMUNITY)
Admission: RE | Admit: 2012-01-28 | Discharge: 2012-01-28 | Disposition: A | Discharge status: Home / Self Care | Payer: Medicare Other | Source: Ambulatory Visit | Attending: Family Medicine | Admitting: Family Medicine

## 2012-01-28 ENCOUNTER — Other Ambulatory Visit: Payer: Self-pay | Admitting: Family Medicine

## 2012-01-28 DIAGNOSIS — IMO0001 Reserved for inherently not codable concepts without codable children: Secondary | ICD-10-CM

## 2012-01-28 DIAGNOSIS — Z1231 Encounter for screening mammogram for malignant neoplasm of breast: Secondary | ICD-10-CM | POA: Insufficient documentation

## 2012-02-06 ENCOUNTER — Telehealth: Payer: Self-pay | Admitting: Family Medicine

## 2012-02-06 ENCOUNTER — Other Ambulatory Visit: Payer: Self-pay | Admitting: Family Medicine

## 2012-02-06 NOTE — Telephone Encounter (Signed)
Looking at her appointment

## 2012-02-07 LAB — COMPREHENSIVE METABOLIC PANEL
CO2: 28 mEq/L (ref 19–32)
Calcium: 10.2 mg/dL (ref 8.4–10.5)
Chloride: 103 mEq/L (ref 96–112)
Creat: 1.32 mg/dL — ABNORMAL HIGH (ref 0.50–1.10)
Glucose, Bld: 97 mg/dL (ref 70–99)
Sodium: 138 mEq/L (ref 135–145)
Total Bilirubin: 0.3 mg/dL (ref 0.3–1.2)
Total Protein: 7.7 g/dL (ref 6.0–8.3)

## 2012-02-07 LAB — LIPID PANEL
Cholesterol: 223 mg/dL — ABNORMAL HIGH (ref 0–200)
Triglycerides: 156 mg/dL — ABNORMAL HIGH (ref <150)
VLDL: 31 mg/dL (ref 0–40)

## 2012-02-15 ENCOUNTER — Other Ambulatory Visit: Payer: Self-pay | Admitting: Family Medicine

## 2012-03-04 ENCOUNTER — Other Ambulatory Visit (HOSPITAL_COMMUNITY): Payer: Self-pay | Admitting: Psychiatry

## 2012-03-04 ENCOUNTER — Encounter (HOSPITAL_COMMUNITY): Payer: Self-pay | Admitting: Psychiatry

## 2012-03-04 ENCOUNTER — Ambulatory Visit (INDEPENDENT_AMBULATORY_CARE_PROVIDER_SITE_OTHER): Payer: Medicare Other | Admitting: Psychiatry

## 2012-03-04 VITALS — BP 140/78 | HR 76 | Ht 63.0 in | Wt 185.2 lb

## 2012-03-04 DIAGNOSIS — M542 Cervicalgia: Secondary | ICD-10-CM

## 2012-03-04 DIAGNOSIS — F411 Generalized anxiety disorder: Secondary | ICD-10-CM

## 2012-03-04 DIAGNOSIS — F329 Major depressive disorder, single episode, unspecified: Secondary | ICD-10-CM

## 2012-03-04 DIAGNOSIS — F17200 Nicotine dependence, unspecified, uncomplicated: Secondary | ICD-10-CM

## 2012-03-04 DIAGNOSIS — G4700 Insomnia, unspecified: Secondary | ICD-10-CM

## 2012-03-04 DIAGNOSIS — F319 Bipolar disorder, unspecified: Secondary | ICD-10-CM

## 2012-03-04 MED ORDER — TRAZODONE HCL 100 MG PO TABS: 100.0000 mg | ORAL_TABLET | Freq: Every day | ORAL | Status: DC

## 2012-03-04 MED ORDER — NICOTINE 7 MG/24HR TD PT24: 1.0000 | MEDICATED_PATCH | TRANSDERMAL | Status: DC

## 2012-03-04 MED ORDER — LAMOTRIGINE 200 MG PO TABS: 200.0000 mg | ORAL_TABLET | Freq: Every day | ORAL | Status: DC

## 2012-03-04 MED ORDER — OLANZAPINE 2.5 MG PO TABS: 2.5000 mg | ORAL_TABLET | Freq: Every day | ORAL | Status: DC
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
## 2012-03-04 NOTE — Patient Instructions (Addendum)
To prevent dementia Play cards or broad games Learn a musical instrument Dancing Use the other hand to brush teeth or open doors Get regular exercise and read or study some new subject  For smoking cessation, should continue 7 mg daily for 4 weeks, then stop.  Call the Smoking Cessation hotline at  (270) 746-5574  for support.  REMEMBER these cost the about the same as a pack of cigarettes a day, but when you stop, you have $5 a day more to spend on yourself and your health.  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  You are worth it.  Treat yourself like that.  Strongly consider attending at least 6 [employer] Meetings to help you learn about how your helping others to the exclusion of helping yourself is actually hurting yourself and is actually an addiction to fixing others and that you need to work the 12 Step to Happiness through the [employer]. Al-Anon Family Groups could be helpful with how to deal with substance abusing family and friends. Or your own issues of being in victim role.  There are only 40 Alanon Family Group meetings a week here in [place].  Online are current listing of those meetings @ [email or web address]  There are [employer].  Search on line and there you can learn the format and can access the schedule for yourself.  Their number is [phone]  The one in [name] is at the [facility] on [address]  The meeting is at 7 PM on Tuesdays.   For you brain the best diet would be to: Cut back on sugar and carbohydrates, that means very limited fruits and starchy vegetables and very limited grains, breads  (The goal is low GLYCEMIC INDEX.)  Cut back on all wheat, rye, or barley  Eat avocados, eggs, lean meat like grass fed beef and chicken  USED the Voltaren gel THREE or FOUR times a day for the body that God gave you and holds you responsible for.  Find a way to go swimming three times a week OR Dancing  three times a week.   Use YouTube to learn how to play the guitar or some other musical instrument.

## 2012-03-11 ENCOUNTER — Encounter (INDEPENDENT_AMBULATORY_CARE_PROVIDER_SITE_OTHER): Payer: Self-pay

## 2012-03-30 ENCOUNTER — Other Ambulatory Visit: Payer: Self-pay | Admitting: Family Medicine

## 2012-04-29 ENCOUNTER — Other Ambulatory Visit (HOSPITAL_COMMUNITY): Payer: Self-pay | Admitting: Psychiatry

## 2012-04-30 ENCOUNTER — Other Ambulatory Visit: Payer: Self-pay | Admitting: Family Medicine

## 2012-05-05 ENCOUNTER — Ambulatory Visit (INDEPENDENT_AMBULATORY_CARE_PROVIDER_SITE_OTHER): Payer: Medicare Other | Admitting: Psychiatry

## 2012-05-05 ENCOUNTER — Encounter (HOSPITAL_COMMUNITY): Payer: Self-pay | Admitting: Psychiatry

## 2012-05-05 DIAGNOSIS — F1010 Alcohol abuse, uncomplicated: Secondary | ICD-10-CM

## 2012-05-05 DIAGNOSIS — F17200 Nicotine dependence, unspecified, uncomplicated: Secondary | ICD-10-CM

## 2012-05-05 DIAGNOSIS — F319 Bipolar disorder, unspecified: Secondary | ICD-10-CM

## 2012-05-05 DIAGNOSIS — F329 Major depressive disorder, single episode, unspecified: Secondary | ICD-10-CM

## 2012-05-05 MED ORDER — OLANZAPINE 2.5 MG PO TABS: 2.5000 mg | ORAL_TABLET | Freq: Every day | ORAL | Status: DC

## 2012-05-05 MED ORDER — TRAZODONE HCL 100 MG PO TABS: 100.0000 mg | ORAL_TABLET | Freq: Every day | ORAL | Status: DC

## 2012-05-05 MED ORDER — LAMOTRIGINE 200 MG PO TABS: 200.0000 mg | ORAL_TABLET | Freq: Every day | ORAL | Status: DC
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
## 2012-05-05 NOTE — Patient Instructions (Signed)
Keep up the good work  Call if problems or concerns.  

## 2012-05-11 ENCOUNTER — Other Ambulatory Visit (HOSPITAL_COMMUNITY): Payer: Self-pay | Admitting: Psychiatry

## 2012-05-11 DIAGNOSIS — F329 Major depressive disorder, single episode, unspecified: Secondary | ICD-10-CM

## 2012-05-11 NOTE — Telephone Encounter (Signed)
Refill request approved via eScripts.  

## 2012-05-12 ENCOUNTER — Ambulatory Visit (INDEPENDENT_AMBULATORY_CARE_PROVIDER_SITE_OTHER): Payer: Medicare Other | Admitting: Family Medicine

## 2012-05-12 ENCOUNTER — Encounter: Payer: Self-pay | Admitting: Family Medicine

## 2012-05-12 VITALS — BP 132/72 | HR 98 | Resp 18 | Ht 63.0 in | Wt 189.1 lb

## 2012-05-12 DIAGNOSIS — R7303 Prediabetes: Secondary | ICD-10-CM

## 2012-05-12 DIAGNOSIS — Z13220 Encounter for screening for lipoid disorders: Secondary | ICD-10-CM

## 2012-05-12 DIAGNOSIS — R7309 Other abnormal glucose: Secondary | ICD-10-CM

## 2012-05-12 DIAGNOSIS — Z0000 Encounter for general adult medical examination without abnormal findings: Secondary | ICD-10-CM

## 2012-05-12 DIAGNOSIS — E039 Hypothyroidism, unspecified: Secondary | ICD-10-CM

## 2012-05-12 DIAGNOSIS — I10 Essential (primary) hypertension: Secondary | ICD-10-CM

## 2012-05-12 NOTE — Patient Instructions (Addendum)
F/u in 3 month  Please walk "Rebecca Rodgers"  for three 10 to 15 minute sessions daily.  Walking is great for depression for ALL  Fasting lipid, cmp, HBA1ac, TSH

## 2012-05-12 NOTE — Assessment & Plan Note (Signed)
Annual wellness as documented . Pt needs to start regular exercise , and follow a low carb, low fat diet   Importancce of smoking cessation , weight loss stressesd

## 2012-05-12 NOTE — Progress Notes (Signed)
Subjective:    Patient ID: Rebecca Rodgers, female    DOB: 05-11-1955, 57 y.o.   MRN: [id number]  HPI Preventive Screening-Counseling & Management   Patient present here today for a Medicare annual wellness visit.   Current Problems (verified)   Medications Prior to Visit Allergies (verified)   PAST HISTORY  Family History 5 siblings alive , none with heart disease , stroke or cancer or mental health Mother has dementia, father desceased  Social History Lives with her patner of 11 years, she is gay, smokes, occasional alcohol, past h/o illicit drugs Disabled x 15 years for mental health  Risk Factors  Current exercise habits:none regularly, needs to  Start 30 min for 30 minutes  7 days per week    Dietary issues discussed:low carb and low fat diet diet   Cardiac risk factors: nicotine, IGT, hyperlipidemia  Depression Screen  (Note: if answer to either of the following is "Yes", a more complete depression screening is indicated)   Over the past two weeks, have you felt down, depressed or hopeless? No  Over the past two weeks, have you felt little interest or pleasure in doing things? No  Have you lost interest or pleasure in daily life? No  Do you often feel hopeless? No  Do you cry easily over simple problems? No   Activities of Daily Living  In your present state of health, do you have any difficulty performing the following activities?  Driving?: No Managing money?: No Feeding yourself?:No Getting from bed to chair?:No Climbing a flight of stairs?:No Preparing food and eating?:No Bathing or showering?:No Getting dressed?:No Getting to the toilet?:No Using the toilet?:No Moving around from place to place?: No  Fall Risk Assessment In the past year have you fallen or had a near fall?:No Are you currently taking any medications that make you dizziness?:No   Hearing Difficulties: No Do you often ask people to speak up or repeat themselves?:yes, has hearing  aid, states hears better out of right ear, states uncomfortable does not use Do you experience ringing or noises in your ears?:No Do you have difficulty understanding soft or whispered voices?:yes  Cognitive Testing  Alert? Yes Normal Appearance?Yes  Oriented to person? Yes Place? Yes  Time? Yes  Displays appropriate judgment?Yes  Can read the correct time from a watch face? yes Are you having problems remembering things?No  Advanced Directives have been discussed with the patient?Yes , full code   List the Names of Other Physician/Practitioners you currently use: Dr Dan Humphreys, psychiatrist   Indicate any recent Medical Services you may have received from other than [facility] providers in the past year (date may be approximate).   Assessment:    Annual Wellness Exam   Plan:    During the course of the visit the patient was educated and counseled about appropriate screening and preventive services including:  A healthy diet is rich in fruit, vegetables and whole grains. Poultry fish, nuts and beans are a healthy choice for protein rather then red meat. A low sodium diet and drinking 64 ounces of water daily is generally recommended. Oils and sweet should be limited. Carbohydrates especially for those who are diabetic or overweight, should be limited to 30-45 gram per meal. It is important to eat on a regular schedule, at least 3 times daily. Snacks should be primarily fruits, vegetables or nuts. It is important that you exercise regularly at least 30 minutes 5 times a week. If you develop chest pain, have severe difficulty breathing,  or feel very tired, stop exercising immediately and seek medical attention  Immunization reviewed and updated. Cancer screening reviewed and updated    Patient Instructions (the written plan) was given to the patient.  Medicare Attestation  I have personally reviewed:  The patient's medical and social history  Their use of alcohol, tobacco or illicit drugs   Their current medications and supplements  The patient's functional ability including ADLs,fall risks, home safety risks, cognitive, and hearing and visual impairment  Diet and physical activities  Evidence for depression or mood disorders  The patient's weight, height, BMI, and visual acuity have been recorded in the chart. I have made referrals, counseling, and provided education to the patient based on review of the above and I have provided the patient with a written personalized care plan for preventive services.      Review of Systems     Objective:   Physical Exam        Assessment & Plan:

## 2012-05-29 ENCOUNTER — Other Ambulatory Visit: Payer: Self-pay | Admitting: Family Medicine

## 2012-07-02 ENCOUNTER — Other Ambulatory Visit: Payer: Self-pay | Admitting: Family Medicine

## 2012-07-02 ENCOUNTER — Other Ambulatory Visit (HOSPITAL_COMMUNITY): Payer: Self-pay | Admitting: Psychiatry

## 2012-07-03 ENCOUNTER — Ambulatory Visit (INDEPENDENT_AMBULATORY_CARE_PROVIDER_SITE_OTHER): Payer: Medicare Other | Admitting: Psychiatry

## 2012-07-03 ENCOUNTER — Other Ambulatory Visit: Payer: Self-pay

## 2012-07-03 ENCOUNTER — Telehealth: Payer: Self-pay | Admitting: Family Medicine

## 2012-07-03 ENCOUNTER — Encounter (HOSPITAL_COMMUNITY): Payer: Self-pay | Admitting: Psychiatry

## 2012-07-03 VITALS — Wt 190.2 lb

## 2012-07-03 DIAGNOSIS — F319 Bipolar disorder, unspecified: Secondary | ICD-10-CM

## 2012-07-03 DIAGNOSIS — M1710 Unilateral primary osteoarthritis, unspecified knee: Secondary | ICD-10-CM

## 2012-07-03 DIAGNOSIS — F17200 Nicotine dependence, unspecified, uncomplicated: Secondary | ICD-10-CM

## 2012-07-03 DIAGNOSIS — F329 Major depressive disorder, single episode, unspecified: Secondary | ICD-10-CM

## 2012-07-03 DIAGNOSIS — F411 Generalized anxiety disorder: Secondary | ICD-10-CM

## 2012-07-03 DIAGNOSIS — IMO0002 Reserved for concepts with insufficient information to code with codable children: Secondary | ICD-10-CM

## 2012-07-03 DIAGNOSIS — F3289 Other specified depressive episodes: Secondary | ICD-10-CM

## 2012-07-03 MED ORDER — TRAZODONE HCL 100 MG PO TABS: 100.0000 mg | ORAL_TABLET | Freq: Every day | ORAL | Status: DC

## 2012-07-03 MED ORDER — TRIAMTERENE-HCTZ 37.5-25 MG PO TABS: ORAL_TABLET | ORAL | Status: DC

## 2012-07-03 MED ORDER — LIDOCAINE 5 % EX PTCH: 1.0000 | MEDICATED_PATCH | Freq: Two times a day (BID) | CUTANEOUS | Status: DC

## 2012-07-03 MED ORDER — DICLOFENAC POTASSIUM 50 MG PO TABS: 50.0000 mg | ORAL_TABLET | Freq: Two times a day (BID) | ORAL | Status: DC

## 2012-07-03 MED ORDER — OLANZAPINE 2.5 MG PO TABS: 2.5000 mg | ORAL_TABLET | Freq: Every day | ORAL | Status: DC

## 2012-07-03 MED ORDER — FLUTICASONE PROPIONATE HFA 220 MCG/ACT IN AERO: INHALATION_SPRAY | RESPIRATORY_TRACT | Status: DC

## 2012-07-03 MED ORDER — LAMOTRIGINE 200 MG PO TABS: 200.0000 mg | ORAL_TABLET | Freq: Every day | ORAL | Status: DC

## 2012-07-03 MED ORDER — LEVOTHYROXINE SODIUM 50 MCG PO TABS: 50.0000 ug | ORAL_TABLET | Freq: Every day | ORAL | Status: DC

## 2012-07-03 MED ORDER — BACLOFEN 10 MG PO TABS: 10.0000 mg | ORAL_TABLET | Freq: Three times a day (TID) | ORAL | Status: DC

## 2012-07-03 MED ORDER — AMLODIPINE BESYLATE 10 MG PO TABS: ORAL_TABLET | ORAL | Status: DC

## 2012-07-03 NOTE — Telephone Encounter (Signed)
meds refilled 
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
## 2012-07-03 NOTE — Patient Instructions (Signed)
Yoga is a very helpful exercise method.  On TV, on line, or by DVD Adelfa Koh is a source of high quality information about yoga and videos on yoga.  Renee Ramus is the world's number one video yoga instructor according to some experts.  There are exceptional health benefits that can be achieved through yoga.  The main principles of yoga is acceptance, no competition, no comparison, and no judgement.  It is exceptional in helping people meditate and get to a very relaxed state.   Take care of yourself.  No one else is standing up to do the job and only you know what you need.   GET SERIOUS about taking care of yourself.  Do the next right thing and that often means doing something to care for yourself along the lines of are you hungry, are you angry, are you lonely, are you tired, are you scared?  [employer] is what that stands for.  Call if problems or concerns.

## 2012-07-04 ENCOUNTER — Other Ambulatory Visit (HOSPITAL_COMMUNITY): Payer: Self-pay | Admitting: Psychiatry

## 2012-07-06 NOTE — Telephone Encounter (Signed)
Refill request approved via eScripts.  

## 2012-08-01 ENCOUNTER — Other Ambulatory Visit: Payer: Self-pay | Admitting: Family Medicine

## 2012-08-01 ENCOUNTER — Other Ambulatory Visit (HOSPITAL_COMMUNITY): Payer: Self-pay | Admitting: Psychiatry

## 2012-08-07 ENCOUNTER — Other Ambulatory Visit: Payer: Self-pay

## 2012-08-07 LAB — LIPID PANEL
HDL: 55 mg/dL (ref >39)
LDL Cholesterol: 101 mg/dL — ABNORMAL HIGH (ref 0–99)
Total CHOL/HDL Ratio: 3.5 Ratio

## 2012-08-07 LAB — COMPREHENSIVE METABOLIC PANEL
ALT: 15 U/L (ref 0–35)
Albumin: 4.2 g/dL (ref 3.5–5.2)
Alkaline Phosphatase: 73 U/L (ref 39–117)
Potassium: 3.9 mEq/L (ref 3.5–5.3)
Sodium: 140 mEq/L (ref 135–145)
Total Bilirubin: 0.3 mg/dL (ref 0.3–1.2)
Total Protein: 7.1 g/dL (ref 6.0–8.3)

## 2012-08-07 LAB — TSH: TSH: 3.144 u[IU]/mL (ref 0.350–4.500)

## 2012-08-07 MED ORDER — TRIAMTERENE-HCTZ 37.5-25 MG PO TABS: ORAL_TABLET | ORAL | Status: DC

## 2012-08-12 ENCOUNTER — Encounter: Payer: Self-pay | Admitting: Family Medicine

## 2012-08-12 ENCOUNTER — Ambulatory Visit (INDEPENDENT_AMBULATORY_CARE_PROVIDER_SITE_OTHER): Payer: Medicare Other | Admitting: Family Medicine

## 2012-08-12 ENCOUNTER — Telehealth: Payer: Self-pay | Admitting: Family Medicine

## 2012-08-12 VITALS — BP 124/80 | HR 95 | Resp 18 | Ht 63.0 in | Wt 187.0 lb

## 2012-08-12 DIAGNOSIS — E8881 Metabolic syndrome: Secondary | ICD-10-CM

## 2012-08-12 DIAGNOSIS — E669 Obesity, unspecified: Secondary | ICD-10-CM

## 2012-08-12 DIAGNOSIS — J309 Allergic rhinitis, unspecified: Secondary | ICD-10-CM

## 2012-08-12 DIAGNOSIS — R7309 Other abnormal glucose: Secondary | ICD-10-CM

## 2012-08-12 DIAGNOSIS — E785 Hyperlipidemia, unspecified: Secondary | ICD-10-CM

## 2012-08-12 DIAGNOSIS — F17200 Nicotine dependence, unspecified, uncomplicated: Secondary | ICD-10-CM

## 2012-08-12 DIAGNOSIS — Z79899 Other long term (current) drug therapy: Secondary | ICD-10-CM

## 2012-08-12 DIAGNOSIS — F329 Major depressive disorder, single episode, unspecified: Secondary | ICD-10-CM

## 2012-08-12 DIAGNOSIS — E039 Hypothyroidism, unspecified: Secondary | ICD-10-CM

## 2012-08-12 DIAGNOSIS — I10 Essential (primary) hypertension: Secondary | ICD-10-CM

## 2012-08-12 DIAGNOSIS — R7301 Impaired fasting glucose: Secondary | ICD-10-CM

## 2012-08-12 DIAGNOSIS — R7303 Prediabetes: Secondary | ICD-10-CM

## 2012-08-12 NOTE — Progress Notes (Signed)
  Subjective:    Patient ID: Rebecca Rodgers, female    DOB: May 24, 1955, 57 y.o.   MRN: [id number]  HPI The PT is here for follow up and re-evaluation of chronic medical conditions, medication management and review of any available recent lab and radiology data.  Preventive health is updated, specifically  Cancer screening and Immunization.   Questions or concerns regarding consultations or procedures which the PT has had in the interim are  Addressed.Has seen orthopedics, and regularly sees psychiatry The PT denies any adverse reactions to current medications since the last visit.  There are no new concerns.  There are no specific complaints       Review of Systems See HPI Denies recent fever or chills. Denies sinus pressure, nasal congestion, ear pain or sore throat. Chronic  chest congestion, productive cough and  wheezing. Denies chest pains, palpitations and leg swelling Denies abdominal pain, nausea, vomiting,diarrhea or constipation.   Denies dysuria, frequency, hesitancy or incontinence. Denies joint pain, swelling and limitation in mobility. Denies headaches, seizures, numbness, or tingling. Denies uncontrolled  depression, anxiety or insomnia. Denies skin break down or rash.        Objective:   Physical Exam  Patient alert and oriented and in no cardiopulmonary distress.  HEENT: No facial asymmetry, EOMI, no sinus tenderness,  oropharynx pink and moist.  Neck supple no adenopathy.  Chest: decreased though adequate air entry, scattered crackles, few wheezes  CVS: S1, S2 no murmurs, no S3.  ABD: Soft non tender. Bowel sounds normal.  Ext: No edema  MS: Adequate ROM spine, shoulders, hips and knees.  Skin: Intact, no ulcerations or rash noted.  Psych: Good eye contact, normal affect. Memory intact not anxious or depressed appearing.  CNS: CN 2-12 intact, power, tone and sensation normal throughout.       Assessment & Plan:

## 2012-08-12 NOTE — Patient Instructions (Addendum)
F/U in 4.5 month   Fasting lipid, cmp and HBa1C in 4.5 month  You need to set a quit date and stop smoking  Please check into classes at health dept  For smoking cessation classes    Reduce the amount the food you are eating  , and commit to 45 minutes walking every day

## 2012-08-13 ENCOUNTER — Other Ambulatory Visit: Payer: Self-pay

## 2012-08-13 MED ORDER — ROSUVASTATIN CALCIUM 40 MG PO TABS: ORAL_TABLET | ORAL | Status: DC

## 2012-08-13 MED ORDER — AMLODIPINE BESYLATE 10 MG PO TABS: ORAL_TABLET | ORAL | Status: DC

## 2012-08-13 NOTE — Telephone Encounter (Signed)
Concern addressed by nurse.

## 2012-08-30 DIAGNOSIS — E8881 Metabolic syndrome: Secondary | ICD-10-CM | POA: Insufficient documentation

## 2012-08-30 DIAGNOSIS — E785 Hyperlipidemia, unspecified: Secondary | ICD-10-CM | POA: Insufficient documentation

## 2012-08-30 NOTE — Assessment & Plan Note (Signed)
Controlled, no change in medication Follows with psych regulalrly

## 2012-08-30 NOTE — Assessment & Plan Note (Signed)
Controlled, no change in medication DASH diet and commitment to daily physical activity for a minimum of 30 minutes discussed and encouraged, as a part of hypertension management. The importance of attaining a healthy weight is also discussed.  

## 2012-08-30 NOTE — Assessment & Plan Note (Signed)
Hyperlipidemia:Low fat diet discussed and encouraged.  Triglycerides elevated , needs to reduce fat in diet, no med change

## 2012-08-30 NOTE — Assessment & Plan Note (Signed)
Pt aware of Cv risk associated with this diagnosis and the need to work on lifestyle change to address this

## 2012-08-30 NOTE — Assessment & Plan Note (Signed)
Controlled, no change in medication  

## 2012-08-30 NOTE — Assessment & Plan Note (Signed)
Cutting back but unwilling to set quit date. Patient counseled for approximately 5 minutes regarding the health risks of ongoing nicotine use, specifically all types of cancer, heart disease, stroke and respiratory failure. The options available for help with cessation ,the behavioral changes to assist the process, and the option to either gradully reduce usage  Or abruptly stop.is also discussed. Pt is also encouraged to set specific goals in number of cigarettes used daily, as well as to set a quit date.

## 2012-08-30 NOTE — Assessment & Plan Note (Signed)
Deteriorated Patient educated about the importance of limiting  Carbohydrate intake , the need to commit to daily physical activity for a minimum of 30 minutes , and to commit weight loss. The fact that changes in all these areas will reduce or eliminate all together the development of diabetes is stressed.    

## 2012-08-30 NOTE — Assessment & Plan Note (Signed)
Deteriorated. Patient re-educated about  the importance of commitment to a  minimum of 150 minutes of exercise per week. The importance of healthy food choices with portion control discussed. Encouraged to start a food diary, count calories and to consider  joining a support group. Sample diet sheets offered. Goals set by the patient for the next several months.    

## 2012-09-07 ENCOUNTER — Telehealth: Payer: Self-pay | Admitting: Family Medicine

## 2012-09-07 NOTE — Telephone Encounter (Signed)
Great , keep it up!

## 2012-09-28 ENCOUNTER — Ambulatory Visit (HOSPITAL_COMMUNITY): Payer: Self-pay | Admitting: Psychiatry

## 2012-09-30 ENCOUNTER — Other Ambulatory Visit: Payer: Self-pay | Admitting: Family Medicine

## 2012-10-01 ENCOUNTER — Ambulatory Visit (INDEPENDENT_AMBULATORY_CARE_PROVIDER_SITE_OTHER): Payer: Medicare Other | Admitting: Psychiatry

## 2012-10-01 ENCOUNTER — Encounter (HOSPITAL_COMMUNITY): Payer: Self-pay | Admitting: Psychiatry

## 2012-10-01 VITALS — BP 133/95 | HR 83 | Wt 193.0 lb

## 2012-10-01 DIAGNOSIS — F319 Bipolar disorder, unspecified: Secondary | ICD-10-CM

## 2012-10-01 DIAGNOSIS — F329 Major depressive disorder, single episode, unspecified: Secondary | ICD-10-CM

## 2012-10-01 DIAGNOSIS — F1011 Alcohol abuse, in remission: Secondary | ICD-10-CM

## 2012-10-01 MED ORDER — TRAZODONE HCL 100 MG PO TABS: 100.0000 mg | ORAL_TABLET | Freq: Every day | ORAL | Status: DC

## 2012-10-01 MED ORDER — OLANZAPINE 5 MG PO TABS: 5.0000 mg | ORAL_TABLET | Freq: Every day | ORAL | Status: DC

## 2012-10-01 MED ORDER — LAMOTRIGINE 200 MG PO TABS: 200.0000 mg | ORAL_TABLET | Freq: Every day | ORAL | Status: DC
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
## 2012-10-26 ENCOUNTER — Other Ambulatory Visit: Payer: Self-pay | Admitting: Family Medicine

## 2012-11-25 ENCOUNTER — Other Ambulatory Visit: Payer: Self-pay | Admitting: Family Medicine

## 2012-11-25 ENCOUNTER — Other Ambulatory Visit: Payer: Self-pay | Admitting: *Deleted

## 2012-11-25 DIAGNOSIS — M1710 Unilateral primary osteoarthritis, unspecified knee: Secondary | ICD-10-CM

## 2012-11-25 MED ORDER — TRAMADOL-ACETAMINOPHEN 37.5-325 MG PO TABS: 1.0000 | ORAL_TABLET | ORAL | Status: DC | PRN

## 2012-12-16 ENCOUNTER — Ambulatory Visit: Payer: Medicare Other | Admitting: Family Medicine

## 2012-12-31 ENCOUNTER — Encounter (HOSPITAL_COMMUNITY): Payer: Self-pay | Admitting: Psychiatry

## 2012-12-31 ENCOUNTER — Other Ambulatory Visit: Payer: Self-pay | Admitting: Family Medicine

## 2012-12-31 ENCOUNTER — Ambulatory Visit (HOSPITAL_COMMUNITY): Payer: Self-pay | Admitting: Psychiatry

## 2012-12-31 ENCOUNTER — Ambulatory Visit (INDEPENDENT_AMBULATORY_CARE_PROVIDER_SITE_OTHER): Payer: Medicare Other | Admitting: Psychiatry

## 2012-12-31 VITALS — BP 130/78 | Ht 63.0 in | Wt 197.0 lb

## 2012-12-31 DIAGNOSIS — F329 Major depressive disorder, single episode, unspecified: Secondary | ICD-10-CM

## 2012-12-31 DIAGNOSIS — F319 Bipolar disorder, unspecified: Secondary | ICD-10-CM

## 2012-12-31 DIAGNOSIS — F1011 Alcohol abuse, in remission: Secondary | ICD-10-CM

## 2012-12-31 MED ORDER — OLANZAPINE 5 MG PO TABS: 5.0000 mg | ORAL_TABLET | Freq: Every day | ORAL | Status: DC

## 2012-12-31 MED ORDER — TRAZODONE HCL 100 MG PO TABS: 100.0000 mg | ORAL_TABLET | Freq: Every day | ORAL | Status: DC

## 2012-12-31 MED ORDER — LAMOTRIGINE 200 MG PO TABS: 200.0000 mg | ORAL_TABLET | Freq: Every day | ORAL | Status: DC
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
## 2013-01-15 ENCOUNTER — Encounter: Payer: Self-pay | Admitting: Family Medicine

## 2013-01-15 ENCOUNTER — Ambulatory Visit (INDEPENDENT_AMBULATORY_CARE_PROVIDER_SITE_OTHER): Payer: Medicare Other | Admitting: Family Medicine

## 2013-01-15 VITALS — BP 130/78 | HR 98 | Temp 98.6°F | Resp 16 | Ht 63.0 in | Wt 201.0 lb

## 2013-01-15 DIAGNOSIS — F411 Generalized anxiety disorder: Secondary | ICD-10-CM

## 2013-01-15 DIAGNOSIS — Z23 Encounter for immunization: Secondary | ICD-10-CM

## 2013-01-15 DIAGNOSIS — E785 Hyperlipidemia, unspecified: Secondary | ICD-10-CM

## 2013-01-15 DIAGNOSIS — R7309 Other abnormal glucose: Secondary | ICD-10-CM

## 2013-01-15 DIAGNOSIS — E669 Obesity, unspecified: Secondary | ICD-10-CM

## 2013-01-15 DIAGNOSIS — R7303 Prediabetes: Secondary | ICD-10-CM

## 2013-01-15 DIAGNOSIS — IMO0002 Reserved for concepts with insufficient information to code with codable children: Secondary | ICD-10-CM

## 2013-01-15 DIAGNOSIS — I10 Essential (primary) hypertension: Secondary | ICD-10-CM

## 2013-01-15 DIAGNOSIS — E039 Hypothyroidism, unspecified: Secondary | ICD-10-CM

## 2013-01-15 DIAGNOSIS — J45909 Unspecified asthma, uncomplicated: Secondary | ICD-10-CM

## 2013-01-15 MED ORDER — TRAMADOL-ACETAMINOPHEN 37.5-325 MG PO TABS: ORAL_TABLET | ORAL | Status: DC

## 2013-01-15 NOTE — Assessment & Plan Note (Signed)
Updated lab needed.  

## 2013-01-15 NOTE — Assessment & Plan Note (Signed)
Controlled, no change in medication  

## 2013-01-15 NOTE — Assessment & Plan Note (Signed)
Improved markedly, treated by psych

## 2013-01-15 NOTE — Assessment & Plan Note (Signed)
Deteriorated. Patient re-educated about  the importance of commitment to a  minimum of 150 minutes of exercise per week. The importance of healthy food choices with portion control discussed. Encouraged to start a food diary, count calories and to consider  joining a support group. Sample diet sheets offered. Goals set by the patient for the next several months.    

## 2013-01-15 NOTE — Progress Notes (Signed)
  Subjective:    Patient ID: Rebecca Rodgers, female    DOB: 07/21/1955, 57 y.o.   MRN: [id number]  HPI The PT is here for follow up and re-evaluation of chronic medical conditions, medication management and review of any available recent lab and radiology data.  Preventive health is updated, specifically  Cancer screening and Immunization.    The PT denies any adverse reactions to current medications since the last visit.  There are no new concerns.  There are no specific complaints  Very happy and proud about smoking cessation, which she should be. Plans to work at weight loss, has been eating in excess, celebrating with family. Relationship with family and her partner are going well, feels she has no need for therapy, just sees psychiatry for her medciation      Review of Systems See HPI Denies recent fever or chills. Denies sinus pressure, nasal congestion, ear pain or sore throat. Denies chest congestion, productive cough or wheezing. Denies chest pains, palpitations and leg swelling Denies abdominal pain, nausea, vomiting,diarrhea or constipation.   Denies dysuria, frequency, hesitancy or incontinence. Back pain is less , needs less medication for control, but states that after housework, she has stiffness and discomfort, using on avg one daily at bedtime ultracet Denies headaches, seizures, numbness, or tingling. Denies depression, anxiety or insomnia. Denies skin break down or rash.        Objective:   Physical Exam  Patient alert and oriented and in no cardiopulmonary distress.  HEENT: No facial asymmetry, EOMI, no sinus tenderness,  oropharynx pink and moist.  Neck supple no adenopathy.  Chest: Clear to auscultation bilaterally.Decfreased though adequate air entry  CVS: S1, S2 no murmurs, no S3.  ABD: Soft non tender. Bowel sounds normal.  Ext: No edema  MS: decreased  ROM spine, adequate in shoulders, hips and knees.  Skin: Intact, no ulcerations or rash  noted.  Psych: Good eye contact, normal affect. Memory intact not anxious or depressed appearing.  CNS: CN 2-12 intact, power, tone and sensation normal throughout.       Assessment & Plan:

## 2013-01-15 NOTE — Assessment & Plan Note (Signed)
Reduced , therfore using at most 2 tabs daily, reduction in dose sent in Pt encouraged to continue to work on weight loss and regular exercise

## 2013-01-15 NOTE — Patient Instructions (Addendum)
Annual wellness with rectal in 4.5 month, call if yoyu need me before  Chem 7, cmp, TSH, HBA1C as soon as possible  Pls schedule your mammogram   Flu vaccine today  Congrats on smoking cessation!!    Commit to daily exercise and healthy food choices please so you lose weight

## 2013-01-15 NOTE — Assessment & Plan Note (Signed)
Updated lab needed Patient educated about the importance of limiting  Carbohydrate intake , the need to commit to daily physical activity for a minimum of 30 minutes , and to commit weight loss. The fact that changes in all these areas will reduce or eliminate all together the development of diabetes is stressed.    

## 2013-01-15 NOTE — Assessment & Plan Note (Signed)
Hyperlipidemia:Low fat diet discussed and encouraged.  Updated lab needed 

## 2013-01-15 NOTE — Assessment & Plan Note (Signed)
Controlled, no change in medication DASH diet and commitment to daily physical activity for a minimum of 30 minutes discussed and encouraged, as a part of hypertension management. The importance of attaining a healthy weight is also discussed.  

## 2013-01-20 ENCOUNTER — Other Ambulatory Visit: Payer: Self-pay | Admitting: Family Medicine

## 2013-01-20 ENCOUNTER — Ambulatory Visit: Payer: Self-pay | Admitting: Family Medicine

## 2013-01-20 LAB — HEMOGLOBIN A1C: Hgb A1c MFr Bld: 6 % — ABNORMAL HIGH (ref <5.7)

## 2013-01-21 LAB — COMPREHENSIVE METABOLIC PANEL
Albumin: 4.2 g/dL (ref 3.5–5.2)
BUN: 21 mg/dL (ref 6–23)
Calcium: 9.9 mg/dL (ref 8.4–10.5)
Chloride: 105 mEq/L (ref 96–112)
Creat: 1.31 mg/dL — ABNORMAL HIGH (ref 0.50–1.10)
Glucose, Bld: 96 mg/dL (ref 70–99)
Potassium: 4 mEq/L (ref 3.5–5.3)

## 2013-01-21 LAB — TSH: TSH: 2.194 u[IU]/mL (ref 0.350–4.500)

## 2013-01-21 LAB — LIPID PANEL
Cholesterol: 192 mg/dL (ref 0–200)
VLDL: 28 mg/dL (ref 0–40)

## 2013-01-25 ENCOUNTER — Other Ambulatory Visit: Payer: Self-pay | Admitting: Family Medicine

## 2013-01-25 DIAGNOSIS — Z139 Encounter for screening, unspecified: Secondary | ICD-10-CM

## 2013-01-26 ENCOUNTER — Other Ambulatory Visit: Payer: Self-pay | Admitting: Family Medicine

## 2013-02-05 ENCOUNTER — Ambulatory Visit (HOSPITAL_COMMUNITY)
Admission: RE | Admit: 2013-02-05 | Discharge: 2013-02-05 | Disposition: A | Discharge status: Home / Self Care | Payer: Medicare Other | Source: Ambulatory Visit | Attending: Family Medicine | Admitting: Family Medicine

## 2013-02-05 DIAGNOSIS — Z139 Encounter for screening, unspecified: Secondary | ICD-10-CM

## 2013-02-05 DIAGNOSIS — Z1231 Encounter for screening mammogram for malignant neoplasm of breast: Secondary | ICD-10-CM | POA: Insufficient documentation

## 2013-02-24 ENCOUNTER — Other Ambulatory Visit: Payer: Self-pay | Admitting: Family Medicine

## 2013-03-09 ENCOUNTER — Telehealth: Payer: Self-pay | Admitting: Family Medicine

## 2013-03-09 DIAGNOSIS — G479 Sleep disorder, unspecified: Secondary | ICD-10-CM

## 2013-03-09 NOTE — Telephone Encounter (Signed)
pls let pt know based on this , I am referring her to Dr Gerilyn Pilgrim for evaluation of sleep disorder. Referral has been entered

## 2013-03-26 ENCOUNTER — Other Ambulatory Visit (HOSPITAL_COMMUNITY): Payer: Self-pay | Admitting: Psychiatry

## 2013-04-02 ENCOUNTER — Ambulatory Visit (INDEPENDENT_AMBULATORY_CARE_PROVIDER_SITE_OTHER): Payer: Medicare HMO | Admitting: Psychiatry

## 2013-04-02 ENCOUNTER — Encounter (HOSPITAL_COMMUNITY): Payer: Self-pay | Admitting: Psychiatry

## 2013-04-02 VITALS — BP 130/78 | Ht 63.0 in | Wt 208.0 lb

## 2013-04-02 DIAGNOSIS — F329 Major depressive disorder, single episode, unspecified: Secondary | ICD-10-CM

## 2013-04-02 DIAGNOSIS — F319 Bipolar disorder, unspecified: Secondary | ICD-10-CM

## 2013-04-02 DIAGNOSIS — F3289 Other specified depressive episodes: Secondary | ICD-10-CM

## 2013-04-02 MED ORDER — LAMOTRIGINE 200 MG PO TABS: 200.0000 mg | ORAL_TABLET | Freq: Every day | ORAL | Status: DC

## 2013-04-02 MED ORDER — TRAZODONE HCL 100 MG PO TABS: 100.0000 mg | ORAL_TABLET | Freq: Every day | ORAL | Status: DC

## 2013-04-02 MED ORDER — OLANZAPINE 5 MG PO TABS: 5.0000 mg | ORAL_TABLET | Freq: Every day | ORAL | Status: DC
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
## 2013-06-01 ENCOUNTER — Encounter (INDEPENDENT_AMBULATORY_CARE_PROVIDER_SITE_OTHER): Payer: Self-pay

## 2013-06-01 ENCOUNTER — Encounter: Payer: Self-pay | Admitting: Family Medicine

## 2013-06-01 ENCOUNTER — Ambulatory Visit (INDEPENDENT_AMBULATORY_CARE_PROVIDER_SITE_OTHER): Payer: Medicare HMO | Admitting: Family Medicine

## 2013-06-01 VITALS — BP 122/70 | HR 84 | Resp 16 | Ht 63.0 in | Wt 207.1 lb

## 2013-06-01 DIAGNOSIS — Z0000 Encounter for general adult medical examination without abnormal findings: Secondary | ICD-10-CM

## 2013-06-01 DIAGNOSIS — E8881 Metabolic syndrome: Secondary | ICD-10-CM

## 2013-06-01 DIAGNOSIS — Z139 Encounter for screening, unspecified: Secondary | ICD-10-CM

## 2013-06-01 DIAGNOSIS — R7303 Prediabetes: Secondary | ICD-10-CM

## 2013-06-01 DIAGNOSIS — E039 Hypothyroidism, unspecified: Secondary | ICD-10-CM

## 2013-06-01 DIAGNOSIS — I10 Essential (primary) hypertension: Secondary | ICD-10-CM

## 2013-06-01 NOTE — Patient Instructions (Signed)
F/u in 4 month, call if you need me before   Lipid, cmp and eGFr, hBA1c, tSH, CBC, vit D  PLEASE call and schedule your sleep study  Cut back on voltaren tablets, and also on omeprazole , try to quit  It is important that you exercise regularly at least 30 minutes 5 times a week. If you develop chest pain, have severe difficulty breathing, or feel very tired, stop exercising immediately and seek medical attention  A healthy diet is rich in fruit, vegetables and whole grains. Poultry fish, nuts and beans are a healthy choice for protein rather then red meat. A low sodium diet and drinking 64 ounces of water daily is generally recommended. Oils and sweet should be limited. Carbohydrates especially for those who are diabetic or overweight, should be limited to 34-45 gram per meal. It is important to eat on a regular schedule, at least 3 times daily. Snacks should be primarily fruits, vegetables or nuts.   Fall Prevention and Home Safety Falls cause injuries and can affect all age groups. It is possible to prevent falls.  HOW TO PREVENT FALLS  Wear shoes with rubber soles that do not have an opening for your toes.  Keep the inside and outside of your house well lit.  Use night lights throughout your home.  Remove clutter from floors.  Clean up floor spills.  Remove throw rugs or fasten them to the floor with carpet tape.  Do not place electrical cords across pathways.  Put grab bars by your tub, shower, and toilet. Do not use towel bars as grab bars.  Put handrails on both sides of the stairway. Fix loose handrails.  Do not climb on stools or stepladders, if possible.  Do not wax your floors.  Repair uneven or unsafe sidewalks, walkways, or stairs.  Keep items you use a lot within reach.  Be aware of pets.  Keep emergency numbers next to the telephone.  Put smoke detectors in your home and near bedrooms. Ask your doctor what other things you can do to prevent  falls. Document Released: 01/05/2009 Document Revised: 09/10/2011 Document Reviewed: 06/11/2011 [facility] Patient Information 2014 [place], [place].

## 2013-06-01 NOTE — Progress Notes (Signed)
Subjective:    Patient ID: Rebecca Rodgers, female    DOB: 20-Feb-1956, 58 y.o.   MRN: [id number]  HPI Preventive Screening-Counseling & Management   Patient present here today for a Medicare annual wellness visit.   Current Problems (verified)   Medications Prior to Visit Allergies (verified)   PAST HISTORY  Family History  Social History In a relationship with her female  partner, for over 10 years, disabled since 2004.Still smokes cigarettes, wants to quit, h/o illicit drug and alcohol use which she has quit   Risk Factors  Current exercise habits:  4 days per week , will increase to 7 days  Dietary issues discussed:heart healthy diet , reduced sugar and carb  intake , reduced  cheese and butter intake , increase in fresh/frozen fruit  And vegetable   Cardiac risk factors: nicotine use  Depression Screen  (Note: if answer to either of the following is "Yes", a more complete depression screening is indicated)   Over the past two weeks, have you felt down, depressed or hopeless? No  Over the past two weeks, have you felt little interest or pleasure in doing things? No  Have you lost interest or pleasure in daily life? No  Do you often feel hopeless? No  Do you cry easily over simple problems? No   Activities of Daily Living  In your present state of health, do you have any difficulty performing the following activities?  Driving?: No, long distance over 2 hours back and knee pain Managing money?: No Feeding yourself?:No Getting from bed to chair?:No Climbing a flight of stairs?:yes, knees hurt Preparing food and eating?:No Bathing or showering?:No Getting dressed?:No Getting to the toilet?:No Using the toilet?:No Moving around from place to place?: No  Fall Risk Assessment In the past year have you fallen or had a near fall?:No Are you currently taking any medications that make you dizzy?:No   Hearing Difficulties: No Do you often ask people to speak up or  repeat themselves?:yes requires hearing aids, which she has been using since 2014 Do you experience ringing or noises in your ears?:No Do you have difficulty understanding soft or whispered voices?:No, has been wearing hearing aid since 12/2012  Cognitive Testing  Alert? Yes Normal Appearance?Yes  Oriented to person? Yes Place? Yes  Time? Yes  Displays appropriate judgment?Yes  Can read the correct time from a watch face? yes Are you having problems remembering things?No  Advanced Directives have been discussed with the patient?Yes , full code   List the Names of Other Physician/Practitioners you currently use: Psychiatry, eye specialist   Indicate any recent Medical Services you may have received from other than [facility] providers in the past year (date may be approximate).   Assessment:    Annual Wellness Exam   Plan:    During the course of the visit the patient was educated and counseled about appropriate screening and preventive services including:  A healthy diet is rich in fruit, vegetables and whole grains. Poultry fish, nuts and beans are a healthy choice for protein rather then red meat. A low sodium diet and drinking 64 ounces of water daily is generally recommended. Oils and sweet should be limited. Carbohydrates especially for those who are diabetic or overweight, should be limited to 30-45 gram per meal. It is important to eat on a regular schedule, at least 3 times daily. Snacks should be primarily fruits, vegetables or nuts. It is important that you exercise regularly at least 30 minutes 5  times a week. If you develop chest pain, have severe difficulty breathing, or feel very tired, stop exercising immediately and seek medical attention  Immunization reviewed and updated. Cancer screening reviewed and updated    Patient Instructions (the written plan) was given to the patient.  Medicare Attestation  I have personally reviewed:  The patient's medical and social history   Their use of alcohol, tobacco or illicit drugs  Their current medications and supplements  The patient's functional ability including ADLs,fall risks, home safety risks, cognitive, and hearing and visual impairment  Diet and physical activities  Evidence for depression or mood disorders  The patient's weight, height, BMI, and visual acuity have been recorded in the chart. I have made referrals, counseling, and provided education to the patient based on review of the above and I have provided the patient with a written personalized care plan for preventive services.      Review of Systems     Objective:   Physical Exam        Assessment & Plan:  Routine general medical examination at a health care facility Annual exam as documented. Counseling done  re healthy lifestyle involving commitment to 150 minutes exercise per week, heart healthy diet, and attaining healthy weight.The importance of adequate sleep also discussed. Regular seat belt use , is also discussed. Changes in health habits are decided on by the patient with goals and time frames  set for achieving them. Immunization and cancer screening needs are specifically addressed at this visit.

## 2013-06-11 ENCOUNTER — Other Ambulatory Visit (HOSPITAL_COMMUNITY): Payer: Self-pay

## 2013-06-11 DIAGNOSIS — G4730 Sleep apnea, unspecified: Secondary | ICD-10-CM

## 2013-06-14 ENCOUNTER — Other Ambulatory Visit: Payer: Self-pay | Admitting: Family Medicine

## 2013-06-14 LAB — CBC WITH DIFFERENTIAL/PLATELET
Basophils Absolute: 0 10*3/uL (ref 0.0–0.1)
Basophils Relative: 0 % (ref 0–1)
EOS PCT: 2 % (ref 0–5)
Eosinophils Absolute: 0.2 10*3/uL (ref 0.0–0.7)
HCT: 35.2 % — ABNORMAL LOW (ref 36.0–46.0)
HEMOGLOBIN: 11.7 g/dL — AB (ref 12.0–15.0)
LYMPHS ABS: 2 10*3/uL (ref 0.7–4.0)
LYMPHS PCT: 23 % (ref 12–46)
MCH: 27.8 pg (ref 26.0–34.0)
MCHC: 33.2 g/dL (ref 30.0–36.0)
MCV: 83.6 fL (ref 78.0–100.0)
MONOS PCT: 7 % (ref 3–12)
Monocytes Absolute: 0.6 10*3/uL (ref 0.1–1.0)
Neutro Abs: 5.8 10*3/uL (ref 1.7–7.7)
Neutrophils Relative %: 68 % (ref 43–77)
PLATELETS: 443 10*3/uL — AB (ref 150–400)
RBC: 4.21 MIL/uL (ref 3.87–5.11)
RDW: 16.6 % — ABNORMAL HIGH (ref 11.5–15.5)
WBC: 8.6 10*3/uL (ref 4.0–10.5)

## 2013-06-14 LAB — COMPLETE METABOLIC PANEL WITH GFR
ALK PHOS: 81 U/L (ref 39–117)
ALT: 13 U/L (ref 0–35)
AST: 15 U/L (ref 0–37)
Albumin: 4.2 g/dL (ref 3.5–5.2)
BUN: 19 mg/dL (ref 6–23)
CHLORIDE: 101 meq/L (ref 96–112)
CO2: 21 mEq/L (ref 19–32)
Calcium: 9.6 mg/dL (ref 8.4–10.5)
Creat: 1.1 mg/dL (ref 0.50–1.10)
GFR, Est African American: 64 mL/min
GFR, Est Non African American: 56 mL/min — ABNORMAL LOW
Glucose, Bld: 108 mg/dL — ABNORMAL HIGH (ref 70–99)
Potassium: 3.7 mEq/L (ref 3.5–5.3)
SODIUM: 139 meq/L (ref 135–145)
Total Bilirubin: 0.3 mg/dL (ref 0.2–1.2)
Total Protein: 7.3 g/dL (ref 6.0–8.3)

## 2013-06-14 LAB — LIPID PANEL
CHOL/HDL RATIO: 2.8 ratio
Cholesterol: 147 mg/dL (ref 0–200)
HDL: 52 mg/dL (ref >39)
LDL Cholesterol: 79 mg/dL (ref 0–99)
Triglycerides: 79 mg/dL (ref <150)
VLDL: 16 mg/dL (ref 0–40)

## 2013-06-14 LAB — TSH: TSH: 2.058 u[IU]/mL (ref 0.350–4.500)

## 2013-06-14 LAB — HEMOGLOBIN A1C
HEMOGLOBIN A1C: 6.3 % — AB (ref <5.7)
Mean Plasma Glucose: 134 mg/dL — ABNORMAL HIGH (ref <117)

## 2013-06-15 ENCOUNTER — Other Ambulatory Visit: Payer: Self-pay | Admitting: Family Medicine

## 2013-06-15 DIAGNOSIS — D126 Benign neoplasm of colon, unspecified: Secondary | ICD-10-CM

## 2013-06-15 DIAGNOSIS — D509 Iron deficiency anemia, unspecified: Secondary | ICD-10-CM

## 2013-06-15 DIAGNOSIS — K635 Polyp of colon: Secondary | ICD-10-CM

## 2013-06-15 LAB — FERRITIN: Ferritin: 17 ng/mL (ref 10–291)

## 2013-06-15 LAB — IRON: IRON: 39 ug/dL — AB (ref 42–145)

## 2013-06-15 LAB — VITAMIN D 25 HYDROXY (VIT D DEFICIENCY, FRACTURES): Vit D, 25-Hydroxy: 15 ng/mL — ABNORMAL LOW (ref 30–89)

## 2013-06-15 MED ORDER — ERGOCALCIFEROL 1.25 MG (50000 UT) PO CAPS: 50000.0000 [IU] | ORAL_CAPSULE | ORAL | Status: DC

## 2013-06-16 ENCOUNTER — Encounter: Payer: Self-pay | Admitting: Neurology

## 2013-06-16 ENCOUNTER — Ambulatory Visit: Payer: Medicare HMO | Attending: Neurology | Admitting: Sleep Medicine

## 2013-06-16 VITALS — Ht 63.0 in | Wt 207.0 lb

## 2013-06-16 DIAGNOSIS — G4730 Sleep apnea, unspecified: Secondary | ICD-10-CM | POA: Insufficient documentation

## 2013-06-25 ENCOUNTER — Other Ambulatory Visit (HOSPITAL_COMMUNITY): Payer: Self-pay | Admitting: Psychiatry

## 2013-06-25 ENCOUNTER — Other Ambulatory Visit: Payer: Self-pay | Admitting: Family Medicine

## 2013-06-26 NOTE — Sleep Study (Signed)
  [place] A. Merlene Laughter, MD     [email or web address]          LOCATION: SLEEP LAB FACILITY: [facility]  PHYSICIAN: Beyonka Pitney A. Merlene Rodgers, M.D.   DATE OF STUDY: 06/16/2013.  NOCTURNAL POLYSOMNOGRAM   REFERRING PHYSICIAN: [name].  INDICATIONS: 58 year old who presented with snoring, insomnia and hypersomnia.  MEDICATIONS:  Prior to Admission medications   Medication Sig Start Date End Date Taking? Authorizing Provider  amLODipine (NORVASC) 10 MG tablet TAKE 1 TABLET ONCE A DAY 02/24/13   Fayrene Helper, MD  clotrimazole-betamethasone (LOTRISONE) cream APPLY TO AFFECTED AREAS TWICE A DAY 09/30/12   Fayrene Helper, MD  CRESTOR 40 MG tablet TAKE 1 TABLET DAILY 02/24/13   Fayrene Helper, MD  ergocalciferol (VITAMIN D2) 50000 UNITS capsule Take 1 capsule (50,000 Units total) by mouth once a week. 06/15/13   Fayrene Helper, MD  fluticasone (FLONASE) 50 MCG/ACT nasal spray SPRAY 1 SPRAY IN EACH NOSTRIL ONCE DAILY. 01/28/12   Fayrene Helper, MD  lamoTRIgine (LAMICTAL) 200 MG tablet Take 1 tablet (200 mg total) by mouth daily. 04/02/13   Levonne Spiller, MD  levothyroxine (SYNTHROID, LEVOTHROID) 50 MCG tablet TAKE 1 TABLET DAILY MON THRU SATURDAY AND 1.5 ON SUNDAYS 07/02/12   Fayrene Helper, MD  OLANZapine (ZYPREXA) 5 MG tablet Take 1 tablet (5 mg total) by mouth at bedtime. 04/02/13   Levonne Spiller, MD  Omeprazole Magnesium 20.6 (20 BASE) MG CPDR Take by mouth 2 (two) times daily.      Historical Provider, MD  SYMBICORT 160-4.5 MCG/ACT inhaler 2 PUFFS 2 TIMES A DAY 02/24/13   Fayrene Helper, MD  traZODone (DESYREL) 100 MG tablet Take 1 tablet (100 mg total) by mouth at bedtime. 04/02/13   Levonne Spiller, MD  triamterene-hydrochlorothiazide [facility]) 37.5-25 MG per tablet TAKE 1 TABLET DAILY 01/26/13   Fayrene Helper, MD      EPWORTH SLEEPINESS SCALE: 14.   BMI: 37.   ARCHITECTURAL SUMMARY: Total recording time was 420 minutes. Sleep efficiency 67 %. Sleep  latency 95 minutes. REM latency 258 minutes. Stage NI 15 %, N2 57 % and N3 9 % and REM sleep 19 %.    RESPIRATORY DATA:  Baseline oxygen saturation is 96 %. The lowest saturation is 86 %. The diagnostic AHI is 32. The RDI is 37. The REM AHI is 37.  LIMB MOVEMENT SUMMARY: PLM index 5.   ELECTROCARDIOGRAM SUMMARY: Average heart rate is 75 with no significant dysrhythmias observed.   IMPRESSION:  1. Moderate to severe obstructive sleep apnea syndrome. The patient should be setup for a forward CPAP titration recording or AutoPap.  Thanks for this referral.  Rebecca Rodgers, M.D. Diplomat, Tax adviser of Sleep Medicine.

## 2013-06-28 NOTE — Assessment & Plan Note (Signed)
Annual exam as documented. Counseling done  re healthy lifestyle involving commitment to 150 minutes exercise per week, heart healthy diet, and attaining healthy weight.The importance of adequate sleep also discussed. Regular seat belt use , is also discussed. Changes in health habits are decided on by the patient with goals and time frames  set for achieving them. Immunization and cancer screening needs are specifically addressed at this visit.

## 2013-06-29 ENCOUNTER — Ambulatory Visit (INDEPENDENT_AMBULATORY_CARE_PROVIDER_SITE_OTHER): Payer: Medicare HMO | Admitting: Psychiatry

## 2013-06-29 ENCOUNTER — Encounter (HOSPITAL_COMMUNITY): Payer: Self-pay | Admitting: Psychiatry

## 2013-06-29 VITALS — BP 130/80 | Ht 63.0 in | Wt 206.0 lb

## 2013-06-29 DIAGNOSIS — F1011 Alcohol abuse, in remission: Secondary | ICD-10-CM

## 2013-06-29 DIAGNOSIS — F319 Bipolar disorder, unspecified: Secondary | ICD-10-CM

## 2013-06-29 DIAGNOSIS — F3289 Other specified depressive episodes: Secondary | ICD-10-CM

## 2013-06-29 DIAGNOSIS — F329 Major depressive disorder, single episode, unspecified: Secondary | ICD-10-CM

## 2013-06-29 MED ORDER — TRAZODONE HCL 100 MG PO TABS: 100.0000 mg | ORAL_TABLET | Freq: Every day | ORAL | Status: DC

## 2013-06-29 MED ORDER — LAMOTRIGINE 200 MG PO TABS: 200.0000 mg | ORAL_TABLET | Freq: Every day | ORAL | Status: DC

## 2013-06-29 MED ORDER — OLANZAPINE 5 MG PO TABS: 5.0000 mg | ORAL_TABLET | Freq: Every day | ORAL | Status: DC
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
## 2013-07-26 ENCOUNTER — Other Ambulatory Visit: Payer: Self-pay | Admitting: Family Medicine

## 2013-07-27 ENCOUNTER — Other Ambulatory Visit: Payer: Self-pay

## 2013-07-27 MED ORDER — FLUTICASONE PROPIONATE 50 MCG/ACT NA SUSP: NASAL | Status: DC

## 2013-08-03 ENCOUNTER — Telehealth: Payer: Self-pay | Admitting: Family Medicine

## 2013-08-03 DIAGNOSIS — G4733 Obstructive sleep apnea (adult) (pediatric): Secondary | ICD-10-CM

## 2013-08-04 ENCOUNTER — Telehealth: Payer: Self-pay | Admitting: Family Medicine

## 2013-08-04 NOTE — Telephone Encounter (Signed)
Done and will not get her c pap order what do you want to do

## 2013-08-04 NOTE — Telephone Encounter (Signed)
Old referral entered from Dec. New referral put in as requested

## 2013-08-04 NOTE — Telephone Encounter (Signed)
She will need to start over with [facility] if she decides that she does want her sleep apnea treated. Pls let me know so I can enter a new referral, she is aware of appropriate behavior where she expects to recveiove her health care

## 2013-08-06 NOTE — Telephone Encounter (Signed)
Patient did go to Dr Frances Maywood office and she did apologize for her actions and spoke with Raquel Sarna and she does not know if the Dr will still see or what she is going to ask but her c pap supplies have been faxed to company in [facility]

## 2013-08-06 NOTE — Telephone Encounter (Signed)
Rebecca Rodgers is going to ask Dr Merlene Laughter about seeing the patient and she will let me know

## 2013-08-06 NOTE — Telephone Encounter (Signed)
noted 

## 2013-08-10 ENCOUNTER — Encounter (INDEPENDENT_AMBULATORY_CARE_PROVIDER_SITE_OTHER): Payer: Self-pay | Admitting: Internal Medicine

## 2013-08-10 ENCOUNTER — Ambulatory Visit (INDEPENDENT_AMBULATORY_CARE_PROVIDER_SITE_OTHER): Payer: Medicare HMO | Admitting: Internal Medicine

## 2013-08-10 VITALS — BP 132/58 | HR 84 | Temp 98.4°F | Ht 63.0 in | Wt 204.3 lb

## 2013-08-10 DIAGNOSIS — D649 Anemia, unspecified: Secondary | ICD-10-CM

## 2013-08-10 NOTE — Patient Instructions (Signed)
OV in 3 months. CBC at visit. Will check stools.

## 2013-08-10 NOTE — Progress Notes (Signed)
Subjective:     Patient ID: Rebecca Rodgers, female   DOB: 1956-01-31, 58 y.o.   MRN: [id number]  HPI  Referred to our office by Dr. Moshe Cipro  She denies having any  GI problems.  Stools are greenish dark from the Iron. Started the iron in March. Appetite is good. No weight loss. Acid reflux controlled with Omeprazole BID. No abdominal pain. Usually has a BM x 3 a day. No melena or bright red rectal bleeding.  Takes Aleve about 3 times a week.  Family hx of colon cancergrandfather in his 2s.    CBC    Component Value Date/Time   WBC 8.6 06/14/2013 0859   RBC 4.21 06/14/2013 0859   HGB 11.7* 06/14/2013 0859   HCT 35.2* 06/14/2013 0859   PLT 443* 06/14/2013 0859   MCV 83.6 06/14/2013 0859   MCH 27.8 06/14/2013 0859   MCHC 33.2 06/14/2013 0859   RDW 16.6* 06/14/2013 0859   LYMPHSABS 2.0 06/14/2013 0859   MONOABS 0.6 06/14/2013 0859   EOSABS 0.2 06/14/2013 0859   BASOSABS 0.0 06/14/2013 0859    06/14/2013 Iron 39, Ferritin 17  2009 Hemoglobin 9.0  12/20/2010 Colonoscopy: Examination performed to cecum.  3 small polyps ablated via cold biopsy from sigmoid colon. 2 from proximal sigmoid colon was submitted in one container.  Single small diverticulum at splenic flexure.  Small external hemorrhoids  Biopsy: Hyperplastic polyp  04/02/2007 Colonoscopy: Large pedunculated polyps snare. External hemorrhoids. Biopsy; Tubulovillous adenoma.   Review of Systems Past Medical History  Diagnosis Date  . Hypertension   . Hyperlipidemia   . Hypothyroidism   . Asthma   . Hx of hysterectomy   . Suicide attempt 1997    when a relationship broke up    . Suicide attempt     3 months ago because of ill health of her parents   . Uterine cancer 2005    Past Surgical History  Procedure Laterality Date  . Vesicovaginal fistula closure w/ tah  2002    dur to uterine cancer   . Carpal tunnel release  1998    bilateral    . Dilation and curettage of uterus      at 20   . Colonoscopy  12/20/2010   Procedure: COLONOSCOPY;  Surgeon: Rogene Houston, MD;  Location: [facility];  Service: Endoscopy;  Laterality: N/A;  . Abdominal hysterectomy      Allergies  Allergen Reactions  . Codeine   . Penicillins     Current Outpatient Prescriptions on File Prior to Visit  Medication Sig Dispense Refill  . amLODipine (NORVASC) 10 MG tablet TAKE 1 TABLET ONCE A DAY  30 tablet  2  . clotrimazole-betamethasone (LOTRISONE) cream APPLY TO AFFECTED AREAS TWICE A DAY  45 g  0  . CRESTOR 40 MG tablet TAKE 1 TABLET DAILY  30 tablet  2  . ergocalciferol (VITAMIN D2) 50000 UNITS capsule Take 1 capsule (50,000 Units total) by mouth once a week.  4 capsule  5  . lamoTRIgine (LAMICTAL) 200 MG tablet Take 1 tablet (200 mg total) by mouth daily.  30 tablet  2  . levothyroxine (SYNTHROID, LEVOTHROID) 50 MCG tablet TAKE 1 TABLET DAILY MON-SAT AND 1&1/2 TABLETS ON SUN  30 tablet  2  . OLANZapine (ZYPREXA) 5 MG tablet Take 1 tablet (5 mg total) by mouth at bedtime.  30 tablet  2  . Omeprazole Magnesium 20.6 (20 BASE) MG CPDR Take by mouth 2 (two) times daily.        Marland Kitchen  traZODone (DESYREL) 100 MG tablet Take 1 tablet (100 mg total) by mouth at bedtime.  30 tablet  2  . triamterene-hydrochlorothiazide (MAXZIDE-25) 37.5-25 MG per tablet TAKE 1 TABLET DAILY  30 tablet  2  . fluticasone (FLONASE) 50 MCG/ACT nasal spray SPRAY 1 SPRAY IN EACH NOSTRIL ONCE DAILY.  16 g  2  . SYMBICORT 160-4.5 MCG/ACT inhaler 2 PUFFS 2 TIMES A DAY  10.2 g  4   No current facility-administered medications on file prior to visit.   She has a significant (female) other. No children. She does not work.     Objective:   Physical Exam  Filed Vitals:   08/10/13 1105  BP: 132/58  Pulse: 84  Temp: 98.4 F (36.9 C)  Height: 5\' 3"  (1.6 m)  Weight: 204 lb 4.8 oz (92.67 kg)  Alert and oriented. Skin warm and dry. Oral mucosa is moist.   . Sclera anicteric, conjunctivae is pink. Thyroid not enlarged. No cervical lymphadenopathy. Lungs clear.  Heart regular rate and rhythm.  Abdomen is soft. Bowel sounds are positive. No hepatomegaly. No abdominal masses felt. No tenderness.  No edema to lower extremities.  Stool brown and guaiac negative.       Assessment:     Anemia. Iron 39.  Presently taking iron. No GI symptoms. Heme negative in office. Has been on Omeprazole BID for over 5 yrs. I discussed this case with Dr. Laural Golden      Plan:     OV in 3 months with a CBC and will recheck her stools.

## 2013-08-11 ENCOUNTER — Telehealth (INDEPENDENT_AMBULATORY_CARE_PROVIDER_SITE_OTHER): Payer: Self-pay | Admitting: *Deleted

## 2013-08-11 DIAGNOSIS — D649 Anemia, unspecified: Secondary | ICD-10-CM

## 2013-08-11 NOTE — Telephone Encounter (Signed)
.  Per Lelon Perla patient to have labs drawn in 3 months. Lab is noted for August.

## 2013-08-24 ENCOUNTER — Other Ambulatory Visit: Payer: Self-pay | Admitting: Family Medicine

## 2013-09-24 ENCOUNTER — Other Ambulatory Visit: Payer: Self-pay | Admitting: Family Medicine

## 2013-09-24 ENCOUNTER — Other Ambulatory Visit (HOSPITAL_COMMUNITY): Payer: Self-pay | Admitting: Psychiatry

## 2013-09-28 ENCOUNTER — Other Ambulatory Visit (HOSPITAL_COMMUNITY): Payer: Self-pay | Admitting: Psychiatry

## 2013-09-28 ENCOUNTER — Encounter (HOSPITAL_COMMUNITY): Payer: Self-pay | Admitting: Psychiatry

## 2013-09-28 ENCOUNTER — Ambulatory Visit (INDEPENDENT_AMBULATORY_CARE_PROVIDER_SITE_OTHER): Payer: Medicare HMO | Admitting: Psychiatry

## 2013-09-28 VITALS — BP 134/70 | Ht 63.0 in | Wt 204.0 lb

## 2013-09-28 DIAGNOSIS — F3289 Other specified depressive episodes: Secondary | ICD-10-CM

## 2013-09-28 DIAGNOSIS — F329 Major depressive disorder, single episode, unspecified: Secondary | ICD-10-CM

## 2013-09-28 DIAGNOSIS — F319 Bipolar disorder, unspecified: Secondary | ICD-10-CM

## 2013-09-28 DIAGNOSIS — F1011 Alcohol abuse, in remission: Secondary | ICD-10-CM

## 2013-09-28 MED ORDER — LAMOTRIGINE 200 MG PO TABS: 200.0000 mg | ORAL_TABLET | Freq: Every day | ORAL | Status: DC

## 2013-09-28 MED ORDER — TRAZODONE HCL 100 MG PO TABS: 100.0000 mg | ORAL_TABLET | Freq: Every day | ORAL | Status: DC

## 2013-09-28 MED ORDER — OLANZAPINE 5 MG PO TABS: 5.0000 mg | ORAL_TABLET | Freq: Every day | ORAL | Status: DC
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
## 2013-10-13 ENCOUNTER — Encounter (INDEPENDENT_AMBULATORY_CARE_PROVIDER_SITE_OTHER): Payer: Self-pay | Admitting: *Deleted

## 2013-10-13 ENCOUNTER — Other Ambulatory Visit (INDEPENDENT_AMBULATORY_CARE_PROVIDER_SITE_OTHER): Payer: Self-pay | Admitting: *Deleted

## 2013-10-13 DIAGNOSIS — D649 Anemia, unspecified: Secondary | ICD-10-CM

## 2013-10-25 ENCOUNTER — Other Ambulatory Visit: Payer: Self-pay | Admitting: Family Medicine

## 2013-10-26 ENCOUNTER — Ambulatory Visit (INDEPENDENT_AMBULATORY_CARE_PROVIDER_SITE_OTHER): Payer: Medicare HMO | Admitting: Family Medicine

## 2013-10-26 ENCOUNTER — Other Ambulatory Visit (HOSPITAL_COMMUNITY)
Admission: RE | Admit: 2013-10-26 | Discharge: 2013-10-26 | Disposition: A | Discharge status: Home / Self Care | Payer: Medicare HMO | Source: Ambulatory Visit | Attending: Family Medicine | Admitting: Family Medicine

## 2013-10-26 ENCOUNTER — Encounter: Payer: Self-pay | Admitting: Family Medicine

## 2013-10-26 VITALS — BP 114/78 | HR 93 | Resp 16 | Ht 63.0 in | Wt 203.0 lb

## 2013-10-26 DIAGNOSIS — N760 Acute vaginitis: Secondary | ICD-10-CM

## 2013-10-26 DIAGNOSIS — R7303 Prediabetes: Secondary | ICD-10-CM

## 2013-10-26 DIAGNOSIS — M5430 Sciatica, unspecified side: Secondary | ICD-10-CM

## 2013-10-26 DIAGNOSIS — Z23 Encounter for immunization: Secondary | ICD-10-CM

## 2013-10-26 DIAGNOSIS — IMO0002 Reserved for concepts with insufficient information to code with codable children: Secondary | ICD-10-CM

## 2013-10-26 DIAGNOSIS — F3289 Other specified depressive episodes: Secondary | ICD-10-CM

## 2013-10-26 DIAGNOSIS — F329 Major depressive disorder, single episode, unspecified: Secondary | ICD-10-CM

## 2013-10-26 DIAGNOSIS — M5442 Lumbago with sciatica, left side: Secondary | ICD-10-CM

## 2013-10-26 DIAGNOSIS — R7309 Other abnormal glucose: Secondary | ICD-10-CM

## 2013-10-26 DIAGNOSIS — I10 Essential (primary) hypertension: Secondary | ICD-10-CM

## 2013-10-26 DIAGNOSIS — E039 Hypothyroidism, unspecified: Secondary | ICD-10-CM

## 2013-10-26 DIAGNOSIS — E785 Hyperlipidemia, unspecified: Secondary | ICD-10-CM

## 2013-10-26 MED ORDER — KETOROLAC TROMETHAMINE 60 MG/2ML IM SOLN
60.0000 mg | Freq: Once | INTRAMUSCULAR | Status: AC
  Administered 2013-10-26: 60 mg via INTRAMUSCULAR

## 2013-10-26 MED ORDER — METHYLPREDNISOLONE ACETATE 80 MG/ML IJ SUSP
80.0000 mg | Freq: Once | INTRAMUSCULAR | Status: AC
  Administered 2013-10-26: 80 mg via INTRAMUSCULAR

## 2013-10-26 MED ORDER — PREDNISONE 5 MG PO TABS: 5.0000 mg | ORAL_TABLET | Freq: Two times a day (BID) | ORAL | Status: AC

## 2013-10-26 MED ORDER — METFORMIN HCL 500 MG PO TABS: 500.0000 mg | ORAL_TABLET | Freq: Every day | ORAL | Status: DC

## 2013-10-26 MED ORDER — TIZANIDINE HCL 4 MG PO TABS: ORAL_TABLET | ORAL | Status: DC

## 2013-10-26 MED ORDER — IBUPROFEN 800 MG PO TABS: 800.0000 mg | ORAL_TABLET | Freq: Three times a day (TID) | ORAL | Status: DC

## 2013-10-26 NOTE — Progress Notes (Signed)
   Subjective:    Patient ID: Rebecca Rodgers, female    DOB: September 05, 1955, 58 y.o.   MRN: [id number]  HPI P tin with 3 day h/o severe back pain radiating down left leg, states lifting groceries aggravated her symptom. Denies lower ext weakness or numbness, no incontinence of stool or urine Remains nicotine free and proud to be so. 1 week h/o increased vaginal d/c with odor ,wants this checked Will recommit to health food choices, just returned from a family vacation on a cruise , she overdid things then   Review of Systems See HPI Denies recent fever or chills. Denies sinus pressure, nasal congestion, ear pain or sore throat. Denies chest congestion, productive cough or wheezing. Denies chest pains, palpitations and leg swelling Denies abdominal pain, nausea, vomiting,diarrhea or constipation.   Denies dysuria, frequency, hesitancy or incontinence.  Denies headaches, seizures, numbness, or tingling. Denies uncontrolled depression, anxiety or insomnia. Denies skin break down or rash.        Objective:   Physical Exam BP 114/78  Pulse 93  Resp 16  Ht 5\' 3"  (1.6 m)  Wt 203 lb (92.08 kg)  BMI 35.97 kg/m2  SpO2 98% Patient alert and oriented and in no cardiopulmonary distress.  HEENT: No facial asymmetry, EOMI,   oropharynx pink and moist.  Neck supple no JVD, no mass.  Chest: Clear to auscultation bilaterally.  CVS: S1, S2 no murmurs, no S3.Regular rate.  ABD: Soft non tender.  Pelvic : not done, pt self collected swabs, she has a hysterectomy.probap  Ext: No edema  MS: decreased ROM lumbar  Spine,adequate in  shoulders, hips and knees.  Skin: Intact, no ulcerations or rash noted.  Psych: Good eye contact, normal affect. Memory intact not anxious or depressed appearing.  CNS: CN 2-12 intact, power,  normal throughout.no focal deficits noted.        Assessment & Plan:  BACK PAIN WITH RADICULOPATHY Increased and uncontrolled pain x 3 days, anti inflammatories  in office, IM, and prescribed for use at home  ESSENTIAL HYPERTENSION Controlled, no change in medication DASH diet and commitment to daily physical activity for a minimum of 30 minutes discussed and encouraged, as a part of hypertension management. The importance of attaining a healthy weight is also discussed.   Prediabetes Patient educated about the importance of limiting  Carbohydrate intake , the need to commit to daily physical activity for a minimum of 30 minutes , and to commit weight loss. The fact that changes in all these areas will reduce or eliminate all together the development of diabetes is stressed.   Updated lab needed at/ before next visit.   UNSPECIFIED HYPOTHYROIDISM Controlled, no change in medication Updated lab needed at/ before next visit.   DEPRESSION controlle on current medication, treated by psychiatry  Other and unspecified hyperlipidemia Hyperlipidemia:Low fat diet discussed and encouraged. Controlled, no change in medication   Updated lab needed at/ before next visit.   Need for vaccination with 13-polyvalent pneumococcal conjugate vaccine Vaccine administered at visit  Vaginitis and vulvovaginitis Symptomatic x 1 week, self collected specimens submitted for testing

## 2013-10-26 NOTE — Patient Instructions (Addendum)
Annual wellness in 4 month, call if you need me before  Prevnar today.  Injections, in office and medications, ibuprofen , prednisone and zannaflex are prescribed   Specimens to be sent re vaginal discharge   Please continue to stay away from cigarettes  New for blood sugar is metformin one daily  hba1c AND CHEM 7 AND egfr ON 11/06/2012, NON FAST (nO OTHER LAB NEEDED AT THIS TIME F5ROM THIS OFFICE)  fASTING LIPID, CMP AND egfr, tsh, hba1c IN dECEMBER

## 2013-10-27 ENCOUNTER — Other Ambulatory Visit: Payer: Self-pay | Admitting: Family Medicine

## 2013-10-30 DIAGNOSIS — Z23 Encounter for immunization: Secondary | ICD-10-CM | POA: Insufficient documentation

## 2013-10-30 DIAGNOSIS — N760 Acute vaginitis: Secondary | ICD-10-CM | POA: Insufficient documentation

## 2013-10-30 NOTE — Assessment & Plan Note (Signed)
Controlled, no change in medication DASH diet and commitment to daily physical activity for a minimum of 30 minutes discussed and encouraged, as a part of hypertension management. The importance of attaining a healthy weight is also discussed.  

## 2013-10-30 NOTE — Assessment & Plan Note (Signed)
Controlled, no change in medication Updated lab needed at/ before next visit.  

## 2013-10-30 NOTE — Assessment & Plan Note (Signed)
Increased and uncontrolled pain x 3 days, anti inflammatories in office, IM, and prescribed for use at home

## 2013-10-30 NOTE — Assessment & Plan Note (Signed)
Symptomatic x 1 week, self collected specimens submitted for testing

## 2013-10-30 NOTE — Assessment & Plan Note (Signed)
Hyperlipidemia:Low fat diet discussed and encouraged.  Controlled, no change in medication Updated lab needed at/ before next visit.  

## 2013-10-30 NOTE — Assessment & Plan Note (Signed)
Patient educated about the importance of limiting  Carbohydrate intake , the need to commit to daily physical activity for a minimum of 30 minutes , and to commit weight loss. The fact that changes in all these areas will reduce or eliminate all together the development of diabetes is stressed.   Updated lab needed at/ before next visit.  

## 2013-10-30 NOTE — Assessment & Plan Note (Signed)
Vaccine administered at visit.  

## 2013-10-30 NOTE — Assessment & Plan Note (Signed)
controlle on current medication, treated by psychiatry

## 2013-11-05 LAB — HEMOGLOBIN A1C
Hgb A1c MFr Bld: 6.5 % — ABNORMAL HIGH (ref <5.7)
Mean Plasma Glucose: 140 mg/dL — ABNORMAL HIGH (ref <117)

## 2013-11-06 LAB — BASIC METABOLIC PANEL WITH GFR
BUN: 20 mg/dL (ref 6–23)
CO2: 23 meq/L (ref 19–32)
CREATININE: 1.12 mg/dL — AB (ref 0.50–1.10)
Calcium: 9.9 mg/dL (ref 8.4–10.5)
Chloride: 99 mEq/L (ref 96–112)
GFR, Est African American: 63 mL/min
GFR, Est Non African American: 55 mL/min — ABNORMAL LOW
Glucose, Bld: 91 mg/dL (ref 70–99)
Potassium: 3.8 mEq/L (ref 3.5–5.3)
Sodium: 135 mEq/L (ref 135–145)

## 2013-11-24 ENCOUNTER — Other Ambulatory Visit: Payer: Self-pay | Admitting: Family Medicine

## 2013-11-30 ENCOUNTER — Ambulatory Visit (INDEPENDENT_AMBULATORY_CARE_PROVIDER_SITE_OTHER): Payer: Self-pay | Admitting: Internal Medicine

## 2013-12-24 ENCOUNTER — Other Ambulatory Visit: Payer: Self-pay | Admitting: Family Medicine

## 2013-12-24 ENCOUNTER — Other Ambulatory Visit (HOSPITAL_COMMUNITY): Payer: Self-pay | Admitting: Psychiatry

## 2013-12-29 ENCOUNTER — Encounter (HOSPITAL_COMMUNITY): Payer: Self-pay | Admitting: Psychiatry

## 2013-12-29 ENCOUNTER — Ambulatory Visit (INDEPENDENT_AMBULATORY_CARE_PROVIDER_SITE_OTHER): Payer: Commercial Managed Care - HMO | Admitting: Psychiatry

## 2013-12-29 VITALS — BP 121/95 | HR 88 | Ht 63.0 in | Wt 191.8 lb

## 2013-12-29 DIAGNOSIS — F3162 Bipolar disorder, current episode mixed, moderate: Secondary | ICD-10-CM

## 2013-12-29 DIAGNOSIS — F319 Bipolar disorder, unspecified: Secondary | ICD-10-CM

## 2013-12-29 DIAGNOSIS — F1021 Alcohol dependence, in remission: Secondary | ICD-10-CM

## 2013-12-29 MED ORDER — OLANZAPINE 5 MG PO TABS: 5.0000 mg | ORAL_TABLET | Freq: Every day | ORAL | Status: DC

## 2013-12-29 MED ORDER — LAMOTRIGINE 200 MG PO TABS: 200.0000 mg | ORAL_TABLET | Freq: Every day | ORAL | Status: DC

## 2013-12-29 MED ORDER — TRAZODONE HCL 100 MG PO TABS: 100.0000 mg | ORAL_TABLET | Freq: Every day | ORAL | Status: DC
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
## 2014-02-23 ENCOUNTER — Other Ambulatory Visit: Payer: Self-pay | Admitting: Family Medicine

## 2014-03-01 ENCOUNTER — Telehealth: Payer: Self-pay | Admitting: *Deleted

## 2014-03-01 ENCOUNTER — Other Ambulatory Visit: Payer: Self-pay

## 2014-03-01 MED ORDER — LEVOTHYROXINE SODIUM 50 MCG PO TABS: ORAL_TABLET | ORAL | Status: DC

## 2014-03-01 NOTE — Telephone Encounter (Signed)
Med refilled.

## 2014-03-01 NOTE — Telephone Encounter (Signed)
Pt called requesting levothyroxine in [place]. Please advise

## 2014-03-10 ENCOUNTER — Encounter (INDEPENDENT_AMBULATORY_CARE_PROVIDER_SITE_OTHER): Payer: Self-pay

## 2014-03-10 ENCOUNTER — Ambulatory Visit (INDEPENDENT_AMBULATORY_CARE_PROVIDER_SITE_OTHER): Payer: Medicare HMO | Admitting: Family Medicine

## 2014-03-10 ENCOUNTER — Encounter: Payer: Self-pay | Admitting: Family Medicine

## 2014-03-10 VITALS — BP 120/82 | HR 89 | Resp 16 | Ht 63.0 in | Wt 191.0 lb

## 2014-03-10 DIAGNOSIS — R7303 Prediabetes: Secondary | ICD-10-CM

## 2014-03-10 DIAGNOSIS — I10 Essential (primary) hypertension: Secondary | ICD-10-CM

## 2014-03-10 DIAGNOSIS — D6489 Other specified anemias: Secondary | ICD-10-CM

## 2014-03-10 DIAGNOSIS — E559 Vitamin D deficiency, unspecified: Secondary | ICD-10-CM

## 2014-03-10 DIAGNOSIS — Z0000 Encounter for general adult medical examination without abnormal findings: Secondary | ICD-10-CM | POA: Insufficient documentation

## 2014-03-10 DIAGNOSIS — E039 Hypothyroidism, unspecified: Secondary | ICD-10-CM

## 2014-03-10 NOTE — Patient Instructions (Addendum)
F/u in 4.5 month, call if you need me before  Fasting lipid, cmp HBa1C, TSH, CBC , iron and ferritin, vit D in February   Please work on smoking cessation  Congrats on weight loss  After you finish current weekly vit D, then start daily OTC vit D 3 800 IU daily   Please work on living will  All the best for 2016

## 2014-03-10 NOTE — Progress Notes (Signed)
Subjective:    Patient ID: Rebecca Rodgers, female    DOB: 22-Feb-1956, 58 y.o.   MRN: [id number]  HPI Preventive Screening-Counseling & Management   Patient present here today for a Medicare annual wellness visit.   Current Problems (verified)   Medications Prior to Visit Allergies (verified)   PAST HISTORY  Family History (verified)   Social History  Lives with her girlfriend, disabled    Risk Factors  Current exercise habits:  Goes to [employer] 3 nights a week and walks almost daily down the road   Dietary issues discussed: Heart healthy diet discussed- encouraged to limit fried fatty foods, and to eat more chicken fish and [place]   Cardiac risk factors: Father had CAD  Depression Screen  (Note: if answer to either of the following is "Yes", a more complete depression screening is indicated)   Over the past two weeks, have you felt down, depressed or hopeless? yes Over the past two weeks, have you felt little interest or pleasure in doing things? No  Have you lost interest or pleasure in daily life? No  Do you often feel hopeless? Somewhat  Do you cry easily over simple problems? Yes    Activities of Daily Living  In your present state of health, do you have any difficulty performing the following activities?  Driving?: No Managing money?: No Feeding yourself?:No Getting from bed to chair?:No Climbing a flight of stairs?:No Preparing food and eating?:No Bathing or showering?:No Getting dressed?:No Getting to the toilet?:No Using the toilet?:No Moving around from place to place?: No  Fall Risk Assessment In the past year have you fallen or had a near fall?:No Are you currently taking any medications that make you dizzy?:No   Hearing Difficulties: No Do you often ask people to speak up or repeat themselves?: yes (has to wear hearing aide since 2014)  Do you experience ringing or noises in your ears?:No Do you have difficulty understanding soft or whispered  voices?:No  Cognitive Testing  Alert? Yes Normal Appearance?Yes  Oriented to person? Yes Place? Yes  Time? Yes  Displays appropriate judgment?Yes  Can read the correct time from a watch face? yes Are you having problems remembering things? no  Advanced Directives have been discussed with the patient? Doesn't have a will but brochure given    List the Names of Other Physician/Practitioners you currently use:  Dr Harrington Challenger (psych)   Indicate any recent Medical Services you may have received from other than [facility] providers in the past year (date may be approximate).   Assessment:    Annual Wellness Exam   Plan:    Medicare Attestation  I have personally reviewed:  The patient's medical and social history  Their use of alcohol, tobacco or illicit drugs  Their current medications and supplements  The patient's functional ability including ADLs,fall risks, home safety risks, cognitive, and hearing and visual impairment  Diet and physical activities  Evidence for depression or mood disorders  The patient's weight, height, BMI, and visual acuity have been recorded in the chart. I have made referrals, counseling, and provided education to the patient based on review of the above and I have provided the patient with a written personalized care plan for preventive services.      Review of Systems     Objective:   Physical Exam BP 120/82 mmHg  Pulse 89  Resp 16  Ht 5\' 3"  (1.6 m)  Wt 191 lb (86.637 kg)  BMI 33.84 kg/m2  SpO2 99%  Assessment & Plan:  Medicare annual wellness visit, subsequent Annual exam as documented. Counseling done  re healthy lifestyle involving commitment to 150 minutes exercise per week, heart healthy diet, and attaining healthy weight.The importance of adequate sleep also discussed. Regular seat belt use and home safety, is also discussed. Changes in health habits are decided on by the patient with goals and time frames  set for achieving  them. Immunization and cancer screening needs are specifically addressed at this visit.

## 2014-03-10 NOTE — Assessment & Plan Note (Signed)

## 2014-03-26 ENCOUNTER — Other Ambulatory Visit: Payer: Self-pay | Admitting: Family Medicine

## 2014-03-31 ENCOUNTER — Encounter (HOSPITAL_COMMUNITY): Payer: Self-pay | Admitting: Psychiatry

## 2014-03-31 ENCOUNTER — Ambulatory Visit (INDEPENDENT_AMBULATORY_CARE_PROVIDER_SITE_OTHER): Payer: Commercial Managed Care - HMO | Admitting: Psychiatry

## 2014-03-31 VITALS — BP 102/81 | HR 86 | Ht 63.0 in | Wt 194.0 lb

## 2014-03-31 DIAGNOSIS — F1021 Alcohol dependence, in remission: Secondary | ICD-10-CM

## 2014-03-31 DIAGNOSIS — F319 Bipolar disorder, unspecified: Secondary | ICD-10-CM | POA: Diagnosis not present

## 2014-03-31 DIAGNOSIS — F3162 Bipolar disorder, current episode mixed, moderate: Secondary | ICD-10-CM

## 2014-03-31 MED ORDER — TRAZODONE HCL 100 MG PO TABS: 100.0000 mg | ORAL_TABLET | Freq: Every day | ORAL | Status: DC

## 2014-03-31 MED ORDER — OLANZAPINE 5 MG PO TABS: 5.0000 mg | ORAL_TABLET | Freq: Every day | ORAL | Status: DC

## 2014-03-31 MED ORDER — LAMOTRIGINE 200 MG PO TABS: 200.0000 mg | ORAL_TABLET | Freq: Every day | ORAL | Status: DC
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
## 2014-04-07 ENCOUNTER — Other Ambulatory Visit: Payer: Self-pay | Admitting: Family Medicine

## 2014-04-07 DIAGNOSIS — Z1231 Encounter for screening mammogram for malignant neoplasm of breast: Secondary | ICD-10-CM

## 2014-04-13 ENCOUNTER — Ambulatory Visit (HOSPITAL_COMMUNITY)
Admission: RE | Admit: 2014-04-13 | Discharge: 2014-04-13 | Disposition: A | Discharge status: Home / Self Care | Payer: Commercial Managed Care - HMO | Source: Ambulatory Visit | Attending: Family Medicine | Admitting: Family Medicine

## 2014-04-13 DIAGNOSIS — Z1231 Encounter for screening mammogram for malignant neoplasm of breast: Secondary | ICD-10-CM | POA: Diagnosis not present

## 2014-04-27 DIAGNOSIS — E039 Hypothyroidism, unspecified: Secondary | ICD-10-CM | POA: Diagnosis not present

## 2014-04-27 DIAGNOSIS — I10 Essential (primary) hypertension: Secondary | ICD-10-CM | POA: Diagnosis not present

## 2014-04-27 DIAGNOSIS — D539 Nutritional anemia, unspecified: Secondary | ICD-10-CM | POA: Diagnosis not present

## 2014-04-27 DIAGNOSIS — D6489 Other specified anemias: Secondary | ICD-10-CM | POA: Diagnosis not present

## 2014-04-27 DIAGNOSIS — R7309 Other abnormal glucose: Secondary | ICD-10-CM | POA: Diagnosis not present

## 2014-04-27 DIAGNOSIS — E559 Vitamin D deficiency, unspecified: Secondary | ICD-10-CM | POA: Diagnosis not present

## 2014-04-27 LAB — CBC
HEMATOCRIT: 40.8 % (ref 36.0–46.0)
Hemoglobin: 13.4 g/dL (ref 12.0–15.0)
MCH: 28.6 pg (ref 26.0–34.0)
MCHC: 32.8 g/dL (ref 30.0–36.0)
MCV: 87.2 fL (ref 78.0–100.0)
MPV: 10.8 fL (ref 9.4–12.4)
Platelets: 356 10*3/uL (ref 150–400)
RBC: 4.68 MIL/uL (ref 3.87–5.11)
RDW: 16.4 % — ABNORMAL HIGH (ref 11.5–15.5)
WBC: 9.5 10*3/uL (ref 4.0–10.5)

## 2014-04-28 ENCOUNTER — Other Ambulatory Visit: Payer: Self-pay | Admitting: Family Medicine

## 2014-04-28 LAB — COMPREHENSIVE METABOLIC PANEL
ALK PHOS: 65 U/L (ref 39–117)
ALT: 14 U/L (ref 0–35)
AST: 17 U/L (ref 0–37)
Albumin: 4.4 g/dL (ref 3.5–5.2)
BUN: 20 mg/dL (ref 6–23)
CALCIUM: 9.8 mg/dL (ref 8.4–10.5)
CHLORIDE: 103 meq/L (ref 96–112)
CO2: 23 meq/L (ref 19–32)
CREATININE: 1 mg/dL (ref 0.50–1.10)
Glucose, Bld: 104 mg/dL — ABNORMAL HIGH (ref 70–99)
POTASSIUM: 3.9 meq/L (ref 3.5–5.3)
Sodium: 139 mEq/L (ref 135–145)
TOTAL PROTEIN: 7.8 g/dL (ref 6.0–8.3)
Total Bilirubin: 0.3 mg/dL (ref 0.2–1.2)

## 2014-04-28 LAB — HEMOGLOBIN A1C
Hgb A1c MFr Bld: 6 % — ABNORMAL HIGH (ref <5.7)
Mean Plasma Glucose: 126 mg/dL — ABNORMAL HIGH (ref <117)

## 2014-04-28 LAB — LIPID PANEL
Cholesterol: 165 mg/dL (ref 0–200)
HDL: 69 mg/dL (ref >39)
LDL Cholesterol: 79 mg/dL (ref 0–99)
Total CHOL/HDL Ratio: 2.4 Ratio
Triglycerides: 84 mg/dL (ref <150)
VLDL: 17 mg/dL (ref 0–40)

## 2014-04-28 LAB — TSH: TSH: 3.136 u[IU]/mL (ref 0.350–4.500)

## 2014-04-28 LAB — FERRITIN: Ferritin: 53 ng/mL (ref 10–291)

## 2014-04-28 LAB — IRON: IRON: 68 ug/dL (ref 42–145)

## 2014-04-28 LAB — VITAMIN D 25 HYDROXY (VIT D DEFICIENCY, FRACTURES): VIT D 25 HYDROXY: 58 ng/mL (ref 30–100)

## 2014-05-02 ENCOUNTER — Telehealth: Payer: Self-pay | Admitting: Family Medicine

## 2014-05-03 ENCOUNTER — Telehealth: Payer: Self-pay | Admitting: *Deleted

## 2014-05-03 NOTE — Telephone Encounter (Signed)
Patient aware of lab results.  Does not want have blood type done.

## 2014-05-03 NOTE — Telephone Encounter (Signed)
Pt called stating she got a paper and she thinks it is about her sugar count but it not sure, pt is requesting a nurse to call her back at [phone]

## 2014-05-03 NOTE — Telephone Encounter (Signed)
See result note sent , let her knwo blood type not tested this is an additional cost, not ordered

## 2014-05-03 NOTE — Telephone Encounter (Signed)
Patient does not want additional testing.

## 2014-06-30 ENCOUNTER — Other Ambulatory Visit (HOSPITAL_COMMUNITY): Payer: Self-pay | Admitting: Psychiatry

## 2014-06-30 ENCOUNTER — Ambulatory Visit (INDEPENDENT_AMBULATORY_CARE_PROVIDER_SITE_OTHER): Payer: Commercial Managed Care - HMO | Admitting: Psychiatry

## 2014-06-30 ENCOUNTER — Encounter (HOSPITAL_COMMUNITY): Payer: Self-pay | Admitting: Psychiatry

## 2014-06-30 VITALS — BP 112/75 | HR 80 | Ht 63.0 in | Wt 192.4 lb

## 2014-06-30 DIAGNOSIS — F319 Bipolar disorder, unspecified: Secondary | ICD-10-CM | POA: Diagnosis not present

## 2014-06-30 DIAGNOSIS — F1021 Alcohol dependence, in remission: Secondary | ICD-10-CM | POA: Diagnosis not present

## 2014-06-30 DIAGNOSIS — F3162 Bipolar disorder, current episode mixed, moderate: Secondary | ICD-10-CM

## 2014-06-30 MED ORDER — TRAZODONE HCL 100 MG PO TABS
100.0000 mg | ORAL_TABLET | Freq: Every day | ORAL | Status: DC
Start: 2014-06-30 — End: 2014-09-29

## 2014-06-30 MED ORDER — LAMOTRIGINE 200 MG PO TABS: 200.0000 mg | ORAL_TABLET | Freq: Every day | ORAL | Status: DC

## 2014-06-30 MED ORDER — OLANZAPINE 5 MG PO TABS: 5.0000 mg | ORAL_TABLET | Freq: Every day | ORAL | Status: DC
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
## 2014-07-11 ENCOUNTER — Ambulatory Visit (INDEPENDENT_AMBULATORY_CARE_PROVIDER_SITE_OTHER): Payer: Commercial Managed Care - HMO | Admitting: Family Medicine

## 2014-07-11 ENCOUNTER — Encounter: Payer: Self-pay | Admitting: Family Medicine

## 2014-07-11 VITALS — BP 118/78 | HR 82 | Resp 16 | Ht 63.0 in | Wt 192.0 lb

## 2014-07-11 DIAGNOSIS — E8881 Metabolic syndrome: Secondary | ICD-10-CM

## 2014-07-11 DIAGNOSIS — E039 Hypothyroidism, unspecified: Secondary | ICD-10-CM

## 2014-07-11 DIAGNOSIS — Z1159 Encounter for screening for other viral diseases: Secondary | ICD-10-CM

## 2014-07-11 DIAGNOSIS — E669 Obesity, unspecified: Secondary | ICD-10-CM

## 2014-07-11 DIAGNOSIS — R7303 Prediabetes: Secondary | ICD-10-CM

## 2014-07-11 DIAGNOSIS — I10 Essential (primary) hypertension: Secondary | ICD-10-CM

## 2014-07-11 DIAGNOSIS — R7309 Other abnormal glucose: Secondary | ICD-10-CM | POA: Diagnosis not present

## 2014-07-11 DIAGNOSIS — D508 Other iron deficiency anemias: Secondary | ICD-10-CM

## 2014-07-11 MED ORDER — TRIAMTERENE-HCTZ 37.5-25 MG PO TABS: 1.0000 | ORAL_TABLET | Freq: Every day | ORAL | Status: DC

## 2014-07-11 MED ORDER — AMLODIPINE BESYLATE 10 MG PO TABS: 10.0000 mg | ORAL_TABLET | Freq: Every day | ORAL | Status: DC

## 2014-07-11 NOTE — Patient Instructions (Addendum)
Physical exam in 3 months, call if you need me before  COngrats on smoking cessation, put smiley face/ x 's every day on your calendar, 1800 QUIT NOW is easy access  Log on calender every day you exercise  GREAT that you are now at the gym  Reduce iron to every other day  You are referred to diettian to help wih eating  Goal of weight loss5 pounds  HBA1C, chem7 and EGFr and TSH, HIV non fast in 3 month

## 2014-08-03 ENCOUNTER — Other Ambulatory Visit: Payer: Self-pay

## 2014-08-03 MED ORDER — METFORMIN HCL 500 MG PO TABS: 500.0000 mg | ORAL_TABLET | Freq: Every day | ORAL | Status: DC

## 2014-08-03 MED ORDER — ROSUVASTATIN CALCIUM 40 MG PO TABS: 40.0000 mg | ORAL_TABLET | Freq: Every day | ORAL | Status: DC

## 2014-08-17 ENCOUNTER — Other Ambulatory Visit: Payer: Self-pay | Admitting: Family Medicine

## 2014-08-25 ENCOUNTER — Other Ambulatory Visit: Payer: Self-pay | Admitting: Family Medicine

## 2014-09-07 NOTE — Assessment & Plan Note (Signed)
Improved , reduce dose of iron supplementation

## 2014-09-07 NOTE — Assessment & Plan Note (Signed)
Controlled, no change in medication DASH diet and commitment to daily physical activity for a minimum of 30 minutes discussed and encouraged, as a part of hypertension management. The importance of attaining a healthy weight is also discussed.  BP/Weight 07/11/2014 03/10/2014 10/26/2013 08/10/2013 06/16/2013 06/01/2013 69/67/8938  Systolic BP 101 751 025 852 - 778 242  Diastolic BP 78 82 78 58 - 70 78  Wt. (Lbs) 192 191 203 204.3 207 207.12 201  BMI 34.02 33.84 35.97 36.2 36.68 36.7 35.61  Some encounter information is confidential and restricted. Go to Review Flowsheets activity to see all data.

## 2014-09-07 NOTE — Assessment & Plan Note (Signed)
Patient educated about the importance of limiting  Carbohydrate intake , the need to commit to daily physical activity for a minimum of 30 minutes , and to commit weight loss. The fact that changes in all these areas will reduce or eliminate all together the development of diabetes is stressed.  Improved  Diabetic Labs Latest Ref Rng 04/27/2014 11/05/2013 06/14/2013 01/20/2013 08/07/2012  HbA1c <5.7 % 6.0(H) 6.5(H) 6.3(H) 6.0(H) 6.0(H)  Chol 0 - 200 mg/dL 165 - 147 192 190  HDL >39 mg/dL 69 - 52 59 55  Calc LDL 0 - 99 mg/dL 79 - 79 105(H) 101(H)  Triglycerides <150 mg/dL 84 - 79 139 172(H)  Creatinine 0.50 - 1.10 mg/dL 1.00 1.12(H) 1.10 1.31(H) 1.18(H)   BP/Weight 07/11/2014 03/10/2014 10/26/2013 08/10/2013 06/16/2013 06/01/2013 28/36/6294  Systolic BP 765 465 035 465 - 681 275  Diastolic BP 78 82 78 58 - 70 78  Wt. (Lbs) 192 191 203 204.3 207 207.12 201  BMI 34.02 33.84 35.97 36.2 36.68 36.7 35.61  Some encounter information is confidential and restricted. Go to Review Flowsheets activity to see all data.   No flowsheet data found.

## 2014-09-07 NOTE — Progress Notes (Signed)
Rebecca Rodgers     MRN: [id number]      DOB: 12/17/1955   HPI Ms. Rayburn is here for follow up and re-evaluation of chronic medical conditions, medication management and review of any available recent lab and radiology data.  Preventive health is updated, specifically  Cancer screening and Immunization.   Questions or concerns regarding consultations or procedures which the PT has had in the interim are  addressed. The PT denies any adverse reactions to current medications since the last visit.  There are no new concerns.  There are no specific complaints   ROS Denies recent fever or chills. Denies sinus pressure, nasal congestion, ear pain or sore throat. Denies chest congestion, productive cough or wheezing. Denies chest pains, palpitations and leg swelling Denies abdominal pain, nausea, vomiting,diarrhea or constipation.   Denies dysuria, frequency, hesitancy or incontinence. Denies joint pain, swelling and limitation in mobility. Denies headaches, seizures, numbness, or tingling. Denies depression, anxiety or insomnia. Denies skin break down or rash.   PE  BP 118/78 mmHg  Pulse 82  Resp 16  Ht 5\' 3"  (1.6 m)  Wt 192 lb (87.091 kg)  BMI 34.02 kg/m2  SpO2 100%  Patient alert and oriented and in no cardiopulmonary distress.  HEENT: No facial asymmetry, EOMI,   oropharynx pink and moist.  Neck supple no JVD, no mass.  Chest: Clear to auscultation bilaterally.  CVS: S1, S2 no murmurs, no S3.Regular rate.  ABD: Soft non tender.   Ext: No edema  MS: Adequate ROM spine, shoulders, hips and knees.  Skin: Intact, no ulcerations or rash noted.  Psych: Good eye contact, normal affect. Memory intact not anxious or depressed appearing.  CNS: CN 2-12 intact, power,  normal throughout.no focal deficits noted.   Assessment & Plan   Essential hypertension Controlled, no change in medication DASH diet and commitment to daily physical activity for a minimum of 30 minutes  discussed and encouraged, as a part of hypertension management. The importance of attaining a healthy weight is also discussed.  BP/Weight 07/11/2014 03/10/2014 10/26/2013 08/10/2013 06/16/2013 06/01/2013 40/98/1191  Systolic BP 478 295 621 308 - 657 846  Diastolic BP 78 82 78 58 - 70 78  Wt. (Lbs) 192 191 203 204.3 207 207.12 201  BMI 34.02 33.84 35.97 36.2 36.68 36.7 35.61  Some encounter information is confidential and restricted. Go to Review Flowsheets activity to see all data.        Hypothyroidism Controlled, no change in medication   Prediabetes Patient educated about the importance of limiting  Carbohydrate intake , the need to commit to daily physical activity for a minimum of 30 minutes , and to commit weight loss. The fact that changes in all these areas will reduce or eliminate all together the development of diabetes is stressed.  Improved  Diabetic Labs Latest Ref Rng 04/27/2014 11/05/2013 06/14/2013 01/20/2013 08/07/2012  HbA1c <5.7 % 6.0(H) 6.5(H) 6.3(H) 6.0(H) 6.0(H)  Chol 0 - 200 mg/dL 165 - 147 192 190  HDL >39 mg/dL 69 - 52 59 55  Calc LDL 0 - 99 mg/dL 79 - 79 105(H) 101(H)  Triglycerides <150 mg/dL 84 - 79 139 172(H)  Creatinine 0.50 - 1.10 mg/dL 1.00 1.12(H) 1.10 1.31(H) 1.18(H)   BP/Weight 07/11/2014 03/10/2014 10/26/2013 08/10/2013 06/16/2013 06/01/2013 96/29/5284  Systolic BP 132 440 102 725 - 366 440  Diastolic BP 78 82 78 58 - 70 78  Wt. (Lbs) 192 191 203 204.3 207 207.12 201  BMI 34.02 33.84  35.97 36.2 36.68 36.7 35.61  Some encounter information is confidential and restricted. Go to Review Flowsheets activity to see all data.   No flowsheet data found.     Obesity Unchanged. Patient re-educated about  the importance of commitment to a  minimum of 150 minutes of exercise per week.  The importance of healthy food choices with portion control discussed. Encouraged to start a food diary, count calories and to consider  joining a support group. Sample diet  sheets offered. Goals set by the patient for the next several months.   Weight /BMI 07/11/2014 03/10/2014 10/26/2013  WEIGHT 192 lb 191 lb 203 lb  HEIGHT 5\' 3"  5\' 3"  5\' 3"   BMI 34.02 kg/m2 33.84 kg/m2 35.97 kg/m2  Some encounter information is confidential and restricted. Go to Review Flowsheets activity to see all data.    Current exercise per week  100 minutes.   DEPRESSION Stable and controlled, managed by psych  Metabolic syndrome X The increased risk of cardiovascular disease associated with this diagnosis, and the need to consistently work on lifestyle to change this is discussed. Following  a  heart healthy diet ,commitment to 30 minutes of exercise at least 5 days per week, as well as control of blood sugar and cholesterol , and achieving a healthy weight are all the areas to be addressed .   Anemia Improved , reduce dose of iron supplementation

## 2014-09-07 NOTE — Assessment & Plan Note (Signed)
The increased risk of cardiovascular disease associated with this diagnosis, and the need to consistently work on lifestyle to change this is discussed. Following  a  heart healthy diet ,commitment to 30 minutes of exercise at least 5 days per week, as well as control of blood sugar and cholesterol , and achieving a healthy weight are all the areas to be addressed .  

## 2014-09-07 NOTE — Assessment & Plan Note (Signed)
Stable and controlled, managed by psych

## 2014-09-07 NOTE — Assessment & Plan Note (Signed)
Unchanged. Patient re-educated about  the importance of commitment to a  minimum of 150 minutes of exercise per week.  The importance of healthy food choices with portion control discussed. Encouraged to start a food diary, count calories and to consider  joining a support group. Sample diet sheets offered. Goals set by the patient for the next several months.   Weight /BMI 07/11/2014 03/10/2014 10/26/2013  WEIGHT 192 lb 191 lb 203 lb  HEIGHT 5\' 3"  5\' 3"  5\' 3"   BMI 34.02 kg/m2 33.84 kg/m2 35.97 kg/m2  Some encounter information is confidential and restricted. Go to Review Flowsheets activity to see all data.    Current exercise per week  100 minutes.

## 2014-09-07 NOTE — Assessment & Plan Note (Signed)
Controlled, no change in medication  

## 2014-09-22 ENCOUNTER — Other Ambulatory Visit (HOSPITAL_COMMUNITY): Payer: Self-pay | Admitting: Psychiatry

## 2014-09-29 ENCOUNTER — Ambulatory Visit (INDEPENDENT_AMBULATORY_CARE_PROVIDER_SITE_OTHER): Payer: Commercial Managed Care - HMO | Admitting: Psychiatry

## 2014-09-29 ENCOUNTER — Telehealth (HOSPITAL_COMMUNITY): Payer: Self-pay | Admitting: *Deleted

## 2014-09-29 ENCOUNTER — Encounter (HOSPITAL_COMMUNITY): Payer: Self-pay | Admitting: Psychiatry

## 2014-09-29 VITALS — BP 116/57 | HR 77 | Ht 63.0 in | Wt 192.2 lb

## 2014-09-29 DIAGNOSIS — F1021 Alcohol dependence, in remission: Secondary | ICD-10-CM | POA: Diagnosis not present

## 2014-09-29 DIAGNOSIS — F3162 Bipolar disorder, current episode mixed, moderate: Secondary | ICD-10-CM

## 2014-09-29 DIAGNOSIS — F319 Bipolar disorder, unspecified: Secondary | ICD-10-CM

## 2014-09-29 MED ORDER — TRAZODONE HCL 100 MG PO TABS: 100.0000 mg | ORAL_TABLET | Freq: Every day | ORAL | Status: DC

## 2014-09-29 MED ORDER — OLANZAPINE 5 MG PO TABS
5.0000 mg | ORAL_TABLET | Freq: Every day | ORAL | Status: DC
Start: 2014-09-29 — End: 2014-11-10

## 2014-09-29 MED ORDER — BUPROPION HCL 75 MG PO TABS: 75.0000 mg | ORAL_TABLET | Freq: Two times a day (BID) | ORAL | Status: DC

## 2014-09-29 MED ORDER — LAMOTRIGINE 200 MG PO TABS: 200.0000 mg | ORAL_TABLET | Freq: Every day | ORAL | Status: DC

## 2014-09-29 NOTE — Telephone Encounter (Signed)
My mistake, please tell them it should just be q day, not bid

## 2014-09-29 NOTE — Telephone Encounter (Signed)
Pt pharmacy requesting quantity increase for pt Bupropion 75 mg BID. Per pharmacy, they receieved 30 tablets and SIG stated BID. Pharmacy number is [phone].

## 2014-09-29 NOTE — Telephone Encounter (Signed)
Spoke with Sharyn Lull at pt pharmacy and informed her that provider wanted pt to do QD instead of BID. Per Sharyn Lull, they will get pt medication ready for her.

## 2014-09-29 NOTE — Telephone Encounter (Signed)
Informed pt that request from her pharmacy was sent to office to increase the quantity limit. Informed pt that message was sent to Dr. Harrington Challenger right before lunch and office go to lunch at 12;00pm to 1:00pm. Informed pt that as soon as Dr. Harrington Challenger gives the ok then her medication will be resent to her pharmacy. Pt agreed and showed understanding.
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
## 2014-09-29 NOTE — Telephone Encounter (Signed)
patient went to pharmacy to pick up new medication for Depression, but the pharmacy did not receive it.   She said if she is not taking the medicine she is not coming back in six weeks.

## 2014-11-07 ENCOUNTER — Other Ambulatory Visit (HOSPITAL_COMMUNITY)
Admission: RE | Admit: 2014-11-07 | Discharge: 2014-11-07 | Disposition: A | Discharge status: Home / Self Care | Payer: Commercial Managed Care - HMO | Source: Ambulatory Visit | Attending: Family Medicine | Admitting: Family Medicine

## 2014-11-07 ENCOUNTER — Encounter: Payer: Self-pay | Admitting: Family Medicine

## 2014-11-07 ENCOUNTER — Ambulatory Visit (INDEPENDENT_AMBULATORY_CARE_PROVIDER_SITE_OTHER): Payer: Commercial Managed Care - HMO | Admitting: Family Medicine

## 2014-11-07 VITALS — BP 116/64 | HR 84 | Resp 18 | Ht 63.0 in | Wt 193.1 lb

## 2014-11-07 DIAGNOSIS — Z1159 Encounter for screening for other viral diseases: Secondary | ICD-10-CM

## 2014-11-07 DIAGNOSIS — Z1151 Encounter for screening for human papillomavirus (HPV): Secondary | ICD-10-CM | POA: Diagnosis not present

## 2014-11-07 DIAGNOSIS — Z1211 Encounter for screening for malignant neoplasm of colon: Secondary | ICD-10-CM

## 2014-11-07 DIAGNOSIS — R7301 Impaired fasting glucose: Secondary | ICD-10-CM

## 2014-11-07 DIAGNOSIS — I10 Essential (primary) hypertension: Secondary | ICD-10-CM | POA: Diagnosis not present

## 2014-11-07 DIAGNOSIS — E785 Hyperlipidemia, unspecified: Secondary | ICD-10-CM

## 2014-11-07 DIAGNOSIS — Z0000 Encounter for general adult medical examination without abnormal findings: Secondary | ICD-10-CM | POA: Insufficient documentation

## 2014-11-07 DIAGNOSIS — R87810 Cervical high risk human papillomavirus (HPV) DNA test positive: Secondary | ICD-10-CM | POA: Insufficient documentation

## 2014-11-07 DIAGNOSIS — N3001 Acute cystitis with hematuria: Secondary | ICD-10-CM

## 2014-11-07 DIAGNOSIS — Z124 Encounter for screening for malignant neoplasm of cervix: Secondary | ICD-10-CM | POA: Diagnosis not present

## 2014-11-07 DIAGNOSIS — E038 Other specified hypothyroidism: Secondary | ICD-10-CM

## 2014-11-07 LAB — POC HEMOCCULT BLD/STL (OFFICE/1-CARD/DIAGNOSTIC): FECAL OCCULT BLD: NEGATIVE

## 2014-11-07 NOTE — Assessment & Plan Note (Signed)

## 2014-11-07 NOTE — Patient Instructions (Signed)
F/u in 5 month, call if you need me before  Please work on entirely [place] smoking   Please work on good  health habits so that your health will improve. 1. Commitment to daily physical activity for 30 to 60  minutes, if you are able to do this.  2. Commitment to wise food choices. Aim for half of your  food intake to be vegetable and fruit, one quarter starchy foods, and one quarter protein. Try to eat on a regular schedule  3 meals per day, snacking between meals should be limited to vegetables or fruits or small portions of nuts. 64 ounces of water per day is generally recommended, unless you have specific health conditions, like heart failure or kidney failure where you will need to limit fluid intake.  3. Commitment to sufficient and a  good quality of physical and mental rest daily, generally between 6 to 8 hours per day.  WITH PERSISTANCE AND PERSEVERANCE, THE IMPOSSIBLE , BECOMES THE NORM!  Fasting lipid, cmp and EGFr, hBA1C, TSH Hep and HIv asasp  Thanks for choosing [facility], we consider it a privelige to serve you.

## 2014-11-08 LAB — CYTOLOGY - PAP

## 2014-11-08 NOTE — Progress Notes (Signed)
HPI: Patient is in for annual physical exam. C/o urinary frequency and mild dysuria in past 3 days. Denies fever, chills , flank pain , or visible blood in urine Recent labs, if available are reviewed. Immunization is reviewed , and  updated if needed.   ROS: See HPI     PE: BP 116/64 mmHg  Pulse 84  Resp 18  Ht 5\' 3"  (1.6 m)  Wt 193 lb 1.9 oz (87.599 kg)  BMI 34.22 kg/m2  SpO2 100%   Pleasant well nourished female, alert and oriented x 3, in no cardio-pulmonary distress. Afebrile. HEENT No facial trauma or asymetry. Sinuses non tender.  Extra occullar muscles intact, pupils equally reactive to light. External ears normal, tympanic membranes clear. Oropharynx moist, no exudate, fair dentition. Neck: supple, no adenopathy,JVD or thyromegaly.No bruits.  Chest: Clear to ascultation bilaterally.No crackles or wheezes. Non tender to palpation  Breast: No asymetry,no masses or lumps. No tenderness. No nipple discharge or inversion. No axillary or supraclavicular adenopathy  Cardiovascular system; Heart sounds normal,  S1 and  S2 ,no S3.  No murmur, or thrill. Apical beat not displaced Peripheral pulses normal.  Abdomen: Soft, non tender, no organomegaly or masses. No bruits. Bowel sounds normal. No guarding, tenderness or rebound.  Rectal:  Normal sphincter tone. No mass.No rectal masses.  Guaiac negative stool.  GU: External genitalia normal female genitalia , female distribution of hair. No lesions. Urethral meatus normal in size, no  Prolapse, no lesions visibly  Present. Bladder non tender. Vagina pink and moist , with no visible lesions ,physiologic discharge present . Adequate pelvic support no  cystocele or rectocele noted  Uterus absent, no adnexal masses, no  adnexal tenderness.   Musculoskeletal exam: Full ROM of spine, hips , shoulders and knees. No deformity ,swelling or crepitus noted. No muscle wasting or atrophy.   Neurologic: Cranial  nerves 2 to 12 intact. Power, tone ,sensation and reflexes normal throughout. No disturbance in gait. No tremor.  Skin: Intact, no ulceration, erythema , scaling or rash noted. Pigmentation normal throughout  Psych; Normal mood and affect. Judgement and concentration normal  A/P: Annual physical exam Annual exam as documented. Counseling done  re healthy lifestyle involving commitment to 150 minutes exercise per week, heart healthy diet, and attaining healthy weight.The importance of adequate sleep also discussed. Regular seat belt use and home safety, is also discussed. Changes in health habits are decided on by the patient with goals and time frames  set for achieving them. Immunization and cancer screening needs are specifically addressed at this visit.   Acute cystitis with hematuria Symptomatic with mildly abnormal UA, will await on c/s

## 2014-11-10 ENCOUNTER — Encounter (HOSPITAL_COMMUNITY): Payer: Self-pay | Admitting: Psychiatry

## 2014-11-10 ENCOUNTER — Ambulatory Visit (INDEPENDENT_AMBULATORY_CARE_PROVIDER_SITE_OTHER): Payer: Commercial Managed Care - HMO | Admitting: Psychiatry

## 2014-11-10 VITALS — BP 110/78 | Ht 63.0 in | Wt 191.0 lb

## 2014-11-10 DIAGNOSIS — F3162 Bipolar disorder, current episode mixed, moderate: Secondary | ICD-10-CM

## 2014-11-10 DIAGNOSIS — F319 Bipolar disorder, unspecified: Secondary | ICD-10-CM | POA: Diagnosis not present

## 2014-11-10 DIAGNOSIS — F1021 Alcohol dependence, in remission: Secondary | ICD-10-CM | POA: Diagnosis not present

## 2014-11-10 MED ORDER — OLANZAPINE 5 MG PO TABS: 5.0000 mg | ORAL_TABLET | Freq: Every day | ORAL | Status: DC

## 2014-11-10 MED ORDER — LAMOTRIGINE 200 MG PO TABS: 200.0000 mg | ORAL_TABLET | Freq: Every day | ORAL | Status: DC

## 2014-11-10 MED ORDER — TRAZODONE HCL 100 MG PO TABS: 100.0000 mg | ORAL_TABLET | Freq: Every day | ORAL | Status: DC

## 2014-11-10 MED ORDER — BUPROPION HCL 75 MG PO TABS: 75.0000 mg | ORAL_TABLET | Freq: Every day | ORAL | Status: DC
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
## 2014-11-24 ENCOUNTER — Other Ambulatory Visit: Payer: Self-pay | Admitting: Family Medicine

## 2014-11-24 DIAGNOSIS — R87811 Vaginal high risk human papillomavirus (HPV) DNA test positive: Secondary | ICD-10-CM

## 2014-11-24 DIAGNOSIS — C579 Malignant neoplasm of female genital organ, unspecified: Secondary | ICD-10-CM

## 2014-11-28 DIAGNOSIS — N3001 Acute cystitis with hematuria: Secondary | ICD-10-CM | POA: Insufficient documentation

## 2014-11-28 NOTE — Assessment & Plan Note (Signed)
Symptomatic with mildly abnormal UA, will await on c/s

## 2014-11-29 DIAGNOSIS — I10 Essential (primary) hypertension: Secondary | ICD-10-CM | POA: Diagnosis not present

## 2014-11-29 DIAGNOSIS — E785 Hyperlipidemia, unspecified: Secondary | ICD-10-CM | POA: Diagnosis not present

## 2014-11-29 DIAGNOSIS — R7301 Impaired fasting glucose: Secondary | ICD-10-CM | POA: Diagnosis not present

## 2014-11-29 DIAGNOSIS — E038 Other specified hypothyroidism: Secondary | ICD-10-CM | POA: Diagnosis not present

## 2014-11-29 DIAGNOSIS — Z1159 Encounter for screening for other viral diseases: Secondary | ICD-10-CM | POA: Diagnosis not present

## 2014-11-30 LAB — LIPID PANEL
Cholesterol: 164 mg/dL (ref 125–200)
HDL: 66 mg/dL (ref >=46)
LDL CALC: 82 mg/dL (ref <130)
Total CHOL/HDL Ratio: 2.5 Ratio (ref <=5.0)
Triglycerides: 82 mg/dL (ref <150)
VLDL: 16 mg/dL (ref <30)

## 2014-11-30 LAB — HIV ANTIBODY (ROUTINE TESTING W REFLEX): HIV: NONREACTIVE

## 2014-11-30 LAB — COMPLETE METABOLIC PANEL WITH GFR
ALT: 11 U/L (ref 6–29)
AST: 17 U/L (ref 10–35)
Albumin: 4.3 g/dL (ref 3.6–5.1)
Alkaline Phosphatase: 66 U/L (ref 33–130)
BUN: 11 mg/dL (ref 7–25)
CHLORIDE: 99 mmol/L (ref 98–110)
CO2: 23 mmol/L (ref 20–31)
Calcium: 9.8 mg/dL (ref 8.6–10.4)
Creat: 1.02 mg/dL (ref 0.50–1.05)
GFR, EST AFRICAN AMERICAN: 70 mL/min (ref >=60)
GFR, EST NON AFRICAN AMERICAN: 61 mL/min (ref >=60)
GLUCOSE: 103 mg/dL — AB (ref 65–99)
POTASSIUM: 3.4 mmol/L — AB (ref 3.5–5.3)
SODIUM: 141 mmol/L (ref 135–146)
TOTAL PROTEIN: 7.6 g/dL (ref 6.1–8.1)
Total Bilirubin: 0.3 mg/dL (ref 0.2–1.2)

## 2014-11-30 LAB — HEPATITIS C ANTIBODY: HCV AB: NEGATIVE

## 2014-11-30 LAB — HEMOGLOBIN A1C
Hgb A1c MFr Bld: 5.9 % — ABNORMAL HIGH (ref <5.7)
MEAN PLASMA GLUCOSE: 123 mg/dL — AB (ref <117)

## 2014-11-30 LAB — TSH: TSH: 2.354 u[IU]/mL (ref 0.350–4.500)

## 2014-12-09 ENCOUNTER — Ambulatory Visit (INDEPENDENT_AMBULATORY_CARE_PROVIDER_SITE_OTHER): Payer: Medicare HMO | Admitting: Obstetrics & Gynecology

## 2014-12-09 ENCOUNTER — Encounter: Payer: Self-pay | Admitting: Obstetrics & Gynecology

## 2014-12-09 VITALS — BP 120/70 | Ht 63.0 in

## 2014-12-09 DIAGNOSIS — R87811 Vaginal high risk human papillomavirus (HPV) DNA test positive: Secondary | ICD-10-CM | POA: Diagnosis not present

## 2014-12-09 NOTE — Progress Notes (Signed)
Chief Complaint  Patient presents with  . Referral    Pt sent for a positive HPV with normal pap smear. Pt has hx of  GYN cancer that required a hysterectomy.     Blood pressure 120/70, height 5\' 3"  (1.6 m).  59 y.o. No obstetric history on file. No LMP recorded. Patient has had a hysterectomy. The current method of family planning is status post hysterectomy.  Subjective +HPV on her Pap,  cytology negative  History of previous ?gyn cnacer, cervical  Objective   Pertinent ROS   Labs or studies Pap is reviewed    Impression Diagnoses this Encounter::   ICD-9-CM ICD-10-CM   1. Vaginal high risk HPV DNA test positive 795.15 R87.811     Established relevant diagnosis(es): History of gyn issues in past with hsyt  Plan/Recommendations: No orders of the defined types were placed in this encounter.    Labs or Scans Ordered: No orders of the defined types were placed in this encounter.   With negative cytology no indication at this point to do colpo, if cytology becomes dysplastic will revisit, poor screen for vaginal cancer Pap in 1 year  Follow up Return if symptoms worsen or fail to improve.      Face to face time:  10 minutes  Greater than 50% of the visit time was spent in counseling and coordination of care with the patient.  The summary and outline of the counseling and care coordination is summarized in the note above.   All questions were answered.

## 2014-12-26 ENCOUNTER — Other Ambulatory Visit: Payer: Self-pay | Admitting: Family Medicine

## 2015-01-25 ENCOUNTER — Other Ambulatory Visit: Payer: Self-pay | Admitting: Family Medicine

## 2015-01-30 ENCOUNTER — Ambulatory Visit (INDEPENDENT_AMBULATORY_CARE_PROVIDER_SITE_OTHER): Payer: Medicare HMO | Admitting: Psychiatry

## 2015-01-30 ENCOUNTER — Encounter (HOSPITAL_COMMUNITY): Payer: Self-pay | Admitting: Psychiatry

## 2015-01-30 VITALS — BP 119/73 | HR 82 | Ht 63.0 in | Wt 190.8 lb

## 2015-01-30 DIAGNOSIS — F319 Bipolar disorder, unspecified: Secondary | ICD-10-CM

## 2015-01-30 DIAGNOSIS — F1021 Alcohol dependence, in remission: Secondary | ICD-10-CM | POA: Diagnosis not present

## 2015-01-30 DIAGNOSIS — F3162 Bipolar disorder, current episode mixed, moderate: Secondary | ICD-10-CM

## 2015-01-30 MED ORDER — OLANZAPINE 5 MG PO TABS: 5.0000 mg | ORAL_TABLET | Freq: Every day | ORAL | Status: DC

## 2015-01-30 MED ORDER — BUPROPION HCL 75 MG PO TABS: 75.0000 mg | ORAL_TABLET | Freq: Every day | ORAL | Status: DC

## 2015-01-30 MED ORDER — TRAZODONE HCL 100 MG PO TABS: 100.0000 mg | ORAL_TABLET | Freq: Every day | ORAL | Status: DC

## 2015-01-30 MED ORDER — LAMOTRIGINE 200 MG PO TABS: 200.0000 mg | ORAL_TABLET | Freq: Every day | ORAL | Status: DC
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
## 2015-03-09 ENCOUNTER — Other Ambulatory Visit: Payer: Self-pay

## 2015-03-09 ENCOUNTER — Telehealth: Payer: Self-pay | Admitting: Family Medicine

## 2015-03-09 ENCOUNTER — Other Ambulatory Visit: Payer: Self-pay | Admitting: Family Medicine

## 2015-03-09 MED ORDER — BUDESONIDE-FORMOTEROL FUMARATE 160-4.5 MCG/ACT IN AERO: INHALATION_SPRAY | RESPIRATORY_TRACT | Status: DC

## 2015-03-09 NOTE — Telephone Encounter (Signed)
Med refilled.

## 2015-03-09 NOTE — Telephone Encounter (Signed)
Patient is calling requesting a refill on SYMBICORT 160-4.5 MCG/ACT inhaler

## 2015-03-29 ENCOUNTER — Encounter: Payer: Self-pay | Admitting: Family Medicine

## 2015-03-29 ENCOUNTER — Ambulatory Visit (INDEPENDENT_AMBULATORY_CARE_PROVIDER_SITE_OTHER): Payer: Medicare HMO | Admitting: Family Medicine

## 2015-03-29 VITALS — BP 110/70 | HR 92 | Resp 16 | Ht 63.0 in | Wt 190.0 lb

## 2015-03-29 DIAGNOSIS — E038 Other specified hypothyroidism: Secondary | ICD-10-CM | POA: Diagnosis not present

## 2015-03-29 DIAGNOSIS — K120 Recurrent oral aphthae: Secondary | ICD-10-CM

## 2015-03-29 DIAGNOSIS — Z87891 Personal history of nicotine dependence: Secondary | ICD-10-CM

## 2015-03-29 DIAGNOSIS — E8881 Metabolic syndrome: Secondary | ICD-10-CM | POA: Diagnosis not present

## 2015-03-29 DIAGNOSIS — F418 Other specified anxiety disorders: Secondary | ICD-10-CM

## 2015-03-29 DIAGNOSIS — I10 Essential (primary) hypertension: Secondary | ICD-10-CM | POA: Diagnosis not present

## 2015-03-29 DIAGNOSIS — E785 Hyperlipidemia, unspecified: Secondary | ICD-10-CM | POA: Diagnosis not present

## 2015-03-29 DIAGNOSIS — E559 Vitamin D deficiency, unspecified: Secondary | ICD-10-CM

## 2015-03-29 DIAGNOSIS — F411 Generalized anxiety disorder: Secondary | ICD-10-CM

## 2015-03-29 NOTE — Progress Notes (Signed)
   Subjective:    Patient ID: Rebecca Rodgers, female    DOB: 07/14/1955, 60 y.o.   MRN: [id number]  HPI    Rebecca Rodgers     MRN: [id number]      DOB: 12/26/1955   HPI Rebecca Rodgers is here for follow up and re-evaluation of chronic medical conditions, medication management and review of any available recent lab and radiology data.  Preventive health is updated, specifically  Cancer screening and Immunization.   Questions or concerns regarding consultations or procedures which the PT has had in the interim are  addressed. The PT denies any adverse reactions to current medications since the last visit.  5 day h/o painful sore on tongue Just recovering from an episode of depressionm   ROS Denies recent fever or chills. Denies sinus pressure, nasal congestion, ear pain or sore throat. Denies chest congestion, productive cough or wheezing. Denies chest pains, palpitations and leg swelling Denies abdominal pain, nausea, vomiting,diarrhea or constipation.   Denies dysuria, frequency, hesitancy or incontinence. Denies uncontrolled  joint pain, swelling and limitation in mobility. Denies headaches, seizures, numbness, or tingling. Denies uncontrolled depressiDenies skin break down or rash.   PE  BP 110/70 mmHg  Pulse 92  Resp 16  Ht 5\' 3"  (1.6 m)  Wt 190 lb (86.183 kg)  BMI 33.67 kg/m2  SpO2 98%  Patient alert and oriented and in no cardiopulmonary distress.  HEENT: No facial asymmetry, EOMI,   oropharynx pink and moist.  Neck supple no JVD, no mass. TM clear, no sinus tenderness. aphthous ulcer on left lateral tongue Chest: Clear to auscultation bilaterally.Decreased though adequate air entry   CVS: S1, S2 no murmurs, no S3.Regular rate.  ABD: Soft non tender.   Ext: No edema  MS: Adequate ROM spine, shoulders, hips and knees.  Skin: Intact, no ulcerations or rash noted.  Psych: Good eye contact, normal affect. Memory intact not anxious or depressed appearing.  CNS: CN  2-12 intact, power,  normal throughout.no focal deficits noted.   Assessment & Plan   Essential hypertension Controlled, no change in medication DASH diet and commitment to daily physical activity for a minimum of 30 minutes discussed and encouraged, as a part of hypertension management. The importance of attaining a healthy weight is also discussed.  BP/Weight 03/29/2015 12/09/2014 11/07/2014 07/11/2014 03/10/2014 10/26/2013 [id number]  Systolic BP [id number] 123456 [id number] 123456 123456 [id number]  Diastolic BP 70 70 64 78 82 78 58  Wt. (Lbs) 190 - 193.12 192 191 203 204.3  BMI 33.67 - 34.22 34.02 33.84 35.97 36.2  Some encounter information is confidential and restricted. Go to Review Flowsheets activity to see all data.        Hypothyroidism Controlled, no change in medication   Anxiety state Controlled and managed by psychiaty  Depression with anxiety Reports deterioration in past approx 4 weeks, as wearing only pyjamas, recently improving, not suicidal or homicidal  Aphthous ulcer of mouth Salt water gargles and use of bonjelo      Review of Systems     Objective:   Physical Exam        Assessment & Plan:

## 2015-03-29 NOTE — Patient Instructions (Addendum)
Annual wellness in 2.5  [place] call if you need me sooneer  Fasting lipid, cmp and EGFr, hBA1C, TSH, vit D, CBC  Please work on good  health habits so that your health will improve. 1. Commitment to daily physical activity for 30 to 60  minutes, if you are able to do this.  2. Commitment to wise food choices. Aim for half of your  food intake to be vegetable and fruit, one quarter starchy foods, and one quarter protein. Try to eat on a regular schedule  3 meals per day, snacking between meals should be limited to vegetables or fruits or small portions of nuts. 64 ounces of water per day is generally recommended, unless you have specific health conditions, like heart failure or kidney failure where you will need to limit fluid intake.  3. Commitment to sufficient and a  good quality of physical and mental rest daily, generally between 6 to 8 hours per day.  WITH PERSISTANCE AND PERSEVERANCE, THE IMPOSSIBLE , BECOMES THE NORM!  Use tylenol twice daily to sore on tongue, and wash mouth twice daily with salt water  Ypu are referred for 01/21 or aftermammogram  No pyjamas outside of the house

## 2015-04-07 ENCOUNTER — Ambulatory Visit (HOSPITAL_COMMUNITY): Admission: RE | Admit: 2015-04-07 | Payer: Medicare HMO | Source: Ambulatory Visit

## 2015-04-07 ENCOUNTER — Telehealth: Payer: Self-pay | Admitting: Family Medicine

## 2015-04-07 NOTE — Telephone Encounter (Signed)
Rebecca Rodgers is asking for a motion sickness medication can be refilled , please advise?

## 2015-04-08 DIAGNOSIS — K120 Recurrent oral aphthae: Secondary | ICD-10-CM | POA: Insufficient documentation

## 2015-04-08 NOTE — Assessment & Plan Note (Signed)
Reports deterioration in past approx 4 weeks, as wearing only pyjamas, recently improving, not suicidal or homicidal

## 2015-04-08 NOTE — Assessment & Plan Note (Signed)
Controlled and managed by psychiaty

## 2015-04-08 NOTE — Assessment & Plan Note (Signed)
Controlled, no change in medication  

## 2015-04-08 NOTE — Assessment & Plan Note (Signed)
Controlled, no change in medication DASH diet and commitment to daily physical activity for a minimum of 30 minutes discussed and encouraged, as a part of hypertension management. The importance of attaining a healthy weight is also discussed.  BP/Weight 03/29/2015 12/09/2014 11/07/2014 07/11/2014 03/10/2014 10/26/2013 [id number]  Systolic BP [id number] 123456 [id number] 123456 123456 [id number]  Diastolic BP 70 70 64 78 82 78 58  Wt. (Lbs) 190 - 193.12 192 191 203 204.3  BMI 33.67 - 34.22 34.02 33.84 35.97 36.2  Some encounter information is confidential and restricted. Go to Review Flowsheets activity to see all data.

## 2015-04-08 NOTE — Assessment & Plan Note (Signed)
Salt water gargles and use of bonjelo

## 2015-04-10 NOTE — Telephone Encounter (Signed)
Not seeing this on her med list. Called and left message for her to call her pharmacy and have them send over a refill request for this

## 2015-04-14 ENCOUNTER — Ambulatory Visit (HOSPITAL_COMMUNITY): Admission: RE | Admit: 2015-04-14 | Payer: Medicare HMO | Source: Ambulatory Visit

## 2015-04-27 ENCOUNTER — Other Ambulatory Visit: Payer: Self-pay | Admitting: Family Medicine

## 2015-05-01 ENCOUNTER — Telehealth (HOSPITAL_COMMUNITY): Payer: Self-pay | Admitting: *Deleted

## 2015-05-01 ENCOUNTER — Other Ambulatory Visit: Payer: Self-pay | Admitting: Family Medicine

## 2015-05-01 ENCOUNTER — Encounter (HOSPITAL_COMMUNITY): Payer: Self-pay | Admitting: Psychiatry

## 2015-05-01 ENCOUNTER — Ambulatory Visit (INDEPENDENT_AMBULATORY_CARE_PROVIDER_SITE_OTHER): Payer: Medicare HMO | Admitting: Psychiatry

## 2015-05-01 VITALS — BP 136/81 | HR 90 | Ht 63.0 in | Wt 195.8 lb

## 2015-05-01 DIAGNOSIS — F1021 Alcohol dependence, in remission: Secondary | ICD-10-CM | POA: Diagnosis not present

## 2015-05-01 DIAGNOSIS — F3162 Bipolar disorder, current episode mixed, moderate: Secondary | ICD-10-CM

## 2015-05-01 DIAGNOSIS — F319 Bipolar disorder, unspecified: Secondary | ICD-10-CM | POA: Diagnosis not present

## 2015-05-01 MED ORDER — LAMOTRIGINE 200 MG PO TABS: 200.0000 mg | ORAL_TABLET | Freq: Every day | ORAL | Status: DC

## 2015-05-01 MED ORDER — BUPROPION HCL ER (XL) 150 MG PO TB24: 150.0000 mg | ORAL_TABLET | ORAL | Status: DC

## 2015-05-01 MED ORDER — TRAZODONE HCL 100 MG PO TABS: 100.0000 mg | ORAL_TABLET | Freq: Every day | ORAL | Status: DC

## 2015-05-01 MED ORDER — OLANZAPINE 5 MG PO TABS: 5.0000 mg | ORAL_TABLET | Freq: Every day | ORAL | Status: DC

## 2015-05-01 NOTE — Telephone Encounter (Signed)
Called pt and she stated she wanted me to add on Amlodipine and Metformin to her list. Per pt, she told me to take it off her list but when she got home she found it in her medicine cabinet.
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
## 2015-05-01 NOTE — Telephone Encounter (Signed)
Voice message from patient, would like phone call from [facility] regarding two of her medications.

## 2015-05-01 NOTE — Telephone Encounter (Signed)
Pt called stating when she came into office she informed the RMA that she was not taking two medications but when she came home, she found them. Per pt she would like these medications to be added to her medication list. The two medications are Amlodipine and Metformin.

## 2015-05-02 ENCOUNTER — Telehealth (HOSPITAL_COMMUNITY): Payer: Self-pay | Admitting: *Deleted

## 2015-05-02 NOTE — Telephone Encounter (Signed)
Pt called stating she called her insurance and they told her that Dr. Leonard Downing is in network with her plan. Per pt, can't office look in her chart and find out. Informed pt that top management informed staff to inform all pts that have [employer] that office is not in network and to call plan. So if they are then we don't know. Informed pt that unfortunately, staff is not able to tell if she is in network or not. Informed pt that we are just informing everyone like management told us to. Informed pt to call the [facility] office to see if they could help her or give her further information that I'm giving her.

## 2015-05-03 ENCOUNTER — Telehealth (HOSPITAL_COMMUNITY): Payer: Self-pay | Admitting: *Deleted

## 2015-05-03 NOTE — Telephone Encounter (Signed)
ok 

## 2015-05-03 NOTE — Telephone Encounter (Signed)
phone call from patient who canceled her appointment in March due to [facility] out of network.    She found a new provider.

## 2015-05-11 ENCOUNTER — Other Ambulatory Visit: Payer: Self-pay

## 2015-05-11 DIAGNOSIS — F418 Other specified anxiety disorders: Secondary | ICD-10-CM

## 2015-05-15 ENCOUNTER — Telehealth: Payer: Self-pay | Admitting: Family Medicine

## 2015-05-15 DIAGNOSIS — F418 Other specified anxiety disorders: Secondary | ICD-10-CM

## 2015-05-15 NOTE — Telephone Encounter (Signed)
Referral entered  

## 2015-05-15 NOTE — Telephone Encounter (Signed)
Needs a referral from PCP to see Dr. Harrington Challenger now beings that  Ms. Easts' Insurance is out of network, please advise?

## 2015-05-26 ENCOUNTER — Other Ambulatory Visit: Payer: Self-pay | Admitting: Family Medicine

## 2015-05-29 ENCOUNTER — Ambulatory Visit (HOSPITAL_COMMUNITY): Payer: Self-pay | Admitting: Psychiatry

## 2015-06-02 ENCOUNTER — Other Ambulatory Visit: Payer: Self-pay | Admitting: Family Medicine

## 2015-06-02 DIAGNOSIS — F3175 Bipolar disorder, in partial remission, most recent episode depressed: Secondary | ICD-10-CM | POA: Diagnosis not present

## 2015-06-05 ENCOUNTER — Telehealth: Payer: Self-pay | Admitting: Family Medicine

## 2015-06-05 MED ORDER — MECLIZINE HCL 25 MG PO TABS: 25.0000 mg | ORAL_TABLET | Freq: Three times a day (TID) | ORAL | Status: DC | PRN

## 2015-06-05 NOTE — Telephone Encounter (Signed)
antivert sent pls let her know

## 2015-06-05 NOTE — Telephone Encounter (Signed)
Med sent, called and left message on voicemail

## 2015-06-05 NOTE — Telephone Encounter (Signed)
Rebecca Rodgers is calling stating that she is needing something called in for Vertigo, she has been having symptoms for 2 to 3 days now, please advise?

## 2015-06-05 NOTE — Telephone Encounter (Signed)
Has been having symptoms of vertigo for the past few days, dizziness/room spinning and wants something called in for it. Please advise

## 2015-06-07 ENCOUNTER — Other Ambulatory Visit: Payer: Self-pay

## 2015-06-07 DIAGNOSIS — Z1231 Encounter for screening mammogram for malignant neoplasm of breast: Secondary | ICD-10-CM

## 2015-06-08 ENCOUNTER — Ambulatory Visit: Payer: Self-pay

## 2015-06-09 ENCOUNTER — Other Ambulatory Visit: Payer: Self-pay | Admitting: Family Medicine

## 2015-06-14 ENCOUNTER — Ambulatory Visit (HOSPITAL_COMMUNITY)
Admission: RE | Admit: 2015-06-14 | Discharge: 2015-06-14 | Disposition: A | Discharge status: Home / Self Care | Payer: Medicare HMO | Source: Ambulatory Visit | Attending: Family Medicine | Admitting: Family Medicine

## 2015-06-14 DIAGNOSIS — Z1231 Encounter for screening mammogram for malignant neoplasm of breast: Secondary | ICD-10-CM | POA: Diagnosis not present

## 2015-07-20 DIAGNOSIS — E559 Vitamin D deficiency, unspecified: Secondary | ICD-10-CM | POA: Diagnosis not present

## 2015-07-20 DIAGNOSIS — I10 Essential (primary) hypertension: Secondary | ICD-10-CM | POA: Diagnosis not present

## 2015-07-20 DIAGNOSIS — R7302 Impaired glucose tolerance (oral): Secondary | ICD-10-CM | POA: Diagnosis not present

## 2015-07-20 DIAGNOSIS — E8881 Metabolic syndrome: Secondary | ICD-10-CM | POA: Diagnosis not present

## 2015-07-20 DIAGNOSIS — E785 Hyperlipidemia, unspecified: Secondary | ICD-10-CM | POA: Diagnosis not present

## 2015-07-20 LAB — CBC WITH DIFFERENTIAL/PLATELET
BASOS ABS: 0 {cells}/uL (ref 0–200)
Basophils Relative: 0 %
EOS ABS: 158 {cells}/uL (ref 15–500)
Eosinophils Relative: 2 %
HCT: 41.5 % (ref 35.0–45.0)
HEMOGLOBIN: 13.7 g/dL (ref 11.7–15.5)
LYMPHS ABS: 2370 {cells}/uL (ref 850–3900)
Lymphocytes Relative: 30 %
MCH: 29.6 pg (ref 27.0–33.0)
MCHC: 33 g/dL (ref 32.0–36.0)
MCV: 89.6 fL (ref 80.0–100.0)
MPV: 10.4 fL (ref 7.5–12.5)
Monocytes Absolute: 474 cells/uL (ref 200–950)
Monocytes Relative: 6 %
NEUTROS ABS: 4898 {cells}/uL (ref 1500–7800)
Neutrophils Relative %: 62 %
Platelets: 391 10*3/uL (ref 140–400)
RBC: 4.63 MIL/uL (ref 3.80–5.10)
RDW: 15.2 % — ABNORMAL HIGH (ref 11.0–15.0)
WBC: 7.9 10*3/uL (ref 3.8–10.8)

## 2015-07-20 LAB — HEMOGLOBIN A1C
HEMOGLOBIN A1C: 5.6 % (ref <5.7)
Mean Plasma Glucose: 114 mg/dL

## 2015-07-21 LAB — LIPID PANEL
CHOLESTEROL: 169 mg/dL (ref 125–200)
HDL: 63 mg/dL (ref >=46)
LDL Cholesterol: 91 mg/dL (ref <130)
Total CHOL/HDL Ratio: 2.7 Ratio (ref <=5.0)
Triglycerides: 76 mg/dL (ref <150)
VLDL: 15 mg/dL (ref <30)

## 2015-07-21 LAB — COMPLETE METABOLIC PANEL WITH GFR
ALBUMIN: 4.3 g/dL (ref 3.6–5.1)
ALK PHOS: 61 U/L (ref 33–130)
ALT: 14 U/L (ref 6–29)
AST: 16 U/L (ref 10–35)
BILIRUBIN TOTAL: 0.3 mg/dL (ref 0.2–1.2)
BUN: 13 mg/dL (ref 7–25)
CALCIUM: 9.8 mg/dL (ref 8.6–10.4)
CO2: 22 mmol/L (ref 20–31)
Chloride: 100 mmol/L (ref 98–110)
Creat: 1.02 mg/dL (ref 0.50–1.05)
GFR, EST NON AFRICAN AMERICAN: 60 mL/min (ref >=60)
GFR, Est African American: 70 mL/min (ref >=60)
GLUCOSE: 97 mg/dL (ref 65–99)
Potassium: 4.4 mmol/L (ref 3.5–5.3)
SODIUM: 138 mmol/L (ref 135–146)
TOTAL PROTEIN: 7.9 g/dL (ref 6.1–8.1)

## 2015-07-21 LAB — VITAMIN D 25 HYDROXY (VIT D DEFICIENCY, FRACTURES): Vit D, 25-Hydroxy: 41 ng/mL (ref 30–100)

## 2015-07-21 LAB — TSH: TSH: 2.59 m[IU]/L

## 2015-07-25 ENCOUNTER — Other Ambulatory Visit: Payer: Self-pay | Admitting: Family Medicine

## 2015-08-15 ENCOUNTER — Ambulatory Visit (INDEPENDENT_AMBULATORY_CARE_PROVIDER_SITE_OTHER): Payer: Commercial Managed Care - HMO | Admitting: Family Medicine

## 2015-08-15 ENCOUNTER — Encounter: Payer: Self-pay | Admitting: Family Medicine

## 2015-08-15 VITALS — BP 112/80 | HR 94 | Resp 16 | Ht 63.0 in | Wt 187.0 lb

## 2015-08-15 DIAGNOSIS — Z0000 Encounter for general adult medical examination without abnormal findings: Secondary | ICD-10-CM

## 2015-08-15 DIAGNOSIS — Z1211 Encounter for screening for malignant neoplasm of colon: Secondary | ICD-10-CM

## 2015-08-15 MED ORDER — ROSUVASTATIN CALCIUM 40 MG PO TABS: ORAL_TABLET | ORAL | Status: DC

## 2015-08-15 MED ORDER — MECLIZINE HCL 25 MG PO TABS: 25.0000 mg | ORAL_TABLET | Freq: Three times a day (TID) | ORAL | Status: DC | PRN

## 2015-08-15 MED ORDER — LEVOTHYROXINE SODIUM 50 MCG PO TABS: ORAL_TABLET | ORAL | Status: DC

## 2015-08-15 MED ORDER — AMLODIPINE BESYLATE 10 MG PO TABS: ORAL_TABLET | ORAL | Status: DC

## 2015-08-15 NOTE — Progress Notes (Signed)
Subjective:    Patient ID: Rebecca Rodgers, female    DOB: [birth year], 60 y.o.   MRN: [id number]  HPI Preventive Screening-Counseling & Management   Patient present here today for a Medicare annual wellness visit.   Current Problems (verified)   Medications Prior to Visit Allergies (verified)   PAST HISTORY  Family History (verified)  Social History Lives with her girlfriend, no children, disabled   Risk Factors  Current exercise habits:  Exercises 5 days a week at the gym and walks a mile daily   Dietary issues discussed: heart healthy diet encouraged and limiting fried foods and carbs     Cardiac risk factors: metabolic syndrome   Depression Screen  (Note: if answer to either of the following is "Yes", a more complete depression screening is indicated)   Over the past two weeks, have you felt down, depressed or hopeless? No  Over the past two weeks, have you felt little interest or pleasure in doing things? No  Have you lost interest or pleasure in daily life? No  Do you often feel hopeless? No  Do you cry easily over simple problems? No   Activities of Daily Living  In your present state of health, do you have any difficulty performing the following activities?  Driving?: No Managing money?: No Feeding yourself?:No Getting from bed to chair?:No Climbing a flight of stairs?: avoids stairs on stair master, but not regular steps Preparing food and eating?:No Bathing or showering?:No Getting dressed?:No Getting to the toilet?:No Using the toilet?:No Moving around from place to place?: No  Fall Risk Assessment In the past year have you fallen or had a near fall?:No Are you currently taking any medications that make you dizzy?:No   Hearing Difficulties: No Do you often ask people to speak up or repeat themselves?:yes  Do you experience ringing or noises in your ears?:No Do you have difficulty understanding soft or whispered voices?:yes, wears hearing aids    Cognitive Testing  Alert? Yes Normal Appearance?Yes  Oriented to person? Yes Place? Yes  Time? Yes  Displays appropriate judgment?Yes  Can read the correct time from a watch face? yes Are you having problems remembering things?No  Advanced Directives have been discussed with the patient?Yes, doesn't have a living will , full code   List the Names of Other Physician/Practitioners you currently use:  Dr Adelene Idler ([facility])   Indicate any recent Medical Services you may have received from other than [facility] providers in the past year (date may be approximate).   Assessment:    Annual Wellness Exam   Plan:     Medicare Attestation  I have personally reviewed:  The patient's medical and social history  Their use of alcohol, tobacco or illicit drugs  Their current medications and supplements  The patient's functional ability including ADLs,fall risks, home safety risks, cognitive, and hearing and visual impairment  Diet and physical activities  Evidence for depression or mood disorders  The patient's weight, height, BMI, and visual acuity have been recorded in the chart. I have made referrals, counseling, and provided education to the patient based on review of the above and I have provided the patient with a written personalized care plan for preventive services.     Review of Systems     Objective:   Physical Exam  BP 112/80 mmHg  Pulse 94  Resp 16  Ht 5\' 3"  (1.6 m)  Wt 187 lb (84.823 kg)  BMI 33.13 kg/m2  SpO2 98%  Assessment & Plan:  Medicare annual wellness visit, subsequent Annual exam as documented. Counseling done  re healthy lifestyle involving commitment to 150 minutes exercise per week, heart healthy diet, and attaining healthy weight.The importance of adequate sleep also discussed. Regular seat belt use and home safety, is also discussed. Changes in health habits are decided on by the patient with goals and time frames  set for achieving  them. Immunization and cancer screening needs are specifically addressed at this visit.

## 2015-08-15 NOTE — Patient Instructions (Signed)
Annual physical exam in 5 .5 months, call if you need me before  You are referred t for colonoscopy due in October  Please work on good  health habits so that your health will improve. 1. Commitment to daily physical activity for 30 to 60  minutes, if you are able to do this.  2. Commitment to wise food choices. Aim for half of your  food intake to be vegetable and fruit, one quarter starchy foods, and one quarter protein. Try to eat on a regular schedule  3 meals per day, snacking between meals should be limited to vegetables or fruits or small portions of nuts. 64 ounces of water per day is generally recommended, unless you have specific health conditions, like heart failure or kidney failure where you will need to limit fluid intake.  3. Commitment to sufficient and a  good quality of physical and mental rest daily, generally between 6 to 8 hours per day.  WITH PERSISTANCE AND PERSEVERANCE, THE IMPOSSIBLE , BECOMES THE NORM!   Advance Directive Advance directives are the legal documents that allow you to make choices about your health care and medical treatment if you cannot speak for yourself. Advance directives are a way for you to communicate your wishes to family, friends, and health care providers. The specified people can then convey your decisions about end-of-life care to avoid confusion if you should become unable to communicate. Ideally, the process of discussing and writing advance directives should happen over time rather than making decisions all at once. Advance directives can be modified as your situation changes, and you can change your mind at any time, even after you have signed the advance directives. Each state has its own laws regarding advance directives. You may want to check with your health care provider, attorney, or state representative about the law in your state. Below are some examples of advance directives. LIVING WILL A living will is a set of instructions  documenting your wishes about medical care when you cannot care for yourself. It is used if you become:  Terminally ill.  Incapacitated.  Unable to communicate.  Unable to make decisions. Items to consider in your living will include:  The use or non-use of life-sustaining equipment, such as dialysis machines and breathing machines (ventilators).  A do not resuscitate (DNR) order, which is the instruction not to use cardiopulmonary resuscitation (CPR) if breathing or heartbeat stops.  Tube feeding.  Withholding of food and fluids.  Comfort (palliative) care when the goal becomes comfort rather than a cure.  Organ and tissue donation. A living will does not give instructions about distribution of your money and property if you should pass away. It is advisable to seek the expert advice of a lawyer in drawing up a will regarding your possessions. Decisions about taxes, beneficiaries, and asset distribution will be legally binding. This process can relieve your family and friends of any burdens surrounding disputes or questions that may come up about the allocation of your assets. DO NOT RESUSCITATE (DNR) A do not resuscitate (DNR) order is a request to not have CPR in the event that your heart stops beating or you stop breathing. Unless given other instructions, a health care provider will try to help any patient whose heart has stopped or who has stopped breathing.  HEALTH CARE PROXY AND DURABLE POWER OF ATTORNEY FOR HEALTH CARE A health care proxy is a person (agent) appointed to make medical decisions for you if you cannot. Generally, people choose  someone they know well and trust to represent their preferences when they can no longer do so. You should be sure to ask this person for agreement to act as your agent. An agent may have to exercise judgment in the event of a medical decision for which your wishes are not known. The durable power of attorney for health care is the legal  document that names your health care proxy. Once written, it should be:  Signed.  Notarized.  Dated.  Copied.  Witnessed.  Incorporated into your medical record. You may also want to appoint someone to manage your financial affairs if you cannot. This is called a durable power of attorney for finances. It is a separate legal document from the durable power of attorney for health care. You may choose the same person or someone different from your health care proxy to act as your agent in financial matters.   This information is not intended to replace advice given to you by your health care provider. Make sure you discuss any questions you have with your health care provider.   Document Released: 06/18/2007 Document Revised: 03/16/2013 Document Reviewed: 07/29/2012 Elsevier Interactive Patient Education [employer].

## 2015-08-15 NOTE — Assessment & Plan Note (Signed)

## 2015-08-25 ENCOUNTER — Other Ambulatory Visit: Payer: Self-pay | Admitting: Family Medicine

## 2015-09-15 DIAGNOSIS — F3175 Bipolar disorder, in partial remission, most recent episode depressed: Secondary | ICD-10-CM | POA: Diagnosis not present

## 2015-09-25 ENCOUNTER — Other Ambulatory Visit: Payer: Self-pay | Admitting: Family Medicine

## 2015-11-24 ENCOUNTER — Other Ambulatory Visit: Payer: Self-pay | Admitting: Family Medicine

## 2015-12-25 ENCOUNTER — Other Ambulatory Visit: Payer: Self-pay | Admitting: Family Medicine

## 2015-12-27 DIAGNOSIS — Z0100 Encounter for examination of eyes and vision without abnormal findings: Secondary | ICD-10-CM | POA: Diagnosis not present

## 2015-12-27 DIAGNOSIS — H5200 Hypermetropia, unspecified eye: Secondary | ICD-10-CM | POA: Diagnosis not present

## 2016-01-03 ENCOUNTER — Encounter (INDEPENDENT_AMBULATORY_CARE_PROVIDER_SITE_OTHER): Payer: Self-pay | Admitting: *Deleted

## 2016-01-04 ENCOUNTER — Telehealth: Payer: Self-pay

## 2016-01-04 DIAGNOSIS — E785 Hyperlipidemia, unspecified: Secondary | ICD-10-CM | POA: Diagnosis not present

## 2016-01-04 DIAGNOSIS — E8881 Metabolic syndrome: Secondary | ICD-10-CM

## 2016-01-04 DIAGNOSIS — R7301 Impaired fasting glucose: Secondary | ICD-10-CM | POA: Diagnosis not present

## 2016-01-04 LAB — COMPLETE METABOLIC PANEL WITH GFR
ALBUMIN: 4.1 g/dL (ref 3.6–5.1)
ALK PHOS: 66 U/L (ref 33–130)
ALT: 14 U/L (ref 6–29)
AST: 19 U/L (ref 10–35)
BUN: 14 mg/dL (ref 7–25)
CALCIUM: 9.6 mg/dL (ref 8.6–10.4)
CO2: 25 mmol/L (ref 20–31)
Chloride: 104 mmol/L (ref 98–110)
Creat: 0.95 mg/dL (ref 0.50–1.05)
GFR, EST AFRICAN AMERICAN: 76 mL/min (ref >=60)
GFR, EST NON AFRICAN AMERICAN: 66 mL/min (ref >=60)
Glucose, Bld: 105 mg/dL — ABNORMAL HIGH (ref 65–99)
POTASSIUM: 4.2 mmol/L (ref 3.5–5.3)
Sodium: 139 mmol/L (ref 135–146)
Total Bilirubin: 0.3 mg/dL (ref 0.2–1.2)
Total Protein: 7.3 g/dL (ref 6.1–8.1)

## 2016-01-04 LAB — LIPID PANEL
Cholesterol: 190 mg/dL (ref 125–200)
HDL: 70 mg/dL (ref >=46)
LDL CALC: 100 mg/dL (ref <130)
TRIGLYCERIDES: 99 mg/dL (ref <150)
Total CHOL/HDL Ratio: 2.7 Ratio (ref <=5.0)
VLDL: 20 mg/dL (ref <30)

## 2016-01-04 NOTE — Telephone Encounter (Signed)
Labs re-ordered

## 2016-01-05 LAB — HEMOGLOBIN A1C
HEMOGLOBIN A1C: 5.7 % — AB (ref <5.7)
MEAN PLASMA GLUCOSE: 117 mg/dL

## 2016-01-25 ENCOUNTER — Other Ambulatory Visit: Payer: Self-pay | Admitting: Family Medicine

## 2016-02-20 ENCOUNTER — Encounter: Payer: Self-pay | Admitting: Family Medicine

## 2016-02-20 ENCOUNTER — Ambulatory Visit (INDEPENDENT_AMBULATORY_CARE_PROVIDER_SITE_OTHER): Payer: Commercial Managed Care - HMO | Admitting: Family Medicine

## 2016-02-20 VITALS — BP 114/74 | HR 96 | Resp 16 | Ht 63.0 in | Wt 200.0 lb

## 2016-02-20 DIAGNOSIS — Z0000 Encounter for general adult medical examination without abnormal findings: Secondary | ICD-10-CM

## 2016-02-20 DIAGNOSIS — E038 Other specified hypothyroidism: Secondary | ICD-10-CM

## 2016-02-20 DIAGNOSIS — Z1211 Encounter for screening for malignant neoplasm of colon: Secondary | ICD-10-CM

## 2016-02-20 DIAGNOSIS — E8881 Metabolic syndrome: Secondary | ICD-10-CM

## 2016-02-20 DIAGNOSIS — Z23 Encounter for immunization: Secondary | ICD-10-CM | POA: Diagnosis not present

## 2016-02-20 DIAGNOSIS — I10 Essential (primary) hypertension: Secondary | ICD-10-CM

## 2016-02-20 LAB — POC HEMOCCULT BLD/STL (OFFICE/1-CARD/DIAGNOSTIC): FECAL OCCULT BLD: NEGATIVE

## 2016-02-20 MED ORDER — IBUPROFEN 800 MG PO TABS: ORAL_TABLET | ORAL | 2 refills | Status: DC

## 2016-02-20 NOTE — Patient Instructions (Signed)
F/u in 4 month, call if you need me before  Flu vaccine today  CONGRATS on still not smoking!    Non fast TSH, Vit D, chem 7  And HBA1C in 4 month  Please work on good  health habits so that your health will improve. 1. Commitment to daily physical activity for 30 to 60  minutes, if you are able to do this.  2. Commitment to wise food choices. Aim for half of your  food intake to be vegetable and fruit, one quarter starchy foods, and one quarter protein. Try to eat on a regular schedule  3 meals per day, snacking between meals should be limited to vegetables or fruits or small portions of nuts. 64 ounces of water per day is generally recommended, unless you have specific health conditions, like heart failure or kidney failure where you will need to limit fluid intake.  3. Commitment to sufficient and a  good quality of physical and mental rest daily, generally between 6 to 8 hours per day.  WITH PERSISTANCE AND PERSEVERANCE, THE IMPOSSIBLE , BECOMES THE NORM!

## 2016-02-28 ENCOUNTER — Encounter: Payer: Self-pay | Admitting: Family Medicine

## 2016-02-28 DIAGNOSIS — Z23 Encounter for immunization: Secondary | ICD-10-CM | POA: Insufficient documentation

## 2016-02-28 NOTE — Assessment & Plan Note (Signed)
Annual exam as documented. Counseling done  re healthy lifestyle involving commitment to 150 minutes exercise per week, heart healthy diet, and attaining healthy weight.The importance of adequate sleep also discussed.  Immunization and cancer screening needs are specifically addressed at this visit.  

## 2016-02-28 NOTE — Assessment & Plan Note (Signed)
After obtaining informed consent, the vaccine is  administered by LPN.  

## 2016-02-28 NOTE — Progress Notes (Signed)
    Rebecca Rodgers     MRN: [id number]      DOB: 10-May-1955  HPI: Patient is in for annual physical exam. No other health concerns are expressed or addressed at the visit. Recent labs, if available are reviewed. Immunization is reviewed , and  updated if needed.   PE: Pleasant  female, alert and oriented x 3, in no cardio-pulmonary distress. Afebrile. HEENT No facial trauma or asymetry. Sinuses non tender.  Extra occullar muscles intact, pupils equally reactive to light. External ears normal, tympanic membranes clear. Oropharynx moist, no exudate. Neck: supple, no adenopathy,JVD or thyromegaly.No bruits.  Chest: Clear to ascultation bilaterally.No crackles or wheezes. Non tender to palpation  Breast: No asymetry,no masses or lumps. No tenderness. No nipple discharge or inversion. No axillary or supraclavicular adenopathy  Cardiovascular system; Heart sounds normal,  S1 and  S2 ,no S3.  No murmur, or thrill. Apical beat not displaced Peripheral pulses normal.  Abdomen: Soft, non tender, no organomegaly or masses. No bruits. Bowel sounds normal. No guarding, tenderness or rebound.  Rectal:  Normal sphincter tone. No rectal mass. Guaiac negative stool.  GU: External genitalia normal female genitalia , normal female distribution of hair. No lesions. Urethral meatus normal in size, no  Prolapse, no lesions visibly  Present. Bladder non tender. Vagina pink and moist , with no visible lesions , discharge present . Adequate pelvic support no  cystocele or rectocele noted Uterus absent, no adnexal masses, no  adnexal tenderness.   Musculoskeletal exam: Full ROM of spine, hips , shoulders and knees. No deformity ,swelling or crepitus noted. No muscle wasting or atrophy.   Neurologic: Cranial nerves 2 to 12 intact. Power, tone ,sensation and reflexes normal throughout. No disturbance in gait. No tremor.  Skin: Intact, no ulceration, erythema , scaling or rash  noted. Pigmentation normal throughout  Psych; Normal mood and affect. Judgement and concentration normal   Assessment & Plan:  Annual physical exam Annual exam as documented. Counseling done  re healthy lifestyle involving commitment to 150 minutes exercise per week, heart healthy diet, and attaining healthy weight.The importance of adequate sleep also discussed. . Immunization and cancer screening needs are specifically addressed at this visit.   Need for prophylactic vaccination and inoculation against influenza After obtaining informed consent, the vaccine is  administered by LPN.

## 2016-03-01 DIAGNOSIS — F3175 Bipolar disorder, in partial remission, most recent episode depressed: Secondary | ICD-10-CM | POA: Diagnosis not present

## 2016-03-03 DIAGNOSIS — M7989 Other specified soft tissue disorders: Secondary | ICD-10-CM | POA: Diagnosis not present

## 2016-03-03 DIAGNOSIS — M25572 Pain in left ankle and joints of left foot: Secondary | ICD-10-CM | POA: Diagnosis not present

## 2016-03-03 DIAGNOSIS — S93402A Sprain of unspecified ligament of left ankle, initial encounter: Secondary | ICD-10-CM | POA: Diagnosis not present

## 2016-03-04 ENCOUNTER — Telehealth: Payer: Self-pay

## 2016-03-04 DIAGNOSIS — S93402A Sprain of unspecified ligament of left ankle, initial encounter: Secondary | ICD-10-CM

## 2016-03-04 NOTE — Telephone Encounter (Signed)
Referral to ortho, [facility] is  Appropriate wi th documented fracture, pls refer I will sign, advise her care not to repeat fall!

## 2016-03-05 ENCOUNTER — Telehealth: Payer: Self-pay | Admitting: Family Medicine

## 2016-03-05 ENCOUNTER — Encounter: Payer: Self-pay | Admitting: Orthopedic Surgery

## 2016-03-05 NOTE — Telephone Encounter (Signed)
Referral entered.   Attempted to call patient.  Both #s are out of order.

## 2016-03-05 NOTE — Telephone Encounter (Signed)
P[ls let her know I have reviewed visit to [facility] ed, she has a SPRAIN of left ankle, not fracture documented as diagnosis, psl refer to ortho of her choice , I will sign

## 2016-03-05 NOTE — Addendum Note (Signed)
Addended by: Denman George B on: 03/05/2016 12:20 PM   Modules accepted: Orders

## 2016-03-05 NOTE — Telephone Encounter (Signed)
See previous telephone message  

## 2016-03-07 ENCOUNTER — Other Ambulatory Visit: Payer: Self-pay | Admitting: Family Medicine

## 2016-03-07 ENCOUNTER — Telehealth: Payer: Self-pay

## 2016-03-07 NOTE — Telephone Encounter (Signed)
OK continue ibuprofen as prescribed for pain relief, wear boot to take pressure off foot , sched oV for f/u in next 1 to 2 weeks here

## 2016-03-08 NOTE — Telephone Encounter (Signed)
Patient aware and scheduled for 12/27

## 2016-03-20 ENCOUNTER — Encounter: Payer: Self-pay | Admitting: Family Medicine

## 2016-03-20 ENCOUNTER — Ambulatory Visit (INDEPENDENT_AMBULATORY_CARE_PROVIDER_SITE_OTHER): Payer: Commercial Managed Care - HMO | Admitting: Family Medicine

## 2016-03-20 VITALS — BP 120/78 | HR 84 | Resp 16 | Ht 63.0 in | Wt 200.0 lb

## 2016-03-20 DIAGNOSIS — S93402D Sprain of unspecified ligament of left ankle, subsequent encounter: Secondary | ICD-10-CM | POA: Diagnosis not present

## 2016-03-20 DIAGNOSIS — I10 Essential (primary) hypertension: Secondary | ICD-10-CM | POA: Diagnosis not present

## 2016-03-20 DIAGNOSIS — S93402A Sprain of unspecified ligament of left ankle, initial encounter: Secondary | ICD-10-CM | POA: Insufficient documentation

## 2016-03-20 MED ORDER — IBUPROFEN 800 MG PO TABS: ORAL_TABLET | ORAL | 0 refills | Status: DC

## 2016-03-20 NOTE — Patient Instructions (Addendum)
F/u 2 months, call if you need me sooner  Script for hard  Shoe, ibuprofen once daily as needed, and referral to pT for management of left ankle sprain  Call mid January if not making progress, you will be referred to orthopedics

## 2016-03-20 NOTE — Assessment & Plan Note (Addendum)
Improving, needs boot and PT, will also prescribe once daily ibuprofen as needed

## 2016-03-21 NOTE — Assessment & Plan Note (Signed)
Controlled, no change in medication  

## 2016-03-21 NOTE — Progress Notes (Signed)
   Rebecca Rodgers     MRN: [id number]      DOB: 09/10/1955   HPI Ms. Rebecca Rodgers is here for follow up of left ankle pain ,following a fall. Seen in urgent care, x ray negative for fracture, dx as a sprain. Using crutches, rates pain at a 4, improving, reports improved mobility. Reports falling in her bathroom once since the initial injury ROS Denies recent fever or chills. Denies sinus pressure, nasal congestion, ear pain or sore throat. Denies chest congestion, productive cough or wheezing. Denies chest pains, palpitations and leg swelling Denies abdominal pain, nausea, vomiting,diarrhea or constipation.   Denies dysuria, frequency, hesitancy or incontinence.  PE  BP 120/78   Pulse 84   Resp 16   Ht 5\' 3"  (1.6 m)   Wt 200 lb (90.7 kg)   SpO2 98%   BMI 35.43 kg/m   Patient alert and oriented and in no cardiopulmonary distress.  HEENT: No facial asymmetry, EOMI,   oropharynx pink and moist.  Neck supple no JVD, no mass.  Chest: Clear to auscultation bilaterally.  CVS: S1, S2 no murmurs, no S3.Regular rate.  ABD: Soft non tender.   Ext: No edema  MS: Adequate ROM spine, shoulders, hips and knees.decreased ROM left ankle, no swelling, tender over lateral malleolus  Skin: Intact, no ulcerations or rash noted.  Psych: Good eye contact, normal affect. Memory intact not anxious or depressed appearing.  CNS: CN 2-12 intact, power,  normal throughout.no focal deficits noted.   Assessment & Plan  Sprain of left ankle Improving, needs boot and PT, will also prescribe once daily ibuprofen as needed  Essential hypertension Controlled, no change in medication

## 2016-03-27 ENCOUNTER — Other Ambulatory Visit: Payer: Self-pay | Admitting: Family Medicine

## 2016-03-29 ENCOUNTER — Encounter: Payer: Self-pay | Admitting: Physical Therapy

## 2016-03-29 ENCOUNTER — Ambulatory Visit: Payer: Commercial Managed Care - HMO | Attending: Family Medicine | Admitting: Physical Therapy

## 2016-03-29 DIAGNOSIS — R600 Localized edema: Secondary | ICD-10-CM

## 2016-03-29 DIAGNOSIS — R2689 Other abnormalities of gait and mobility: Secondary | ICD-10-CM | POA: Insufficient documentation

## 2016-03-29 NOTE — Therapy (Addendum)
[facility] [address] [place], [place], [address] Phone: [phone]   Fax:  [phone]  Physical Therapy Evaluation  Patient Details  Name: Rebecca Rodgers MRN: [id number] Date of Birth: [birth year] Referring Provider: Syliva Overman  Encounter Date: 03/29/2016      PT End of Session - 03/29/16 0907    Visit Number 1   Number of Visits 16   Date for PT Re-Evaluation 05/24/16   PT Start Time 0907   PT Stop Time 0936   PT Time Calculation (min) 29 min   Activity Tolerance Patient tolerated treatment well   Behavior During Therapy [facility] for tasks assessed/performed      Past Medical History:  Diagnosis Date  . Asthma   . Hx of hysterectomy   . Hyperlipidemia   . Hypertension   . Hypothyroidism   . Suicide attempt 1997   when a relationship broke up    . Suicide attempt    3 months ago because of ill health of her parents   . Uterine cancer (HCC) 2005    Past Surgical History:  Procedure Laterality Date  . ABDOMINAL HYSTERECTOMY    . CARPAL TUNNEL RELEASE  1998   bilateral    . COLONOSCOPY  12/20/2010   Procedure: COLONOSCOPY;  Surgeon: Malissa Hippo, MD;  Location: [facility]ITE;  Service: Endoscopy;  Laterality: N/A;  . DILATION AND CURETTAGE OF UTERUS     at 20   . VESICOVAGINAL FISTULA CLOSURE W/ TAH  2002   dur to uterine cancer     There were no vitals filed for this visit.       Subjective Assessment - 03/29/16 0911    Subjective Patient slipped on the ice 03/02/16. She went to urgent care 03/03/16 and an xray showed hairline fracture. She saw her MD on 03/20/16 and has been in a boot ever since. She reports that her swelling has gone down.  She wears a brace at night.   Currently in Pain? No/denies   Pain Score 5    Pain Location Ankle   Pain Orientation Left;Lateral   Pain Descriptors / Indicators Sharp   Pain Type Acute pain   Pain Radiating Towards lateral lower leg   Pain Onset 1 to 4 weeks ago    Pain Frequency Intermittent   Aggravating Factors  weightbearing   Pain Relieving Factors elevating            [facility] PT Assessment - 03/29/16 0001      Assessment   Medical Diagnosis sprain of Left ankle   Referring Provider Syliva Overman   Onset Date/Surgical Date 03/02/16   Next MD Visit March 2018     Precautions   Precautions None   Required Braces or Orthoses Other Brace/Splint  cam boot     Restrictions   Weight Bearing Restrictions No     Balance Screen   Has the patient fallen in the past 6 months Yes   How many times? 2   Has the patient had a decrease in activity level because of a fear of falling?  Yes   Is the patient reluctant to leave their home because of a fear of falling?  No     Home Environment   Living Environment Private residence   Living Arrangements Spouse/significant other   Type of Home House   Home Access Stairs to enter   Entrance Stairs-Number of Steps 2   Entrance Stairs-Rails None   Home Layout  One level   Home Equipment Crutches     Prior Function   Level of Independence Independent with basic ADLs   Vocation On disability     Observation/Other Assessments-Edema    Edema Figure 8     Figure 8 Edema   Figure 8 - Right  49 cm   Figure 8 - Left  51 cm     ROM / Strength   AROM / PROM / Strength AROM;PROM;Strength     AROM   AROM Assessment Site Ankle   Right/Left Ankle Left   Left Ankle Dorsiflexion 9   Left Ankle Plantar Flexion 55   Left Ankle Inversion 31   Left Ankle Eversion 45     PROM   PROM Assessment Site Ankle   Right/Left Ankle Left   Left Ankle Dorsiflexion 19   Left Ankle Inversion 45   Left Ankle Eversion 59     Strength   Overall Strength Comments left ankle 5/5 DF; inv/ever 4+/5; PF not tested     Palpation   Palpation comment mild tenderness ant talofib ligament     Ambulation/Gait   Ambulation/Gait Yes   Ambulation/Gait Assistance 7: Independent   Ambulation Distance (Feet) 10 Feet    Assistive device None   Gait Pattern Step-through pattern;Decreased step length - right;Decreased stance time - left;Decreased dorsiflexion - left   Ambulation Surface Level   Gait Comments patient amb as if she is wearing the boot even when she is not.                           PT Education - 03/29/16 0940    Education provided Yes   Education Details HEP   Person(s) Educated Patient   Methods Demonstration;Explanation;Handout   Comprehension Verbalized understanding;Returned demonstration             PT Long Term Goals - 03/29/16 2312      PT LONG TERM GOAL #1   Title Patient able to ambulate with a normal gait pattern.   Time 4   Period Weeks   Status New     PT LONG TERM GOAL #2   Title Patient to demonstrate LLE single leg balance activities WNL.   Time 4   Period Weeks   Status New     PT LONG TERM GOAL #3   Title Patient able to climb stairs with a reciprocal gait pattern.   Time 4   Period Weeks   Status New     PT LONG TERM GOAL #4   Title Patient able to perform ADLS with 1/10 pain or less in the left ankle   Time 4   Period Weeks   Status New               Plan - 03/29/16 0941    Clinical Impression Statement Patient presents with diagnosis of left ankle sprain. She reported that urgent care told her she had a hairline fracture as well. She wears a CAM Boot with WB but can fully WB and walk without pain. She has gait and strength deficits, edema and intermittent pain.   Rehab Potential Excellent   PT Frequency 2x / week  1-2x/wk   PT Duration 4 weeks   PT Treatment/Interventions ADLs/Self Care Home Management;Cryotherapy;Retail banker;Therapeutic exercise;Balance training;Neuromuscular re-education;Manual techniques;Vasopneumatic Device   PT Next Visit Plan gait, balance, ankle strengthening   PT Home Exercise Plan AROM PF/DF/Inv/Eversion; alphabet   Consulted and  Agree with Plan of  Care Patient      Patient will benefit from skilled therapeutic intervention in order to improve the following deficits and impairments:  Abnormal gait, Pain, Increased edema, Decreased strength  Visit Diagnosis: Other abnormalities of gait and mobility - Plan: PT plan of care cert/re-cert  Localized edema - Plan: PT plan of care cert/re-cert      G-Codes - 01/25/14 0936    Functional Assessment Tool Used clinical judgement   Functional Limitation Mobility: Walking and moving around   Mobility: Walking and Moving Around Current Status [phone]) At least 1 percent but less than 20 percent impaired, limited or restricted   Mobility: Walking and Moving Around Goal Status (F2924) 0 percent impaired, limited or restricted       Problem List Patient Active Problem List   Diagnosis Date Noted  . Sprain of left ankle 03/20/2016  . Need for prophylactic vaccination and inoculation against influenza 02/28/2016  . Annual physical exam 11/07/2014  . Hyperlipidemia 08/30/2012  . Metabolic syndrome X 46/28/6381  . OA (osteoarthritis) of knee 01/22/2012  . Allergic rhinitis 07/21/2010  . Cough variant asthma 11/29/2009  . Obesity 01/08/2009  . Hypothyroidism 08/23/2008  . Depression with anxiety 08/23/2008  . Essential hypertension 08/23/2008    Madelyn Flavors PT 03/29/2016, [facility] [address] [place], [place], [address] Phone: [phone]   Fax:  [phone]  Name: Rebecca Rodgers MRN: [id number] Date of Birth: 23-Mar-1956  PHYSICAL THERAPY DISCHARGE SUMMARY  Visits from Start of Care: 1.  Current functional level related to goals / functional outcomes: See above.   Remaining deficits: See below.   Education / Equipment:  Plan: Patient agrees to discharge.  Patient goals were not met. Patient is being discharged due to not returning since the last visit.  ?????         Mali Applegate MPT

## 2016-03-29 NOTE — Patient Instructions (Signed)
  Ankle Alphabet   Using left ankle and foot only, trace the letters of the alphabet. Perform A to Z. Repeat _1___ times per set. Do ____ sets per session. Do __2-3__ sessions per day.  http://orth.exer.us/16   Copyright  VHI. All rights reserved.    Ankle Circles   Slowly rotate right foot and ankle clockwise then counterclockwise. Gradually increase range of motion. Avoid pain. Circle __10__ times each direction per set. Do ____ sets per session. Do 2-3____ sessions per day.  http://orth.exer.us/30   Copyright  VHI. All rights reserved.    ROM: Plantar / Dorsiflexion   With left leg relaxed, gently flex and extend ankle. Move through full range of motion. Avoid pain. Repeat __20__ times per set. Do ____ sets per session. Do __3-4__ sessions per day.  http://orth.exer.us/34   Copyright  VHI. All rights reserved.    ROM: Inversion / Eversion   With left leg relaxed, gently turn ankle and foot in and out. Move through full range of motion. Avoid pain. Repeat _20___ times per set. Do ____ sets per session. Do _3-4___ sessions per day.  http://orth.exer.us/36  Madelyn Flavors, PT 03/29/16 9:36 AM  [facility] [address] [place], [place], [address] Phone: [phone]   Fax:  [phone]

## 2016-04-05 ENCOUNTER — Other Ambulatory Visit (INDEPENDENT_AMBULATORY_CARE_PROVIDER_SITE_OTHER): Payer: Self-pay | Admitting: *Deleted

## 2016-04-05 DIAGNOSIS — Z860100 Personal history of colon polyps, unspecified: Secondary | ICD-10-CM | POA: Insufficient documentation

## 2016-04-26 ENCOUNTER — Other Ambulatory Visit: Payer: Self-pay | Admitting: Family Medicine

## 2016-05-07 ENCOUNTER — Ambulatory Visit: Payer: Self-pay

## 2016-05-21 ENCOUNTER — Ambulatory Visit (INDEPENDENT_AMBULATORY_CARE_PROVIDER_SITE_OTHER): Payer: Medicare HMO

## 2016-05-21 ENCOUNTER — Ambulatory Visit: Payer: Self-pay | Admitting: Family Medicine

## 2016-05-21 VITALS — BP 128/74 | HR 87 | Temp 98.2°F | Ht 63.0 in | Wt 198.1 lb

## 2016-05-21 DIAGNOSIS — Z0000 Encounter for general adult medical examination without abnormal findings: Secondary | ICD-10-CM

## 2016-05-21 NOTE — Patient Instructions (Addendum)
Ms. Rebecca Rodgers , Thank you for taking time to come for your Medicare Wellness Visit. I appreciate your ongoing commitment to your health goals. Please review the following plan we discussed and let me know if I can assist you in the future.   Screening recommendations: You are due for a colonoscopy, this is scheduled for you on 06/27/2016 at 10:25 am. Please make sure you keep this appointment. You will be due for your mammogram next month. Please call and schedule this, if you need assistance scheduling please call our office for assistance.  Abnormal screenings: None  Patient concerns: Need hearing aid for left ear. Community resource referral sent today to help assist you with this. You should receive a call from our care guide Amy within the next month.  Nurse concerns: Obesity. Recommend eating 3 meals a day and cutting back on your carbohydrate intake  Next appt: Follow up with Dr. Moshe Cipro on 06/26/2016 at 9:00am for your annual physical. Follow up in 1 year for your annual wellness visit.  Advance directive discussed with patient today. Copy provided for patient to complete at home and have notarized. Patient agrees to have copy sent to our office once it is complete.    Preventive Care 40-64 Years, Female Preventive care refers to lifestyle choices and visits with your health care provider that can promote health and wellness. What does preventive care include?  A yearly physical exam. This is also called an annual well check.  Dental exams once or twice a year.  Routine eye exams. Ask your health care provider how often you should have your eyes checked.  Personal lifestyle choices, including:  Daily care of your teeth and gums.  Regular physical activity.  Eating a healthy diet.  Avoiding tobacco and drug use.  Limiting alcohol use.  Taking low-dose aspirin daily starting at age 54.  Taking vitamin and mineral supplements as recommended by your health care provider. What  happens during an annual well check? The services and screenings done by your health care provider during your annual well check will depend on your age, overall health, lifestyle risk factors, and family history of disease. Counseling  Your health care provider may ask you questions about your:  Alcohol use.  Tobacco use.  Drug use.  Emotional well-being.  Home and relationship well-being.  Sexual activity.  Eating habits.  Work and work Statistician.  Method of birth control.  Menstrual cycle.  Pregnancy history. Screening  You may have the following tests or measurements:  Height, weight, and BMI.  Blood pressure.  Lipid and cholesterol levels. These may be checked every 5 years, or more frequently if you are over 53 years old.  Skin check.  Lung cancer screening. You may have this screening every year starting at age 38 if you have a 30-pack-year history of smoking and currently smoke or have quit within the past 15 years.  Fecal occult blood test (FOBT) of the stool. You may have this test every year starting at age 53.  Flexible sigmoidoscopy or colonoscopy. You may have a sigmoidoscopy every 5 years or a colonoscopy every 10 years starting at age 37.  Hepatitis C blood test.  Diabetes screening. This is done by checking your blood sugar (glucose) after you have not eaten for a while (fasting). You may have this done every 1-3 years.  Mammogram. This may be done every 1-2 years. Talk to your health care provider about when you should start having regular mammograms. This may depend  on whether you have a family history of breast cancer.  BRCA-related cancer screening. This may be done if you have a family history of breast, ovarian, tubal, or peritoneal cancers.  Pelvic exam and Pap test. This may be done every 3 years starting at age 66. Starting at age 37, this may be done every 5 years if you have a Pap test in combination with an HPV test.  Bone density  scan. This is done to screen for osteoporosis. You may have this scan if you are at high risk for osteoporosis. Discuss your test results, treatment options, and if necessary, the need for more tests with your health care provider. Vaccines  Your health care provider may recommend certain vaccines, such as:  Influenza vaccine. This is recommended every year.  Tetanus, diphtheria, and acellular pertussis (Tdap, Td) vaccine. You may need a Td booster every 10 years.  Zoster vaccine. You may need this after age 96.  Pneumococcal 13-valent conjugate (PCV13) vaccine. You may need this if you have certain conditions and were not previously vaccinated.  Pneumococcal polysaccharide (PPSV23) vaccine. You may need one or two doses if you smoke cigarettes or if you have certain conditions.  Talk to your health care provider about which screenings and vaccines you need and how often you need them. This information is not intended to replace advice given to you by your health care provider. Make sure you discuss any questions you have with your health care provider. Document Released: 04/07/2015 Document Revised: 11/29/2015 Document Reviewed: 01/10/2015 Elsevier Interactive Patient Education  2017 [employer].

## 2016-05-21 NOTE — Progress Notes (Signed)
Subjective:   Rebecca Rodgers is a 61 y.o. female who presents for Medicare Annual (Subsequent) preventive examination.  Cardiac Risk Factors include: dyslipidemia;hypertension;obesity (BMI >30kg/m2);family history of premature cardiovascular disease     Objective:     Vitals: BP 128/74   Pulse 87   Temp 98.2 F (36.8 C) (Oral)   Ht 5\' 3"  (1.6 m)   Wt 198 lb 1.9 oz (89.9 kg)   SpO2 95%   BMI 35.10 kg/m   Body mass index is 35.1 kg/m.   Tobacco History  Smoking Status  . Former Smoker  . Packs/day: 0.25  . Years: 15.00  . Types: Cigarettes  . Quit date: 02/08/2015  Smokeless Tobacco  . Former Systems developer  . Types: Chew  . Quit date: 02/18/2016     Counseling given: Not Answered   Past Medical History:  Diagnosis Date  . Asthma   . Hx of hysterectomy   . Hyperlipidemia   . Hypertension   . Hypothyroidism   . Suicide attempt 1997   when a relationship broke up    . Suicide attempt    3 months ago because of ill health of her parents   . Uterine cancer ([place]) 2005   Past Surgical History:  Procedure Laterality Date  . ABDOMINAL HYSTERECTOMY    . CARPAL TUNNEL RELEASE  1998   bilateral    . COLONOSCOPY  12/20/2010   Procedure: COLONOSCOPY;  Surgeon: Rogene Houston, MD;  [facility];  Service: Endoscopy;  Laterality: N/A;  . DILATION AND CURETTAGE OF UTERUS     at 20   . VESICOVAGINAL FISTULA CLOSURE W/ TAH  2002   dur to uterine cancer    Family History  Problem Relation Age of Onset  . Cataracts Mother   . Hypertension Mother   . Hyperlipidemia Mother   . Dementia Mother   . Depression Mother   . Coronary artery disease Father   . Hyperlipidemia Father   . Stroke Father   . Cancer Sister 54    breast   . Cancer Brother 85    prostate   History  Sexual Activity  . Sexual activity: Not Currently  . Birth control/ protection: Other-see comments, Surgical    Outpatient Encounter Prescriptions as of 05/21/2016  Medication Sig  .  amLODipine (NORVASC) 10 MG tablet Take 1 tablet (10 mg total) by mouth daily.  Marland Kitchen buPROPion (WELLBUTRIN XL) 150 MG 24 hr tablet Take 1 tablet (150 mg total) by mouth every morning.  . cholecalciferol (VITAMIN D) 1000 units tablet Take 1,000 Units by mouth daily.  . clotrimazole-betamethasone (LOTRISONE) cream APPLY TO AFFECTED AREAS TWICE A DAY  . ibuprofen (ADVIL,MOTRIN) 800 MG tablet One tablet once daily , as needed, for left ankle pain  . lamoTRIgine (LAMICTAL) 200 MG tablet Take 1 tablet (200 mg total) by mouth daily.  Marland Kitchen levothyroxine (SYNTHROID, LEVOTHROID) 50 MCG tablet TAKE 1 TABLET DAILY MON-SAT AND 1&1/2 TABLETS ON SUN  . meclizine (ANTIVERT) 25 MG tablet Take 1 tablet (25 mg total) by mouth 3 (three) times daily as needed for dizziness.  Marland Kitchen OLANZapine (ZYPREXA) 5 MG tablet Take 1 tablet (5 mg total) by mouth at bedtime.  Marland Kitchen omeprazole (PRILOSEC) 20 MG capsule Take 20 mg by mouth 2 (two) times daily before a meal.   . rosuvastatin (CRESTOR) 40 MG tablet Take 1 tablet (40 mg total) by mouth daily.  . SYMBICORT 160-4.5 MCG/ACT inhaler 2 PUFFS 2 TIMES A DAY  .  traZODone (DESYREL) 100 MG tablet Take 1 tablet (100 mg total) by mouth at bedtime.  . triamterene-hydrochlorothiazide (MAXZIDE-25) 37.5-25 MG tablet Take 1 tablet by mouth daily.  . VOLTAREN 1 % GEL APPLY TO THE AFFECTED AREA(S) TWICE A DAY AS NEEDED FOR PAIN  . [DISCONTINUED] Cholecalciferol (VITAMIN D-400 PO) Take 400 Units by mouth every 7 (seven) days.   No facility-administered encounter medications on file as of 05/21/2016.     Activities of Daily Living In your present state of health, do you have any difficulty performing the following activities: 05/21/2016  Hearing? N  Vision? Y  Difficulty concentrating or making decisions? Y  Walking or climbing stairs? Y  Dressing or bathing? N  Doing errands, shopping? N  Preparing Food and eating ? N  Using the Toilet? N  In the past six months, have you accidently leaked urine?  N  Do you have problems with loss of bowel control? N  Managing your Medications? N  Managing your Finances? N  Housekeeping or managing your Housekeeping? N  Some recent data might be hidden    Patient Care Team: Fayrene Helper, MD as PCP - General Cloria Spring, MD as Consulting Physician ([place]) Carole Civil, MD as Consulting Physician (Orthopedic Surgery)    Assessment:    Exercise Activities and Dietary recommendations Current Exercise Habits: Structured exercise class, Type of exercise: Other - see comments;treadmill;stretching (Zumba), Time (Minutes): 40, Frequency (Times/Week): 4, Weekly Exercise (Minutes/Week): 160, Intensity: Mild, Exercise limited by: None identified  Goals    . decrease carbohydrates          Recommend decreasing simple carbohydrates.      Fall Risk Fall Risk  05/21/2016 03/20/2016 08/15/2015 03/29/2015 03/10/2014  Falls in the past year? Yes Yes No No No  Number falls in past yr: 2 or more 2 or more - - -  Injury with Fall? Yes - - - -  Follow up Falls evaluation completed;Falls prevention discussed - - - -   Depression Screen PHQ 2/9 Scores 05/21/2016 08/15/2015  PHQ - 2 Score 0 1  PHQ- 9 Score - 5     Cognitive Function Normal 6CIT Screen 05/21/2016  What Year? 0 points  What month? 0 points  What time? 0 points  Count back from 20 0 points  Months in reverse 0 points  Repeat phrase 0 points  Total Score 0    Immunization History  Administered Date(s) Administered  . Influenza Split 01/08/2012, 12/08/2013, 01/06/2015  . Influenza Whole 12/29/2008, 11/28/2010  . Influenza,inj,Quad PF,36+ Mos 01/15/2013, 02/20/2016  . Pneumococcal Conjugate-13 10/26/2013  . Tdap 03/26/2010   Screening Tests Health Maintenance  Topic Date Due  . COLONOSCOPY  06/27/2016 (Originally 12/20/2015)  . MAMMOGRAM  06/13/2017  . PAP SMEAR  11/06/2017  . TETANUS/TDAP  03/26/2020  . INFLUENZA VACCINE  Completed  . Hepatitis C Screening   Completed  . HIV Screening  Completed      Plan:  I have personally reviewed and addressed the Medicare Annual Wellness questionnaire and have noted the following in the patient's chart:  A. Medical and social history B. Use of alcohol, tobacco or illicit drugs  C. Current medications and supplements D. Functional ability and status E.  Nutritional status F.  Physical activity G. Advance directives H. List of other physicians I.  Hospitalizations, surgeries, and ER visits in previous 12 months J.  [place] to include cognitive, depression, and falls L. Referrals and appointments - Community  resource referral to connected care for assistance with purchasing a hearing aid.  In addition, I have reviewed and discussed with patient certain preventive protocols, quality metrics, and best practice recommendations. A written personalized care plan for preventive services as well as general preventive health recommendations were provided to patient.  Signed,   Stormy Fabian, LPN Lead Nurse Health Advisor

## 2016-05-23 ENCOUNTER — Other Ambulatory Visit: Payer: Self-pay | Admitting: Family Medicine

## 2016-05-24 ENCOUNTER — Encounter (INDEPENDENT_AMBULATORY_CARE_PROVIDER_SITE_OTHER): Payer: Self-pay | Admitting: *Deleted

## 2016-05-24 ENCOUNTER — Telehealth (INDEPENDENT_AMBULATORY_CARE_PROVIDER_SITE_OTHER): Payer: Self-pay | Admitting: *Deleted

## 2016-05-24 NOTE — Telephone Encounter (Signed)
Patient needs trilyte 

## 2016-05-27 MED ORDER — PEG 3350-KCL-NA BICARB-NACL 420 G PO SOLR: 4000.0000 mL | Freq: Once | ORAL | 0 refills | Status: AC

## 2016-05-28 ENCOUNTER — Telehealth (INDEPENDENT_AMBULATORY_CARE_PROVIDER_SITE_OTHER): Payer: Self-pay | Admitting: *Deleted

## 2016-05-28 NOTE — Telephone Encounter (Signed)
Referring MD/PCP: simpson   Procedure: tcs  Reason/Indication:  Hx plyps  Has patient had this procedure before?  Yes, 2012  If so, when, by whom and where?    Is there a family history of colon cancer?  no  Who?  What age when diagnosed?    Is patient diabetic?   no      Does patient have prosthetic heart valve or mechanical valve?  no  Do you have a pacemaker?  no  Has patient ever had endocarditis? no  Has patient had joint replacement within last 12 months?  no  Does patient tend to be constipated or take laxatives? no  Does patient have a history of alcohol/drug use?  no  Is patient on Coumadin, Plavix and/or Aspirin? no  Medications: see epic  Allergies: see epic  Medication Adjustment per Dr Laural Golden:   Procedure date & time: 06/27/16 at 1030

## 2016-05-29 NOTE — Telephone Encounter (Signed)
agree

## 2016-06-26 ENCOUNTER — Encounter: Payer: Self-pay | Admitting: Family Medicine

## 2016-06-26 ENCOUNTER — Ambulatory Visit (INDEPENDENT_AMBULATORY_CARE_PROVIDER_SITE_OTHER): Payer: Medicare HMO | Admitting: Family Medicine

## 2016-06-26 VITALS — BP 120/68 | HR 88 | Temp 98.3°F | Resp 18 | Ht 63.0 in | Wt 198.0 lb

## 2016-06-26 DIAGNOSIS — E6609 Other obesity due to excess calories: Secondary | ICD-10-CM | POA: Diagnosis not present

## 2016-06-26 DIAGNOSIS — E785 Hyperlipidemia, unspecified: Secondary | ICD-10-CM

## 2016-06-26 DIAGNOSIS — I10 Essential (primary) hypertension: Secondary | ICD-10-CM

## 2016-06-26 DIAGNOSIS — F418 Other specified anxiety disorders: Secondary | ICD-10-CM | POA: Diagnosis not present

## 2016-06-26 DIAGNOSIS — E038 Other specified hypothyroidism: Secondary | ICD-10-CM | POA: Diagnosis not present

## 2016-06-26 DIAGNOSIS — IMO0001 Reserved for inherently not codable concepts without codable children: Secondary | ICD-10-CM

## 2016-06-26 DIAGNOSIS — R7301 Impaired fasting glucose: Secondary | ICD-10-CM

## 2016-06-26 DIAGNOSIS — J45991 Cough variant asthma: Secondary | ICD-10-CM

## 2016-06-26 MED ORDER — MONTELUKAST SODIUM 10 MG PO TABS: 10.0000 mg | ORAL_TABLET | Freq: Every day | ORAL | 3 refills | Status: DC

## 2016-06-26 MED ORDER — ROSUVASTATIN CALCIUM 40 MG PO TABS: ORAL_TABLET | ORAL | 1 refills | Status: DC

## 2016-06-26 NOTE — Assessment & Plan Note (Signed)
Controlled, no change in medication  

## 2016-06-26 NOTE — Assessment & Plan Note (Signed)
.  unchanged. atient re-educated about  the importance of commitment to a  minimum of 150 minutes of exercise per week.  The importance of healthy food choices with portion control discussed. Encouraged to start a food diary, count calories and to consider  joining a support group. Sample diet sheets offered. Goals set by the patient for the next several months.   Weight /BMI 06/26/2016 05/21/2016 03/20/2016  WEIGHT 198 lb 0.6 oz 198 lb 1.9 oz 200 lb  HEIGHT 5\' 3"  5\' 3"  5\' 3"   BMI 35.08 kg/m2 35.1 kg/m2 35.43 kg/m2  Some encounter information is confidential and restricted. Go to Review Flowsheets activity to see all data.

## 2016-06-26 NOTE — Progress Notes (Signed)
Rebecca Rodgers     MRN: [id number]      DOB: 09-28-1955   HPI Rebecca Rodgers is here for follow up and re-evaluation of chronic medical conditions, medication management and review of any available recent lab and radiology data.  Preventive health is updated, specifically  Cancer screening and Immunization.   Questions or concerns regarding consultations or procedures which the PT has had in the interim are  addressed. The PT denies any adverse reactions to current medications since the last visit.  There are no new concerns.  There are no specific complaints   ROS Denies recent fever or chills. Denies sinus pressure, nasal congestion, ear pain or sore throat. Denies chest congestion, productive cough or wheezing. Denies chest pains, palpitations and leg swelling Denies abdominal pain, nausea, vomiting,diarrhea or constipation.   Denies dysuria, frequency, hesitancy or incontinence. Denies joint pain, swelling and limitation in mobility. Denies headaches, seizures, numbness, or tingling. Denies uncontrolled  depression, anxiety or insomnia. Denies skin break down or rash.   PE  BP 120/68 (BP Location: Left Arm, Patient Position: Sitting, Cuff Size: Normal)   Pulse 88   Temp 98.3 F (36.8 C) (Temporal)   Resp 18   Ht 5\' 3"  (1.6 m)   Wt 198 lb 0.6 oz (89.8 kg)   SpO2 97%   BMI 35.08 kg/m   Patient alert and oriented and in no cardiopulmonary distress.  HEENT: No facial asymmetry, EOMI,   oropharynx pink and moist.  Neck supple no JVD, no mass.  Chest: Clear to auscultation bilaterally.  CVS: S1, S2 no murmurs, no S3.Regular rate.  ABD: Soft non tender.   Ext: No edema  MS: Adequate ROM spine, shoulders, hips and knees.  Skin: Intact, no ulcerations or rash noted.  Psych: Good eye contact, normal affect. Memory intact not anxious or depressed appearing.  CNS: CN 2-12 intact, power,  normal throughout.no focal deficits noted.   Assessment & Plan Essential  hypertension Controlled, no change in medication DASH diet and commitment to daily physical activity for a minimum of 30 minutes discussed and encouraged, as a part of hypertension management. The importance of attaining a healthy weight is also discussed.  BP/Weight 06/26/2016 05/21/2016 03/20/2016 02/20/2016 08/15/2015 03/29/2015 9/93/7169  Systolic BP 678 938 101 751 025 852 778  Diastolic BP 68 74 78 74 80 70 70  Wt. (Lbs) 198.04 198.12 200 200 187 190 -  BMI 35.08 35.1 35.43 35.43 33.13 33.67 -  Some encounter information is confidential and restricted. Go to Review Flowsheets activity to see all data.       Depression with anxiety Managed by psych and doing well  Hyperlipidemia Hyperlipidemia:Low fat diet discussed and encouraged.   Lipid Panel  Lab Results  Component Value Date   CHOL 190 01/04/2016   HDL 70 01/04/2016   LDLCALC 100 01/04/2016   TRIG 99 01/04/2016   CHOLHDL 2.7 01/04/2016     Updated lab needed    Obesity .unchanged. atient re-educated about  the importance of commitment to a  minimum of 150 minutes of exercise per week.  The importance of healthy food choices with portion control discussed. Encouraged to start a food diary, count calories and to consider  joining a support group. Sample diet sheets offered. Goals set by the patient for the next several months.   Weight /BMI 06/26/2016 05/21/2016 03/20/2016  WEIGHT 198 lb 0.6 oz 198 lb 1.9 oz 200 lb  HEIGHT 5\' 3"  5\' 3"  5\' 3"   BMI 35.08 kg/m2 35.1 kg/m2 35.43 kg/m2  Some encounter information is confidential and restricted. Go to Review Flowsheets activity to see all data.      Cough variant asthma Controlled, no change in medication

## 2016-06-26 NOTE — Assessment & Plan Note (Signed)
Controlled, no change in medication DASH diet and commitment to daily physical activity for a minimum of 30 minutes discussed and encouraged, as a part of hypertension management. The importance of attaining a healthy weight is also discussed.  BP/Weight 06/26/2016 05/21/2016 03/20/2016 02/20/2016 08/15/2015 03/29/2015 06/15/1989  Systolic BP 444 584 835 075 732 256 720  Diastolic BP 68 74 78 74 80 70 70  Wt. (Lbs) 198.04 198.12 200 200 187 190 -  BMI 35.08 35.1 35.43 35.43 33.13 33.67 -  Some encounter information is confidential and restricted. Go to Review Flowsheets activity to see all data.

## 2016-06-26 NOTE — Assessment & Plan Note (Signed)
Managed by psych and doing well 

## 2016-06-26 NOTE — Assessment & Plan Note (Signed)
Hyperlipidemia:Low fat diet discussed and encouraged.   Lipid Panel  Lab Results  Component Value Date   CHOL 190 01/04/2016   HDL 70 01/04/2016   LDLCALC 100 01/04/2016   TRIG 99 01/04/2016   CHOLHDL 2.7 01/04/2016     Updated lab needed

## 2016-06-26 NOTE — Patient Instructions (Addendum)
Physical exam Nov 21 or shortly after, call if you need me before  Congrats on remaining nicotine free for over 1 year  Fasting CBC, lipid, cmp and eGFr, hBA1C, TSH mid May  Please work on good  health habits so that your health will improve. 1. Commitment to daily physical activity for 30 to 60  minutes, if you are able to do this.  2. Commitment to wise food choices. Aim for half of your  food intake to be vegetable and fruit, one quarter starchy foods, and one quarter protein. Try to eat on a regular schedule  3 meals per day, snacking between meals should be limited to vegetables or fruits or small portions of nuts. 64 ounces of water per day is generally recommended, unless you have specific health conditions, like heart failure or kidney failure where you will need to limit fluid intake.  3. Commitment to sufficient and a  good quality of physical and mental rest daily, generally between 6 to 8 hours per day.  WITH PERSISTANCE AND PERSEVERANCE, THE IMPOSSIBLE , BECOMES THE NORM! It is important that you exercise regularly at least 30 minutes 5 times a week. If you develop chest pain, have severe difficulty breathing, or feel very tired, stop exercising immediately and seek medical attention

## 2016-06-27 ENCOUNTER — Encounter (HOSPITAL_COMMUNITY)
Admission: RE | Disposition: A | Discharge status: Home / Self Care | Payer: Self-pay | Source: Ambulatory Visit | Attending: Internal Medicine

## 2016-06-27 ENCOUNTER — Encounter (HOSPITAL_COMMUNITY): Payer: Self-pay

## 2016-06-27 ENCOUNTER — Ambulatory Visit (HOSPITAL_COMMUNITY)
Admission: RE | Admit: 2016-06-27 | Discharge: 2016-06-27 | Disposition: A | Discharge status: Home / Self Care | Payer: Medicare HMO | Source: Ambulatory Visit | Attending: Internal Medicine | Admitting: Internal Medicine

## 2016-06-27 DIAGNOSIS — E039 Hypothyroidism, unspecified: Secondary | ICD-10-CM | POA: Insufficient documentation

## 2016-06-27 DIAGNOSIS — Z880 Allergy status to penicillin: Secondary | ICD-10-CM | POA: Diagnosis not present

## 2016-06-27 DIAGNOSIS — Z79899 Other long term (current) drug therapy: Secondary | ICD-10-CM | POA: Insufficient documentation

## 2016-06-27 DIAGNOSIS — I10 Essential (primary) hypertension: Secondary | ICD-10-CM | POA: Insufficient documentation

## 2016-06-27 DIAGNOSIS — J45909 Unspecified asthma, uncomplicated: Secondary | ICD-10-CM | POA: Diagnosis not present

## 2016-06-27 DIAGNOSIS — Z09 Encounter for follow-up examination after completed treatment for conditions other than malignant neoplasm: Secondary | ICD-10-CM | POA: Diagnosis not present

## 2016-06-27 DIAGNOSIS — Z8542 Personal history of malignant neoplasm of other parts of uterus: Secondary | ICD-10-CM | POA: Diagnosis not present

## 2016-06-27 DIAGNOSIS — Z860100 Personal history of colon polyps, unspecified: Secondary | ICD-10-CM | POA: Insufficient documentation

## 2016-06-27 DIAGNOSIS — Z1211 Encounter for screening for malignant neoplasm of colon: Secondary | ICD-10-CM | POA: Diagnosis not present

## 2016-06-27 DIAGNOSIS — K644 Residual hemorrhoidal skin tags: Secondary | ICD-10-CM | POA: Diagnosis not present

## 2016-06-27 DIAGNOSIS — E785 Hyperlipidemia, unspecified: Secondary | ICD-10-CM | POA: Insufficient documentation

## 2016-06-27 DIAGNOSIS — Z87891 Personal history of nicotine dependence: Secondary | ICD-10-CM | POA: Insufficient documentation

## 2016-06-27 DIAGNOSIS — K5730 Diverticulosis of large intestine without perforation or abscess without bleeding: Secondary | ICD-10-CM | POA: Insufficient documentation

## 2016-06-27 DIAGNOSIS — Z90710 Acquired absence of both cervix and uterus: Secondary | ICD-10-CM | POA: Insufficient documentation

## 2016-06-27 HISTORY — PX: COLONOSCOPY: SHX5424

## 2016-06-27 SURGERY — COLONOSCOPY
Anesthesia: Moderate Sedation

## 2016-06-27 MED ORDER — MIDAZOLAM HCL 5 MG/5ML IJ SOLN
INTRAMUSCULAR | Status: DC | PRN
  Administered 2016-06-27 (×4): 2 mg via INTRAVENOUS

## 2016-06-27 MED ORDER — MEPERIDINE HCL 50 MG/ML IJ SOLN
INTRAMUSCULAR | Status: DC | PRN
  Administered 2016-06-27 (×2): 25 mg via INTRAVENOUS

## 2016-06-27 MED ORDER — MEPERIDINE HCL 50 MG/ML IJ SOLN
INTRAMUSCULAR | Status: AC
  Filled 2016-06-27: qty 1

## 2016-06-27 MED ORDER — SODIUM CHLORIDE 0.9 % IV SOLN
INTRAVENOUS | Status: DC
  Administered 2016-06-27: 10:00:00 via INTRAVENOUS

## 2016-06-27 MED ORDER — STERILE WATER FOR IRRIGATION IR SOLN
Status: DC | PRN
  Administered 2016-06-27: 11:00:00

## 2016-06-27 MED ORDER — MIDAZOLAM HCL 5 MG/5ML IJ SOLN
INTRAMUSCULAR | Status: AC
  Filled 2016-06-27: qty 10

## 2016-06-27 NOTE — H&P (Signed)
Rebecca Rodgers is an 61 y.o. female.   Chief Complaint: Patient is here for colonoscopy. HPI: Patient is 61-year-old African-American female was history of colonic adenomas and is here for surveillance colonoscopy she had large adenoma removed on colonoscopy of 2009 and 2 small polyps removed in 2012. She denies abdominal pain change in bowel habits or rectal bleeding. Family History is negative for CRC.  Past Medical History:  Diagnosis Date  . Asthma   . Hx of hysterectomy   . Hyperlipidemia   . Hypertension   . Hypothyroidism   . Suicide attempt 1997   when a relationship broke up    . Suicide attempt    3 months ago because of ill health of her parents   . Uterine cancer ([place]) 2005    Past Surgical History:  Procedure Laterality Date  . ABDOMINAL HYSTERECTOMY    . CARPAL TUNNEL RELEASE  1998   bilateral    . COLONOSCOPY  12/20/2010   Procedure: COLONOSCOPY;  Surgeon: Rogene Houston, MD;  [facility];  Service: Endoscopy;  Laterality: N/A;  . DILATION AND CURETTAGE OF UTERUS     at 20   . VESICOVAGINAL FISTULA CLOSURE W/ TAH  2002   dur to uterine cancer     Family History  Problem Relation Age of Onset  . Cataracts Mother   . Hypertension Mother   . Hyperlipidemia Mother   . Dementia Mother   . Depression Mother   . Coronary artery disease Father   . Hyperlipidemia Father   . Stroke Father   . Cancer Sister 79    breast   . Cancer Brother 63    prostate   Social History:  reports that she quit smoking about 16 months ago. Her smoking use included Cigarettes. She has a 3.75 pack-year smoking history. She quit smokeless tobacco use about 4 months ago. Her smokeless tobacco use included Chew. She reports that she does not drink alcohol or use drugs.  Allergies:  Allergies  Allergen Reactions  . Codeine     Lip swelling   . Penicillins     Lip swelling Has patient had a PCN reaction causing immediate rash, facial/tongue/throat swelling, SOB or  lightheadedness with hypotension: Yes Has patient had a PCN reaction causing severe rash involving mucus membranes or skin necrosis: No Has patient had a PCN reaction that required hospitalization No Has patient had a PCN reaction occurring within the last 10 years: No If all of the above answers are "NO", then may proceed with Cephalosporin use.     Medications Prior to Admission  Medication Sig Dispense Refill  . amLODipine (NORVASC) 10 MG tablet Take 1 tablet (10 mg total) by mouth daily. (Patient taking differently: Take 1 tablet (10 mg total) by mouth daily at night) 90 tablet 1  . buPROPion (WELLBUTRIN XL) 150 MG 24 hr tablet Take 150 mg by mouth daily.    . cholecalciferol (VITAMIN D) 1000 units tablet Take 2,000 Units by mouth every Tuesday.     . clotrimazole-betamethasone (LOTRISONE) cream APPLY TO AFFECTED AREAS TWICE A DAY (Patient taking differently: APPLY TO AFFECTED AREAS AT NIGHT) 45 g 2  . ibuprofen (ADVIL,MOTRIN) 800 MG tablet One tablet once daily , as needed, for left ankle pain (Patient taking differently: Take 800mg s once daily as needed, for left ankle pain) 30 tablet 0  . lamoTRIgine (LAMICTAL) 200 MG tablet Take 1 tablet (200 mg total) by mouth daily. 30 tablet 2  .  levothyroxine (SYNTHROID, LEVOTHROID) 50 MCG tablet TAKE 1 TABLET DAILY MON-SAT AND 1&1/2 TABLETS ON SUN 100 tablet 1  . montelukast (SINGULAIR) 10 MG tablet Take 1 tablet (10 mg total) by mouth at bedtime. 30 tablet 3  . OLANZapine (ZYPREXA) 5 MG tablet Take 1 tablet (5 mg total) by mouth at bedtime. 30 tablet 2  . omeprazole (PRILOSEC) 20 MG capsule Take 20 mg by mouth daily.     . rosuvastatin (CRESTOR) 40 MG tablet Take 1 tablet (40 mg total) by mouth daily. 90 tablet 1  . SYMBICORT 160-4.5 MCG/ACT inhaler 2 PUFFS 2 TIMES A DAY 10.2 g 5  . traZODone (DESYREL) 100 MG tablet Take 1 tablet (100 mg total) by mouth at bedtime. 30 tablet 2  . triamterene-hydrochlorothiazide (MAXZIDE-25) 37.5-25 MG tablet Take  1 tablet by mouth daily. (Patient taking differently: Take 1 tablet by mouth daily at night) 90 tablet 1  . VOLTAREN 1 % GEL APPLY TO THE AFFECTED AREA(S) TWICE A DAY AS NEEDED FOR PAIN 100 g 2    No results found for this or any previous visit (from the past 48 hour(s)). No results found.  ROS  Blood pressure 132/76, pulse 73, temperature 98.5 F (36.9 C), temperature source Oral, resp. rate 16, SpO2 100 %. Physical Exam  Constitutional: She appears well-developed and well-nourished.  HENT:  Patient has hearing impairment.  Eyes: Conjunctivae are normal. No scleral icterus.  Neck: No thyromegaly present.  Cardiovascular: Normal rate, regular rhythm and normal heart sounds.   No murmur heard. Respiratory: Effort normal and breath sounds normal.  GI: Soft. She exhibits no distension and no mass. There is no tenderness.  Musculoskeletal: She exhibits no edema.  Lymphadenopathy:    She has no cervical adenopathy.  Neurological: She is alert.  Skin: Skin is warm and dry.     Assessment/Plan History of colonic adenomas. Surveillance colonoscopy.  Hildred Laser, MD 06/27/2016, 11:08 AM

## 2016-06-27 NOTE — Op Note (Signed)
[facility] Patient Name: Rebecca Rodgers Procedure Date: 06/27/2016 11:04 AM MRN: [id number] Date of Birth: 02-18-1956 Attending MD: Hildred Laser , MD CSN: [id number] Age: 61 Admit Type: Outpatient Procedure:                Colonoscopy Indications:              High risk colon cancer surveillance: Personal                            history of colonic polyps Providers:                Hildred Laser, MD, Otis Peak B. Sharon Seller, RN, Sherlyn Lees, Technician Referring MD:             Norwood Levo. Simpson MD, MD Medicines:                Meperidine 50 mg IV, Midazolam 8 mg IV Complications:            No immediate complications. Estimated Blood Loss:     Estimated blood loss: none. Procedure:                Pre-Anesthesia Assessment:                           - Prior to the procedure, a History and Physical                            was performed, and patient medications and                            allergies were reviewed. The patient's tolerance of                            previous anesthesia was also reviewed. The risks                            and benefits of the procedure and the sedation                            options and risks were discussed with the patient.                            All questions were answered, and informed consent                            was obtained. Prior Anticoagulants: The patient                            last took ibuprofen 1 day prior to the procedure.                            ASA Grade Assessment: II - A patient with mild  systemic disease. After reviewing the risks and                            benefits, the patient was deemed in satisfactory                            condition to undergo the procedure.                           After obtaining informed consent, the colonoscope                            was passed under direct vision. Throughout the   procedure, the patient's blood pressure, pulse, and                            oxygen saturations were monitored continuously. The                            EC-[id number] ([id number]) scope was introduced through                            the anus and advanced to the the cecum, identified                            by appendiceal orifice and ileocecal valve. The                            colonoscopy was performed without difficulty. The                            patient tolerated the procedure well. The quality                            of the bowel preparation was good. The ileocecal                            valve, appendiceal orifice, and rectum were                            photographed. Scope In: 11:19:35 AM Scope Out: 11:31:56 AM Scope Withdrawal Time: 0 hours 7 minutes 2 seconds  Total Procedure Duration: 0 hours 12 minutes 21 seconds  Findings:      The perianal and digital rectal examinations were normal.      A few small-mouthed diverticula were found in the sigmoid colon.      The exam was otherwise without abnormality.      External hemorrhoids were found during retroflexion. The hemorrhoids       were small. Impression:               - Diverticulosis in the sigmoid colon.                           - The examination was otherwise normal.                           -  External hemorrhoids.                           - No specimens collected. Moderate Sedation:      Moderate (conscious) sedation was administered by the endoscopy nurse       and supervised by the endoscopist. The following parameters were       monitored: oxygen saturation, heart rate, blood pressure, CO2       capnography and response to care. Total physician intraservice time was       19 minutes. Recommendation:           - Patient has a contact number available for                            emergencies. The signs and symptoms of potential                            delayed complications were discussed  with the                            patient. Return to normal activities tomorrow.                            Written discharge instructions were provided to the                            patient.                           - High fiber diet today.                           - Continue present medications.                           - Repeat colonoscopy in 5 years for surveillance. Procedure Code(s):        --- Professional ---                           [phone], Colonoscopy, flexible; diagnostic, including                            collection of specimen(s) by brushing or washing,                            when performed (separate procedure)                           99152, Moderate sedation services provided by the                            same physician or other qualified health care                            professional performing the diagnostic or  therapeutic service that the sedation supports,                            requiring the presence of an independent trained                            observer to assist in the monitoring of the                            patient's level of consciousness and physiological                            status; initial 15 minutes of intraservice time,                            patient age 10 years or older Diagnosis Code(s):        --- Professional ---                           Z86.010, Personal history of colonic polyps                           K64.4, Residual hemorrhoidal skin tags                           K57.30, Diverticulosis of large intestine without                            perforation or abscess without bleeding CPT copyright 2016 American Medical Association. All rights reserved. The codes documented in this report are preliminary and upon coder review may  be revised to meet current compliance requirements. Hildred Laser, MD Hildred Laser, MD 06/27/2016 11:37:31 AM This report has been signed  electronically. Number of Addenda: 0

## 2016-06-27 NOTE — Discharge Instructions (Signed)
Resume usual medications and high fiber diet. No driving for 24 hours. Next colonoscopy in 5 years.   Colonoscopy, Adult, Care After This sheet gives you information about how to care for yourself after your procedure. Your doctor may also give you more specific instructions. If you have problems or questions, call your doctor. Follow these instructions at home: General instructions    For the first 24 hours after the procedure:  Do not drive or use machinery.  Do not sign important documents.  Do not drink alcohol.  Do your daily activities more slowly than normal.  Eat foods that are soft and easy to digest.  Rest often.  Take over-the-counter or prescription medicines only as told by your doctor.  It is up to you to get the results of your procedure. Ask your doctor, or the department performing the procedure, when your results will be ready. To help cramping and bloating:   Try walking around.  Put heat on your belly (abdomen) as told by your doctor. Use a heat source that your doctor recommends, such as a moist heat pack or a heating pad.  Put a towel between your skin and the heat source.  Leave the heat on for 20-30 minutes.  Remove the heat if your skin turns bright red. This is especially important if you cannot feel pain, heat, or cold. You can get burned. Eating and drinking   Drink enough fluid to keep your pee (urine) clear or pale yellow.  Return to your normal diet as told by your doctor. Avoid heavy or fried foods that are hard to digest.  Avoid drinking alcohol for as long as told by your doctor. Contact a doctor if:  You have blood in your poop (stool) 2-3 days after the procedure. Get help right away if:  You have more than a small amount of blood in your poop.  You see large clumps of tissue (blood clots) in your poop.  Your belly is swollen.  You feel sick to your stomach (nauseous).  You throw up (vomit).  You have a fever.  You  have belly pain that gets worse, and medicine does not help your pain. This information is not intended to replace advice given to you by your health care provider. Make sure you discuss any questions you have with your health care provider. Document Released: 04/13/2010 Document Revised: 12/04/2015 Document Reviewed: 12/04/2015 Elsevier Interactive Patient Education  2017 Else[employer] Inc.  Diverticulosis Diverticulosis is a condition that develops when small pouches (diverticula) form in the wall of the large intestine (colon). The colon is where water is absorbed and stool is formed. The pouches form when the inside layer of the colon pushes through weak spots in the outer layers of the colon. You may have a few pouches or many of them. What are the causes? The cause of this condition is not known. What increases the risk? The following factors may make you more likely to develop this condition:  Being older than age 20. Your risk for this condition increases with age. Diverticulosis is rare among people younger than age 47. By age 73, many people have it.  Eating a low-fiber diet.  Having frequent constipation.  Being overweight.  Not getting enough exercise.  Smoking.  Taking over-the-counter pain medicines, like aspirin and ibuprofen.  Having a family history of diverticulosis. What are the signs or symptoms? In most people, there are no symptoms of this condition. If you do have symptoms, they may include:  Bloating.  Cramps in the abdomen.  Constipation or diarrhea.  Pain in the lower left side of the abdomen. How is this diagnosed? This condition is most often diagnosed during an exam for other colon problems. Because diverticulosis usually has no symptoms, it often cannot be diagnosed independently. This condition may be diagnosed by:  Using a flexible scope to examine the colon (colonoscopy).  Taking an X-ray of the colon after dye has been put into the colon (barium  enema).  Doing a CT scan. How is this treated? You may not need treatment for this condition if you have never developed an infection related to diverticulosis. If you have had an infection before, treatment may include:  Eating a high-fiber diet. This may include eating more fruits, vegetables, and grains.  Taking a fiber supplement.  Taking a live bacteria supplement (probiotic).  Taking medicine to relax your colon.  Taking antibiotic medicines. Follow these instructions at home:  Drink 6-8 glasses of water or more each day to prevent constipation.  Try not to strain when you have a bowel movement.  If you have had an infection before:  Eat more fiber as directed by your health care provider or your diet and nutrition specialist (dietitian).  Take a fiber supplement or probiotic, if your health care provider approves.  Take over-the-counter and prescription medicines only as told by your health care provider.  If you were prescribed an antibiotic, take it as told by your health care provider. Do not stop taking the antibiotic even if you start to feel better.  Keep all follow-up visits as told by your health care provider. This is important. Contact a health care provider if:  You have pain in your abdomen.  You have bloating.  You have cramps.  You have not had a bowel movement in 3 days. Get help right away if:  Your pain gets worse.  Your bloating becomes very bad.  You have a fever or chills, and your symptoms suddenly get worse.  You vomit.  You have bowel movements that are bloody or black.  You have bleeding from your rectum. Summary  Diverticulosis is a condition that develops when small pouches (diverticula) form in the wall of the large intestine (colon).  You may have a few pouches or many of them.  This condition is most often diagnosed during an exam for other colon problems.  If you have had an infection related to diverticulosis,  treatment may include increasing the fiber in your diet, taking supplements, or taking medicines. This information is not intended to replace advice given to you by your health care provider. Make sure you discuss any questions you have with your health care provider. Document Released: 12/07/2003 Document Revised: 01/29/2016 Document Reviewed: 01/29/2016 Elsevier Interactive Patient Education  2017 [place].  Hemorrhoids Hemorrhoids are swollen veins in and around the rectum or anus. There are two types of hemorrhoids:  Internal hemorrhoids. These occur in the veins that are just inside the rectum. They may poke through to the outside and become irritated and painful.  External hemorrhoids. These occur in the veins that are outside of the anus and can be felt as a painful swelling or hard lump near the anus. Most hemorrhoids do not cause serious problems, and they can be managed with home treatments such as diet and lifestyle changes. If home treatments do not help your symptoms, procedures can be done to shrink or remove the hemorrhoids. What are the causes? This condition is  caused by increased pressure in the anal area. This pressure may result from various things, including:  Constipation.  Straining to have a bowel movement.  Diarrhea.  Pregnancy.  Obesity.  Sitting for long periods of time.  Heavy lifting or other activity that causes you to strain.  Anal sex. What are the signs or symptoms? Symptoms of this condition include:  Pain.  Anal itching or irritation.  Rectal bleeding.  Leakage of stool (feces).  Anal swelling.  One or more lumps around the anus. How is this diagnosed? This condition can often be diagnosed through a visual exam. Other exams or tests may also be done, such as:  Examination of the rectal area with a gloved hand (digital rectal exam).  Examination of the anal canal using a small tube (anoscope).  A blood test, if you have lost a  significant amount of blood.  A test to look inside the colon (sigmoidoscopy or colonoscopy). How is this treated? This condition can usually be treated at home. However, various procedures may be done if dietary changes, lifestyle changes, and other home treatments do not help your symptoms. These procedures can help make the hemorrhoids smaller or remove them completely. Some of these procedures involve surgery, and others do not. Common procedures include:  Rubber band ligation. Rubber bands are placed at the base of the hemorrhoids to cut off the blood supply to them.  Sclerotherapy. Medicine is injected into the hemorrhoids to shrink them.  Infrared coagulation. A type of light energy is used to get rid of the hemorrhoids.  Hemorrhoidectomy surgery. The hemorrhoids are surgically removed, and the veins that supply them are tied off.  Stapled hemorrhoidopexy surgery. A circular stapling device is used to remove the hemorrhoids and use staples to cut off the blood supply to them. Follow these instructions at home: Eating and drinking   Eat foods that have a lot of fiber in them, such as whole grains, beans, nuts, fruits, and vegetables. Ask your health care provider about taking products that have added fiber (fiber supplements).  Drink enough fluid to keep your urine clear or pale yellow. Managing pain and swelling   Take warm sitz baths for 20 minutes, 3-4 times a day to ease pain and discomfort.  If directed, apply ice to the affected area. Using ice packs between sitz baths may be helpful.  Put ice in a plastic bag.  Place a towel between your skin and the bag.  Leave the ice on for 20 minutes, 2-3 times a day. General instructions   Take over-the-counter and prescription medicines only as told by your health care provider.  Use medicated creams or suppositories as told.  Exercise regularly.  Go to the bathroom when you have the urge to have a bowel movement. Do not  wait.  Avoid straining to have bowel movements.  Keep the anal area dry and clean. Use wet toilet paper or moist towelettes after a bowel movement.  Do not sit on the toilet for long periods of time. This increases blood pooling and pain. Contact a health care provider if:  You have increasing pain and swelling that are not controlled by treatment or medicine.  You have uncontrolled bleeding.  You have difficulty having a bowel movement, or you are unable to have a bowel movement.  You have pain or inflammation outside the area of the hemorrhoids. This information is not intended to replace advice given to you by your health care provider. Make sure you discuss  any questions you have with your health care provider. Document Released: 03/08/2000 Document Revised: 08/09/2015 Document Reviewed: 11/23/2014 Elsevier Interactive Patient Education  2017 [place].  High-Fiber Diet Fiber, also called dietary fiber, is a type of carbohydrate found in fruits, vegetables, whole grains, and beans. A high-fiber diet can have many health benefits. Your health care provider may recommend a high-fiber diet to help:  Prevent constipation. Fiber can make your bowel movements more regular.  Lower your cholesterol.  Relieve hemorrhoids, uncomplicated diverticulosis, or irritable bowel syndrome.  Prevent overeating as part of a weight-loss plan.  Prevent heart disease, type 2 diabetes, and certain cancers. What is my plan? The recommended daily intake of fiber includes:  38 grams for men under age 29.  69 grams for men over age 25.  59 grams for women under age 33.  38 grams for women over age 64. You can get the recommended daily intake of dietary fiber by eating a variety of fruits, vegetables, grains, and beans. Your health care provider may also recommend a fiber supplement if it is not possible to get enough fiber through your diet. What do I need to know about a high-fiber  diet?  Fiber supplements have not been widely studied for their effectiveness, so it is better to get fiber through food sources.  Always check the fiber content on thenutrition facts label of any prepackaged food. Look for foods that contain at least 5 grams of fiber per serving.  Ask your dietitian if you have questions about specific foods that are related to your condition, especially if those foods are not listed in the following section.  Increase your daily fiber consumption gradually. Increasing your intake of dietary fiber too quickly may cause bloating, cramping, or gas.  Drink plenty of water. Water helps you to digest fiber. What foods can I eat? Grains  Whole-grain breads. Multigrain cereal. Oats and oatmeal. Brown rice. Barley. Bulgur wheat. [place]. Bran muffins. Popcorn. Rye wafer crackers. Vegetables  Sweet potatoes. Spinach. Kale. Artichokes. Cabbage. Broccoli. Green peas. Carrots. Squash. Fruits  Berries. Pears. Apples. Oranges. Avocados. Prunes and raisins. Dried figs. Meats and Other Protein Sources  [place], kidney, pinto, and soy beans. Split peas. Lentils. Nuts and seeds. Dairy  Fiber-fortified yogurt. Beverages  Fiber-fortified soy milk. Fiber-fortified orange juice. Other  Fiber bars. The items listed above may not be a complete list of recommended foods or beverages. Contact your dietitian for more options.  What foods are not recommended? Grains  White bread. Pasta made with refined flour. White rice. Vegetables  Fried potatoes. Canned vegetables. Well-cooked vegetables. Fruits  Fruit juice. Cooked, strained fruit. Meats and Other Protein Sources  Fatty cuts of meat. Fried Sales executive or fried fish. Dairy  Milk. Yogurt. Cream cheese. Sour cream. Beverages  Soft drinks. Other  Cakes and pastries. Butter and oils. The items listed above may not be a complete list of foods and beverages to avoid. Contact your dietitian for more information.  What are some  tips for including high-fiber foods in my diet?  Eat a wide variety of high-fiber foods.  Make sure that half of all grains consumed each day are whole grains.  Replace breads and cereals made from refined flour or white flour with whole-grain breads and cereals.  Replace white rice with brown rice, bulgur wheat, or millet.  Start the day with a breakfast that is high in fiber, such as a cereal that contains at least 5 grams of fiber per serving.  Use beans  in place of meat in soups, salads, or pasta.  Eat high-fiber snacks, such as berries, raw vegetables, nuts, or popcorn. This information is not intended to replace advice given to you by your health care provider. Make sure you discuss any questions you have with your health care provider. Document Released: 03/11/2005 Document Revised: 08/17/2015 Document Reviewed: 08/24/2013 Elsevier Interactive Patient Education  2017 [employer].

## 2016-07-01 ENCOUNTER — Encounter (HOSPITAL_COMMUNITY): Payer: Self-pay | Admitting: Internal Medicine

## 2016-07-24 ENCOUNTER — Other Ambulatory Visit: Payer: Self-pay | Admitting: Family Medicine

## 2016-07-30 DIAGNOSIS — F3175 Bipolar disorder, in partial remission, most recent episode depressed: Secondary | ICD-10-CM | POA: Diagnosis not present

## 2016-08-08 DIAGNOSIS — R7301 Impaired fasting glucose: Secondary | ICD-10-CM | POA: Diagnosis not present

## 2016-08-08 DIAGNOSIS — I10 Essential (primary) hypertension: Secondary | ICD-10-CM | POA: Diagnosis not present

## 2016-08-08 DIAGNOSIS — E785 Hyperlipidemia, unspecified: Secondary | ICD-10-CM | POA: Diagnosis not present

## 2016-08-08 LAB — CBC
HCT: 40.4 % (ref 35.0–45.0)
Hemoglobin: 13.2 g/dL (ref 11.7–15.5)
MCH: 29.8 pg (ref 27.0–33.0)
MCHC: 32.7 g/dL (ref 32.0–36.0)
MCV: 91.2 fL (ref 80.0–100.0)
MPV: 10.3 fL (ref 7.5–12.5)
PLATELETS: 323 10*3/uL (ref 140–400)
RBC: 4.43 MIL/uL (ref 3.80–5.10)
RDW: 15.2 % — AB (ref 11.0–15.0)
WBC: 7.6 10*3/uL (ref 3.8–10.8)

## 2016-08-09 LAB — LIPID PANEL
CHOLESTEROL: 198 mg/dL (ref <200)
HDL: 69 mg/dL (ref >50)
LDL Cholesterol: 110 mg/dL — ABNORMAL HIGH (ref <100)
TRIGLYCERIDES: 93 mg/dL (ref <150)
Total CHOL/HDL Ratio: 2.9 Ratio (ref <5.0)
VLDL: 19 mg/dL (ref <30)

## 2016-08-09 LAB — COMPLETE METABOLIC PANEL WITH GFR
ALK PHOS: 73 U/L (ref 33–130)
ALT: 12 U/L (ref 6–29)
AST: 15 U/L (ref 10–35)
Albumin: 4.3 g/dL (ref 3.6–5.1)
BUN: 12 mg/dL (ref 7–25)
CALCIUM: 9.7 mg/dL (ref 8.6–10.4)
CO2: 24 mmol/L (ref 20–31)
Chloride: 103 mmol/L (ref 98–110)
Creat: 1.02 mg/dL — ABNORMAL HIGH (ref 0.50–0.99)
GFR, Est African American: 69 mL/min (ref >=60)
GFR, Est Non African American: 60 mL/min (ref >=60)
Glucose, Bld: 94 mg/dL (ref 65–99)
Potassium: 3.8 mmol/L (ref 3.5–5.3)
Sodium: 140 mmol/L (ref 135–146)
TOTAL PROTEIN: 7.6 g/dL (ref 6.1–8.1)
Total Bilirubin: 0.3 mg/dL (ref 0.2–1.2)

## 2016-08-09 LAB — HEMOGLOBIN A1C
Hgb A1c MFr Bld: 5.9 % — ABNORMAL HIGH (ref <5.7)
Mean Plasma Glucose: 123 mg/dL

## 2016-08-09 LAB — TSH: TSH: 1.96 mIU/L

## 2016-08-22 ENCOUNTER — Other Ambulatory Visit: Payer: Self-pay | Admitting: Family Medicine

## 2016-09-17 ENCOUNTER — Other Ambulatory Visit: Payer: Self-pay | Admitting: Family Medicine

## 2016-09-17 DIAGNOSIS — Z1231 Encounter for screening mammogram for malignant neoplasm of breast: Secondary | ICD-10-CM

## 2016-09-19 ENCOUNTER — Ambulatory Visit (HOSPITAL_COMMUNITY)
Admission: RE | Admit: 2016-09-19 | Discharge: 2016-09-19 | Disposition: A | Discharge status: Home / Self Care | Payer: Medicare HMO | Source: Ambulatory Visit | Attending: Family Medicine | Admitting: Family Medicine

## 2016-09-19 DIAGNOSIS — Z1231 Encounter for screening mammogram for malignant neoplasm of breast: Secondary | ICD-10-CM

## 2016-09-23 ENCOUNTER — Other Ambulatory Visit: Payer: Self-pay | Admitting: Family Medicine

## 2016-10-24 ENCOUNTER — Other Ambulatory Visit: Payer: Self-pay | Admitting: Family Medicine

## 2016-10-25 ENCOUNTER — Other Ambulatory Visit: Payer: Self-pay | Admitting: Family Medicine

## 2016-11-05 ENCOUNTER — Other Ambulatory Visit: Payer: Self-pay | Admitting: Family Medicine

## 2016-11-21 ENCOUNTER — Other Ambulatory Visit: Payer: Self-pay | Admitting: Family Medicine

## 2016-12-24 ENCOUNTER — Other Ambulatory Visit: Payer: Self-pay | Admitting: Family Medicine

## 2017-01-14 DIAGNOSIS — F3175 Bipolar disorder, in partial remission, most recent episode depressed: Secondary | ICD-10-CM | POA: Diagnosis not present

## 2017-01-24 ENCOUNTER — Other Ambulatory Visit: Payer: Self-pay | Admitting: Family Medicine

## 2017-01-29 ENCOUNTER — Telehealth: Payer: Self-pay

## 2017-01-29 DIAGNOSIS — E038 Other specified hypothyroidism: Secondary | ICD-10-CM

## 2017-01-29 DIAGNOSIS — I10 Essential (primary) hypertension: Secondary | ICD-10-CM | POA: Diagnosis not present

## 2017-01-29 NOTE — Telephone Encounter (Signed)
Labs ordered.

## 2017-01-30 LAB — COMPLETE METABOLIC PANEL WITH GFR
AG RATIO: 1.3 (calc) (ref 1.0–2.5)
ALKALINE PHOSPHATASE (APISO): 65 U/L (ref 33–130)
ALT: 12 U/L (ref 6–29)
AST: 16 U/L (ref 10–35)
Albumin: 4.3 g/dL (ref 3.6–5.1)
BILIRUBIN TOTAL: 0.3 mg/dL (ref 0.2–1.2)
BUN/Creatinine Ratio: 13 (calc) (ref 6–22)
BUN: 15 mg/dL (ref 7–25)
CHLORIDE: 101 mmol/L (ref 98–110)
CO2: 27 mmol/L (ref 20–32)
Calcium: 9.8 mg/dL (ref 8.6–10.4)
Creat: 1.14 mg/dL — ABNORMAL HIGH (ref 0.50–0.99)
GFR, EST NON AFRICAN AMERICAN: 52 mL/min/{1.73_m2} — AB (ref >=60)
GFR, Est African American: 61 mL/min/{1.73_m2} (ref >=60)
GLOBULIN: 3.3 g/dL (ref 1.9–3.7)
Glucose, Bld: 91 mg/dL (ref 65–99)
POTASSIUM: 3.6 mmol/L (ref 3.5–5.3)
SODIUM: 138 mmol/L (ref 135–146)
Total Protein: 7.6 g/dL (ref 6.1–8.1)

## 2017-01-30 LAB — CBC
HEMATOCRIT: 39.3 % (ref 35.0–45.0)
Hemoglobin: 12.9 g/dL (ref 11.7–15.5)
MCH: 29.1 pg (ref 27.0–33.0)
MCHC: 32.8 g/dL (ref 32.0–36.0)
MCV: 88.7 fL (ref 80.0–100.0)
MPV: 10.8 fL (ref 7.5–12.5)
PLATELETS: 376 10*3/uL (ref 140–400)
RBC: 4.43 10*6/uL (ref 3.80–5.10)
RDW: 14.1 % (ref 11.0–15.0)
WBC: 8 10*3/uL (ref 3.8–10.8)

## 2017-01-30 LAB — LIPID PANEL
CHOLESTEROL: 212 mg/dL — AB (ref <200)
HDL: 82 mg/dL (ref >50)
LDL Cholesterol (Calc): 110 mg/dL (calc) — ABNORMAL HIGH
NON-HDL CHOLESTEROL (CALC): 130 mg/dL — AB (ref <130)
TRIGLYCERIDES: 95 mg/dL (ref <150)
Total CHOL/HDL Ratio: 2.6 (calc) (ref <5.0)

## 2017-01-30 LAB — HEMOGLOBIN A1C
HEMOGLOBIN A1C: 5.6 %{Hb} (ref <5.7)
MEAN PLASMA GLUCOSE: 114 (calc)
eAG (mmol/L): 6.3 (calc)

## 2017-01-30 LAB — TSH: TSH: 1.99 mIU/L (ref 0.40–4.50)

## 2017-02-19 ENCOUNTER — Encounter: Payer: Self-pay | Admitting: Family Medicine

## 2017-02-19 ENCOUNTER — Ambulatory Visit (INDEPENDENT_AMBULATORY_CARE_PROVIDER_SITE_OTHER): Payer: Medicare HMO | Admitting: Family Medicine

## 2017-02-19 VITALS — BP 114/80 | HR 96 | Resp 16 | Ht 63.0 in | Wt 200.0 lb

## 2017-02-19 DIAGNOSIS — Z0000 Encounter for general adult medical examination without abnormal findings: Secondary | ICD-10-CM | POA: Diagnosis not present

## 2017-02-19 DIAGNOSIS — Z23 Encounter for immunization: Secondary | ICD-10-CM

## 2017-02-19 DIAGNOSIS — G8929 Other chronic pain: Secondary | ICD-10-CM | POA: Insufficient documentation

## 2017-02-19 DIAGNOSIS — Z1211 Encounter for screening for malignant neoplasm of colon: Secondary | ICD-10-CM

## 2017-02-19 DIAGNOSIS — M25511 Pain in right shoulder: Secondary | ICD-10-CM | POA: Diagnosis not present

## 2017-02-19 LAB — POC HEMOCCULT BLD/STL (OFFICE/1-CARD/DIAGNOSTIC): FECAL OCCULT BLD: NEGATIVE

## 2017-02-19 MED ORDER — PREDNISONE 5 MG (21) PO TBPK: 5.0000 mg | ORAL_TABLET | ORAL | 0 refills | Status: DC

## 2017-02-19 MED ORDER — IBUPROFEN 800 MG PO TABS: 800.0000 mg | ORAL_TABLET | Freq: Three times a day (TID) | ORAL | 0 refills | Status: DC

## 2017-02-19 NOTE — Assessment & Plan Note (Addendum)
Reduced mobility and pain of right shoulder x 3 months. Prednisone 5 mg dose pack and 1 week course of ibuprofen prescribed

## 2017-02-19 NOTE — Patient Instructions (Addendum)
Wellness visit with nurse in 5  Months.  Fasting lipid, cmp and eGFr and  tSH in 5.5 months  Pls cut back on fried and fatty foods, and change to fruit and  Vegetables as snacks, reduce chips , cookies and chocolate  Fl;u vaccine today  Annual physical exam with mD in 1 year  Two medications are prescribed for right shoulder pain, if this continues call for referral to orthopedics  Please work on good  health habits so that your health will improve. 1. Commitment to daily physical activity for 30 to 60  minutes, if you are able to do this.  2. Commitment to wise food choices. Aim for half of your  food intake to be vegetable and fruit, one quarter starchy foods, and one quarter protein. Try to eat on a regular schedule  3 meals per day, snacking between meals should be limited to vegetables or fruits or small portions of nuts. 64 ounces of water per day is generally recommended, unless you have specific health conditions, like heart failure or kidney failure where you will need to limit fluid intake.  3. Commitment to sufficient and a  good quality of physical and mental rest daily, generally between 6 to 8 hours per day.  WITH PERSISTANCE AND PERSEVERANCE, THE IMPOSSIBLE , BECOMES THE NORM! .exq1  

## 2017-02-19 NOTE — Assessment & Plan Note (Signed)

## 2017-02-19 NOTE — Progress Notes (Signed)
    Rebecca Rodgers     MRN: [id number]      DOB: [birth year]  HPI: Patient is in for annual physical exam. Right shoulder pain with reduced mobility x 3 months, has had to do increased lifting over this time  Recent labs, are reviewed. Immunization is reviewed , and  updated    PE: BP 114/80   Pulse 96   Resp 16   Ht 5\' 3"  (1.6 m)   Wt 200 lb (90.7 kg)   SpO2 97%   BMI 35.43 kg/m   Pleasant  female, alert and oriented x 3, in no cardio-pulmonary distress. Afebrile. HEENT No facial trauma or asymetry. Sinuses non tender.  Extra occullar muscles intact,. External ears normal, . Oropharynx moist, no exudate. Neck: supple, no adenopathy,JVD or thyromegaly.No bruits.  Chest: Clear to ascultation bilaterally.No crackles or wheezes. Non tender to palpation  Breast: No asymetry,no masses or lumps. No tenderness. No nipple discharge or inversion. No axillary or supraclavicular adenopathy  Cardiovascular system; Heart sounds normal,  S1 and  S2 ,no S3.  No murmur, or thrill. Apical beat not displaced Peripheral pulses normal.  Abdomen: Soft, non tender, no organomegaly or masses. No bruits. Bowel sounds normal. No guarding, tenderness or rebound.  Rectal:  Normal sphincter tone. No rectal mass. Guaiac negative stool.  GU: Not examined  Musculoskeletal exam: Full ROM of spine, hips , and knees.Decreased ROM right shoulder No deformity ,swelling or crepitus noted. No muscle wasting or atrophy.   Neurologic: Cranial nerves 2 to 12 intact. Power, tone ,sensation and reflexes normal throughout. No disturbance in gait. No tremor.  Skin: Intact, no ulceration, erythema , scaling or rash noted. Pigmentation normal throughout  Psych; Normal mood and affect. Judgement and concentration normal   Assessment & Plan:  Annual physical exam Annual exam as documented. Counseling done  re healthy lifestyle involving commitment to 150 minutes exercise per week, heart  healthy diet, and attaining healthy weight.The importance of adequate sleep also discussed. Regular seat belt use and home safety, is also discussed. Changes in health habits are decided on by the patient with goals and time frames  set for achieving them. Immunization and cancer screening needs are specifically addressed at this visit.   Chronic right shoulder pain Reduced mobility and pain of right shoulder x 3 months. Prednisone 5 mg dose pack and 1 week course of ibuprofen prescribed

## 2017-02-24 ENCOUNTER — Other Ambulatory Visit: Payer: Self-pay | Admitting: Family Medicine

## 2017-02-24 ENCOUNTER — Telehealth: Payer: Self-pay | Admitting: Family Medicine

## 2017-02-24 NOTE — Telephone Encounter (Signed)
noted 

## 2017-02-24 NOTE — Telephone Encounter (Signed)
Seen 11 28 18 

## 2017-02-24 NOTE — Telephone Encounter (Signed)
Patient left a message Friday 02/21/17 that prednisone was working for her.

## 2017-02-25 ENCOUNTER — Encounter: Payer: Self-pay | Admitting: Family Medicine

## 2017-03-20 ENCOUNTER — Other Ambulatory Visit: Payer: Self-pay | Admitting: Family Medicine

## 2017-03-27 ENCOUNTER — Telehealth: Payer: Self-pay | Admitting: Family Medicine

## 2017-03-27 ENCOUNTER — Other Ambulatory Visit: Payer: Self-pay

## 2017-03-27 MED ORDER — IBUPROFEN 800 MG PO TABS: ORAL_TABLET | ORAL | 0 refills | Status: DC

## 2017-03-27 NOTE — Telephone Encounter (Signed)
Sent for max of 3 per week

## 2017-03-27 NOTE — Telephone Encounter (Signed)
Please send   ibuprofen (ADVIL,MOTRIN) 800 MG tablet   To the pharmacy

## 2017-04-25 ENCOUNTER — Other Ambulatory Visit: Payer: Self-pay | Admitting: Family Medicine

## 2017-05-12 ENCOUNTER — Other Ambulatory Visit: Payer: Self-pay | Admitting: Family Medicine

## 2017-05-22 ENCOUNTER — Other Ambulatory Visit: Payer: Self-pay | Admitting: Family Medicine

## 2017-05-26 ENCOUNTER — Other Ambulatory Visit: Payer: Self-pay | Admitting: Family Medicine

## 2017-06-06 ENCOUNTER — Telehealth: Payer: Self-pay | Admitting: Family Medicine

## 2017-06-06 DIAGNOSIS — I10 Essential (primary) hypertension: Secondary | ICD-10-CM

## 2017-06-06 DIAGNOSIS — E782 Mixed hyperlipidemia: Secondary | ICD-10-CM

## 2017-06-06 DIAGNOSIS — E8881 Metabolic syndrome: Secondary | ICD-10-CM

## 2017-06-06 DIAGNOSIS — E039 Hypothyroidism, unspecified: Secondary | ICD-10-CM

## 2017-06-06 MED ORDER — EZETIMIBE 10 MG PO TABS: 10.0000 mg | ORAL_TABLET | Freq: Every day | ORAL | 5 refills | Status: DC

## 2017-06-06 NOTE — Telephone Encounter (Signed)
I have several medication recommendations for pt sent to me. Explain that since her LDL is elevated , needs to reduce fat and I am adding zetia 10 mg to crestor 40 mg. Explain that the Singulair daily is for mild asthma/ copd and allergies, ask if she feels she needs this , she has not filled script since 2018 Ask if needs proventil inhaler, l will await response on that Thanks Am sending in zetia pls let her know and order fasting lipid to include LDL, cmp and eGFR and HBA1C and TSH for her May visit  ?? pls ask 

## 2017-06-20 ENCOUNTER — Other Ambulatory Visit: Payer: Self-pay | Admitting: Family Medicine

## 2017-06-20 MED ORDER — EZETIMIBE 10 MG PO TABS: 10.0000 mg | ORAL_TABLET | Freq: Every day | ORAL | 5 refills | Status: DC

## 2017-06-20 MED ORDER — MONTELUKAST SODIUM 10 MG PO TABS: 10.0000 mg | ORAL_TABLET | Freq: Every day | ORAL | 5 refills | Status: DC

## 2017-06-20 MED ORDER — ALBUTEROL SULFATE HFA 108 (90 BASE) MCG/ACT IN AERS
2.0000 | INHALATION_SPRAY | Freq: Four times a day (QID) | RESPIRATORY_TRACT | 0 refills | Status: DC | PRN
Start: 2017-06-20 — End: 2021-07-16

## 2017-06-20 NOTE — Addendum Note (Signed)
Addended by: Eual Fines on: 06/20/2017 09:13 AM   Modules accepted: Orders

## 2017-06-20 NOTE — Telephone Encounter (Signed)
proventil is sent

## 2017-06-20 NOTE — Telephone Encounter (Signed)
Patient aware. meds sent except inhaler. Not on her list. Please send

## 2017-06-24 ENCOUNTER — Other Ambulatory Visit: Payer: Self-pay | Admitting: Family Medicine

## 2017-06-27 DIAGNOSIS — H5200 Hypermetropia, unspecified eye: Secondary | ICD-10-CM | POA: Diagnosis not present

## 2017-06-30 DIAGNOSIS — Z0100 Encounter for examination of eyes and vision without abnormal findings: Secondary | ICD-10-CM | POA: Diagnosis not present

## 2017-07-01 DIAGNOSIS — F3175 Bipolar disorder, in partial remission, most recent episode depressed: Secondary | ICD-10-CM | POA: Diagnosis not present

## 2017-07-02 DIAGNOSIS — I10 Essential (primary) hypertension: Secondary | ICD-10-CM | POA: Diagnosis not present

## 2017-07-02 DIAGNOSIS — E8881 Metabolic syndrome: Secondary | ICD-10-CM | POA: Diagnosis not present

## 2017-07-02 DIAGNOSIS — E782 Mixed hyperlipidemia: Secondary | ICD-10-CM | POA: Diagnosis not present

## 2017-07-03 LAB — COMPLETE METABOLIC PANEL WITH GFR
AG RATIO: 1.7 (calc) (ref 1.0–2.5)
ALBUMIN MSPROF: 4.7 g/dL (ref 3.6–5.1)
ALT: 14 U/L (ref 6–29)
AST: 24 U/L (ref 10–35)
Alkaline phosphatase (APISO): 70 U/L (ref 33–130)
BUN / CREAT RATIO: 12 (calc) (ref 6–22)
BUN: 16 mg/dL (ref 7–25)
CALCIUM: 10.1 mg/dL (ref 8.6–10.4)
CO2: 26 mmol/L (ref 20–32)
CREATININE: 1.36 mg/dL — AB (ref 0.50–0.99)
Chloride: 103 mmol/L (ref 98–110)
GFR, EST AFRICAN AMERICAN: 49 mL/min/{1.73_m2} — AB (ref >=60)
GFR, Est Non African American: 42 mL/min/{1.73_m2} — ABNORMAL LOW (ref >=60)
GLOBULIN: 2.7 g/dL (ref 1.9–3.7)
Glucose, Bld: 89 mg/dL (ref 65–99)
POTASSIUM: 3.8 mmol/L (ref 3.5–5.3)
SODIUM: 140 mmol/L (ref 135–146)
TOTAL PROTEIN: 7.4 g/dL (ref 6.1–8.1)
Total Bilirubin: 0.3 mg/dL (ref 0.2–1.2)

## 2017-07-03 LAB — LIPID PANEL
Cholesterol: 171 mg/dL (ref <200)
HDL: 59 mg/dL (ref >50)
LDL Cholesterol (Calc): 95 mg/dL (calc)
NON-HDL CHOLESTEROL (CALC): 112 mg/dL (ref <130)
Total CHOL/HDL Ratio: 2.9 (calc) (ref <5.0)
Triglycerides: 83 mg/dL (ref <150)

## 2017-07-03 LAB — HEMOGLOBIN A1C
EAG (MMOL/L): 6.6 (calc)
HEMOGLOBIN A1C: 5.8 %{Hb} — AB (ref <5.7)
Mean Plasma Glucose: 120 (calc)

## 2017-07-03 LAB — TSH: TSH: 1.92 m[IU]/L (ref 0.40–4.50)

## 2017-07-10 NOTE — Telephone Encounter (Signed)
Sch for Monday 4-22

## 2017-07-14 ENCOUNTER — Ambulatory Visit (INDEPENDENT_AMBULATORY_CARE_PROVIDER_SITE_OTHER): Payer: Medicare HMO | Admitting: Family Medicine

## 2017-07-14 ENCOUNTER — Encounter: Payer: Self-pay | Admitting: Family Medicine

## 2017-07-14 VITALS — BP 130/80 | HR 98 | Resp 16 | Ht 63.0 in | Wt 181.0 lb

## 2017-07-14 DIAGNOSIS — J309 Allergic rhinitis, unspecified: Secondary | ICD-10-CM

## 2017-07-14 DIAGNOSIS — E6609 Other obesity due to excess calories: Secondary | ICD-10-CM

## 2017-07-14 DIAGNOSIS — F418 Other specified anxiety disorders: Secondary | ICD-10-CM | POA: Diagnosis not present

## 2017-07-14 DIAGNOSIS — Z1231 Encounter for screening mammogram for malignant neoplasm of breast: Secondary | ICD-10-CM | POA: Diagnosis not present

## 2017-07-14 DIAGNOSIS — E785 Hyperlipidemia, unspecified: Secondary | ICD-10-CM | POA: Diagnosis not present

## 2017-07-14 DIAGNOSIS — E038 Other specified hypothyroidism: Secondary | ICD-10-CM | POA: Diagnosis not present

## 2017-07-14 DIAGNOSIS — R7302 Impaired glucose tolerance (oral): Secondary | ICD-10-CM | POA: Diagnosis not present

## 2017-07-14 DIAGNOSIS — Z6832 Body mass index (BMI) 32.0-32.9, adult: Secondary | ICD-10-CM

## 2017-07-14 DIAGNOSIS — I10 Essential (primary) hypertension: Secondary | ICD-10-CM | POA: Diagnosis not present

## 2017-07-14 MED ORDER — MONTELUKAST SODIUM 10 MG PO TABS: 10.0000 mg | ORAL_TABLET | Freq: Every day | ORAL | 3 refills | Status: DC

## 2017-07-14 MED ORDER — EZETIMIBE 10 MG PO TABS: 10.0000 mg | ORAL_TABLET | Freq: Every day | ORAL | 3 refills | Status: DC

## 2017-07-14 NOTE — Patient Instructions (Addendum)
Please schedule mammogram June 29 or after at checkout  Keep physical appt in December as before  My sincere condolence on you recent loss Non fasting chem 7 and EGFr in early Septemeber    NO MORE ice cream also ONLY tylenol for pain, also peanut butter cookies  Blood sugar is up and kidney function has to be WATCHED  Call for December lab s due in December in the month of Novemeber

## 2017-07-21 ENCOUNTER — Encounter: Payer: Self-pay | Admitting: Family Medicine

## 2017-07-21 DIAGNOSIS — R7302 Impaired glucose tolerance (oral): Secondary | ICD-10-CM | POA: Insufficient documentation

## 2017-07-21 NOTE — Assessment & Plan Note (Signed)
Improved Patient re-educated about  the importance of commitment to a  minimum of 150 minutes of exercise per week.  The importance of healthy food choices with portion control discussed. Encouraged to start a food diary, count calories and to consider  joining a support group. Sample diet sheets offered. Goals set by the patient for the next several months.   Weight /BMI 07/14/2017 02/19/2017 06/26/2016  WEIGHT 181 lb 200 lb 198 lb 0.6 oz  HEIGHT 5\' 3"  5\' 3"  5\' 3"   BMI 32.06 kg/m2 35.43 kg/m2 35.08 kg/m2  Some encounter information is confidential and restricted. Go to Review Flowsheets activity to see all data.

## 2017-07-21 NOTE — Assessment & Plan Note (Signed)
Controlled, no change in medication DASH diet and commitment to daily physical activity for a minimum of 30 minutes discussed and encouraged, as a part of hypertension management. The importance of attaining a healthy weight is also discussed.  BP/Weight 07/14/2017 02/19/2017 06/27/2016 06/26/2016 05/21/2016 03/20/2016 27/61/8485  Systolic BP 927 639 432 003 794 446 190  Diastolic BP 80 80 70 68 74 78 74  Wt. (Lbs) 181 200 - 198.04 198.12 200 200  BMI 32.06 35.43 - 35.08 35.1 35.43 35.43  Some encounter information is confidential and restricted. Go to Review Flowsheets activity to see all data.

## 2017-07-21 NOTE — Assessment & Plan Note (Signed)
Patient educated about the importance of limiting  Carbohydrate intake , the need to commit to daily physical activity for a minimum of 30 minutes , and to commit weight loss. The fact that changes in all these areas will reduce or eliminate all together the development of diabetes is stressed.   Diabetic Labs Latest Ref Rng & Units 07/02/2017 01/29/2017 08/08/2016 01/04/2016 07/20/2015  HbA1c <5.7 % of total Hgb 5.8(H) 5.6 5.9(H) 5.7(H) 5.6  Chol <200 mg/dL 171 212(H) 198 190 169  HDL >50 mg/dL 59 82 69 70 63  Calc LDL mg/dL (calc) 95 110(H) 110(H) 100 91  Triglycerides <150 mg/dL 83 95 93 99 76  Creatinine 0.50 - 0.99 mg/dL 1.36(H) 1.14(H) 1.02(H) 0.95 1.02   BP/Weight 07/14/2017 02/19/2017 06/27/2016 06/26/2016 05/21/2016 03/20/2016 66/08/43  Systolic BP 997 741 423 953 202 334 356  Diastolic BP 80 80 70 68 74 78 74  Wt. (Lbs) 181 200 - 198.04 198.12 200 200  BMI 32.06 35.43 - 35.08 35.1 35.43 35.43  Some encounter information is confidential and restricted. Go to Review Flowsheets activity to see all data.   No flowsheet data found.

## 2017-07-21 NOTE — Assessment & Plan Note (Signed)
Hyperlipidemia:Low fat diet discussed and encouraged.   Lipid Panel  Lab Results  Component Value Date   CHOL 171 07/02/2017   HDL 59 07/02/2017   LDLCALC 95 07/02/2017   TRIG 83 07/02/2017   CHOLHDL 2.9 07/02/2017   Controlled, no change in medication

## 2017-07-21 NOTE — Progress Notes (Signed)
Rebecca Rodgers     MRN: [id number]      DOB: February 21, 1956   HPI Ms. Rudy is here for follow up and re-evaluation of chronic medical conditions, medication management and review of any available recent lab and radiology data.  Preventive health is updated, specifically  Cancer screening and Immunization.   Lost her partner in December 2018 and has been dealing with this, she has excellent family support and is therefore coping as best as she can as well as mental health support. The PT denies any adverse reactions to current medications since the last visit.  There are no new concerns.  There are no specific complaints   ROS Denies recent fever or chills. Denies sinus pressure, nasal congestion, ear pain or sore throat. Denies chest congestion, productive cough or wheezing. Denies chest pains, palpitations and leg swelling Denies abdominal pain, nausea, vomiting,diarrhea or constipation.   Denies dysuria, frequency, hesitancy or incontinence. Denies joint pain, swelling and limitation in mobility. Denies headaches, seizures, numbness, or tingling. C/o increased depression, anxiety and  insomnia.Not suicidal or homicidal Denies skin break down or rash.   PE  BP 130/80   Pulse 98   Resp 16   Ht 5\' 3"  (1.6 m)   Wt 181 lb (82.1 kg)   SpO2 98%   BMI 32.06 kg/m   Patient alert and oriented and in no cardiopulmonary distress.  HEENT: No facial asymmetry, EOMI,   oropharynx pink and moist.  Neck supple no JVD, no mass.  Chest: Clear to auscultation bilaterally.  CVS: S1, S2 no murmurs, no S3.Regular rate.  ABD: Soft non tender.   Ext: No edema  MS: Adequate ROM spine, shoulders, hips and knees.  Skin: Intact, no ulcerations or rash noted.  Psych: Good eye contact, normal affect. Memory intact not anxious or depressed appearing.  CNS: CN 2-12 intact, power,  normal throughout.no focal deficits noted.   Assessment & Plan  Essential hypertension Controlled, no change  in medication DASH diet and commitment to daily physical activity for a minimum of 30 minutes discussed and encouraged, as a part of hypertension management. The importance of attaining a healthy weight is also discussed.  BP/Weight 07/14/2017 02/19/2017 06/27/2016 06/26/2016 05/21/2016 03/20/2016 53/66/4403  Systolic BP 474 259 563 875 643 329 518  Diastolic BP 80 80 70 68 74 78 74  Wt. (Lbs) 181 200 - 198.04 198.12 200 200  BMI 32.06 35.43 - 35.08 35.1 35.43 35.43  Some encounter information is confidential and restricted. Go to Review Flowsheets activity to see all data.       Hypothyroidism Controlled, no change in medication   Hyperlipidemia Hyperlipidemia:Low fat diet discussed and encouraged.   Lipid Panel  Lab Results  Component Value Date   CHOL 171 07/02/2017   HDL 59 07/02/2017   LDLCALC 95 07/02/2017   TRIG 83 07/02/2017   CHOLHDL 2.9 07/02/2017   Controlled, no change in medication     Obesity Improved Patient re-educated about  the importance of commitment to a  minimum of 150 minutes of exercise per week.  The importance of healthy food choices with portion control discussed. Encouraged to start a food diary, count calories and to consider  joining a support group. Sample diet sheets offered. Goals set by the patient for the next several months.   Weight /BMI 07/14/2017 02/19/2017 06/26/2016  WEIGHT 181 lb 200 lb 198 lb 0.6 oz  HEIGHT 5\' 3"  5\' 3"  5\' 3"   BMI 32.06 kg/m2 35.43 kg/m2  35.08 kg/m2  Some encounter information is confidential and restricted. Go to Review Flowsheets activity to see all data.      Depression with anxiety adequate control, pt dealing with grieving as well as to be expected  Allergic rhinitis Current mild flare , medications to be taken daily  IGT (impaired glucose tolerance) Patient educated about the importance of limiting  Carbohydrate intake , the need to commit to daily physical activity for a minimum of 30 minutes , and  to commit weight loss. The fact that changes in all these areas will reduce or eliminate all together the development of diabetes is stressed.   Diabetic Labs Latest Ref Rng & Units 07/02/2017 01/29/2017 08/08/2016 01/04/2016 07/20/2015  HbA1c <5.7 % of total Hgb 5.8(H) 5.6 5.9(H) 5.7(H) 5.6  Chol <200 mg/dL 171 212(H) 198 190 169  HDL >50 mg/dL 59 82 69 70 63  Calc LDL mg/dL (calc) 95 110(H) 110(H) 100 91  Triglycerides <150 mg/dL 83 95 93 99 76  Creatinine 0.50 - 0.99 mg/dL 1.36(H) 1.14(H) 1.02(H) 0.95 1.02   BP/Weight 07/14/2017 02/19/2017 06/27/2016 06/26/2016 05/21/2016 03/20/2016 58/30/9407  Systolic BP 680 881 103 159 458 592 924  Diastolic BP 80 80 70 68 74 78 74  Wt. (Lbs) 181 200 - 198.04 198.12 200 200  BMI 32.06 35.43 - 35.08 35.1 35.43 35.43  Some encounter information is confidential and restricted. Go to Review Flowsheets activity to see all data.   No flowsheet data found.

## 2017-07-21 NOTE — Assessment & Plan Note (Signed)
adequate control, pt dealing with grieving as well as to be expected

## 2017-07-21 NOTE — Assessment & Plan Note (Signed)
Controlled, no change in medication  

## 2017-07-21 NOTE — Assessment & Plan Note (Signed)
Current mild flare , medications to be taken daily

## 2017-07-23 ENCOUNTER — Ambulatory Visit (INDEPENDENT_AMBULATORY_CARE_PROVIDER_SITE_OTHER): Payer: Medicare HMO

## 2017-07-23 VITALS — BP 118/72 | HR 100 | Temp 99.0°F | Resp 20 | Ht 63.0 in | Wt 179.1 lb

## 2017-07-23 DIAGNOSIS — Z0000 Encounter for general adult medical examination without abnormal findings: Secondary | ICD-10-CM

## 2017-07-23 NOTE — Patient Instructions (Signed)
Rebecca Rodgers , Thank you for taking time to come for your Medicare Wellness Visit. I appreciate your ongoing commitment to your health goals. Please review the following plan we discussed and let me know if I can assist you in the future.   Screening recommendations/referrals: Colonoscopy: Due 2023 Mammogram: Please schedule Bone Density: Not yet completed Recommended yearly ophthalmology/optometry visit for glaucoma screening and checkup Recommended yearly dental visit for hygiene and checkup  Vaccinations: Influenza vaccine: Due Fall 2019 Pneumococcal vaccine: Call your insurance company to see if they cover this vaccine Tdap vaccine: UTD Shingles vaccine: Call your insurance company to see if they cover this vaccine  Advanced directives: Patient has but refuses to bring in copy.   Conditions/risks identified: Discussed during your visit  Next appointment: December 2,2019 at 10:20 am with Dr.Simpson  Preventive Care 40-64 Years, Female Preventive care refers to lifestyle choices and visits with your health care provider that can promote health and wellness. What does preventive care include?  A yearly physical exam. This is also called an annual well check.  Dental exams once or twice a year.  Routine eye exams. Ask your health care provider how often you should have your eyes checked.  Personal lifestyle choices, including:  Daily care of your teeth and gums.  Regular physical activity.  Eating a healthy diet.  Avoiding tobacco and drug use.  Limiting alcohol use.  Practicing safe sex.  Taking low-dose aspirin daily starting at age 70.  Taking vitamin and mineral supplements as recommended by your health care provider. What happens during an annual well check? The services and screenings done by your health care provider during your annual well check will depend on your age, overall health, lifestyle risk factors, and family history of disease. Counseling  Your  health care provider may ask you questions about your:  Alcohol use.  Tobacco use.  Drug use.  Emotional well-being.  Home and relationship well-being.  Sexual activity.  Eating habits.  Work and work Statistician.  Method of birth control.  Menstrual cycle.  Pregnancy history. Screening  You may have the following tests or measurements:  Height, weight, and BMI.  Blood pressure.  Lipid and cholesterol levels. These may be checked every 5 years, or more frequently if you are over 40 years old.  Skin check.  Lung cancer screening. You may have this screening every year starting at age 18 if you have a 30-pack-year history of smoking and currently smoke or have quit within the past 15 years.  Fecal occult blood test (FOBT) of the stool. You may have this test every year starting at age 13.  Flexible sigmoidoscopy or colonoscopy. You may have a sigmoidoscopy every 5 years or a colonoscopy every 10 years starting at age 53.  Hepatitis C blood test.  Hepatitis B blood test.  Sexually transmitted disease (STD) testing.  Diabetes screening. This is done by checking your blood sugar (glucose) after you have not eaten for a while (fasting). You may have this done every 1-3 years.  Mammogram. This may be done every 1-2 years. Talk to your health care provider about when you should start having regular mammograms. This may depend on whether you have a family history of breast cancer.  BRCA-related cancer screening. This may be done if you have a family history of breast, ovarian, tubal, or peritoneal cancers.  Pelvic exam and Pap test. This may be done every 3 years starting at age 66. Starting at age 32, this 64  be done every 5 years if you have a Pap test in combination with an HPV test.  Bone density scan. This is done to screen for osteoporosis. You may have this scan if you are at high risk for osteoporosis. Discuss your test results, treatment options, and if  necessary, the need for more tests with your health care provider. Vaccines  Your health care provider may recommend certain vaccines, such as:  Influenza vaccine. This is recommended every year.  Tetanus, diphtheria, and acellular pertussis (Tdap, Td) vaccine. You may need a Td booster every 10 years.  Zoster vaccine. You may need this after age 82.  Pneumococcal 13-valent conjugate (PCV13) vaccine. You may need this if you have certain conditions and were not previously vaccinated.  Pneumococcal polysaccharide (PPSV23) vaccine. You may need one or two doses if you smoke cigarettes or if you have certain conditions. Talk to your health care provider about which screenings and vaccines you need and how often you need them. This information is not intended to replace advice given to you by your health care provider. Make sure you discuss any questions you have with your health care provider. Document Released: 04/07/2015 Document Revised: 11/29/2015 Document Reviewed: 01/10/2015 Elsevier Interactive Patient Education  2017 [employer] Prevention in the Home Falls can cause injuries. They can happen to people of all ages. There are many things you can do to make your home safe and to help prevent falls. What can I do on the outside of my home?  Regularly fix the edges of walkways and driveways and fix any cracks.  Remove anything that might make you trip as you walk through a door, such as a raised step or threshold.  Trim any bushes or trees on the path to your home.  Use bright outdoor lighting.  Clear any walking paths of anything that might make someone trip, such as rocks or tools.  Regularly check to see if handrails are loose or broken. Make sure that both sides of any steps have handrails.  Any raised decks and porches should have guardrails on the edges.  Have any leaves, snow, or ice cleared regularly.  Use sand or salt on walking paths during  winter.  Clean up any spills in your garage right away. This includes oil or grease spills. What can I do in the bathroom?  Use night lights.  Install grab bars by the toilet and in the tub and shower. Do not use towel bars as grab bars.  Use non-skid mats or decals in the tub or shower.  If you need to sit down in the shower, use a plastic, non-slip stool.  Keep the floor dry. Clean up any water that spills on the floor as soon as it happens.  Remove soap buildup in the tub or shower regularly.  Attach bath mats securely with double-sided non-slip rug tape.  Do not have throw rugs and other things on the floor that can make you trip. What can I do in the bedroom?  Use night lights.  Make sure that you have a light by your bed that is easy to reach.  Do not use any sheets or blankets that are too big for your bed. They should not hang down onto the floor.  Have a firm chair that has side arms. You can use this for support while you get dressed.  Do not have throw rugs and other things on the floor that can make you trip.  What can I do in the kitchen?  Clean up any spills right away.  Avoid walking on wet floors.  Keep items that you use a lot in easy-to-reach places.  If you need to reach something above you, use a strong step stool that has a grab bar.  Keep electrical cords out of the way.  Do not use floor polish or wax that makes floors slippery. If you must use wax, use non-skid floor wax.  Do not have throw rugs and other things on the floor that can make you trip. What can I do with my stairs?  Do not leave any items on the stairs.  Make sure that there are handrails on both sides of the stairs and use them. Fix handrails that are broken or loose. Make sure that handrails are as long as the stairways.  Check any carpeting to make sure that it is firmly attached to the stairs. Fix any carpet that is loose or worn.  Avoid having throw rugs at the top or  bottom of the stairs. If you do have throw rugs, attach them to the floor with carpet tape.  Make sure that you have a light switch at the top of the stairs and the bottom of the stairs. If you do not have them, ask someone to add them for you. What else can I do to help prevent falls?  Wear shoes that:  Do not have high heels.  Have rubber bottoms.  Are comfortable and fit you well.  Are closed at the toe. Do not wear sandals.  If you use a stepladder:  Make sure that it is fully opened. Do not climb a closed stepladder.  Make sure that both sides of the stepladder are locked into place.  Ask someone to hold it for you, if possible.  Clearly mark and make sure that you can see:  Any grab bars or handrails.  First and last steps.  Where the edge of each step is.  Use tools that help you move around (mobility aids) if they are needed. These include:  Canes.  Walkers.  Scooters.  Crutches.  Turn on the lights when you go into a dark area. Replace any light bulbs as soon as they burn out.  Set up your furniture so you have a clear path. Avoid moving your furniture around.  If any of your floors are uneven, fix them.  If there are any pets around you, be aware of where they are.  Review your medicines with your doctor. Some medicines can make you feel dizzy. This can increase your chance of falling. Ask your doctor what other things that you can do to help prevent falls. This information is not intended to replace advice given to you by your health care provider. Make sure you discuss any questions you have with your health care provider. Document Released: 01/05/2009 Document Revised: 08/17/2015 Document Reviewed: 04/15/2014 Elsevier Interactive Patient Education  2017 [employer].

## 2017-07-23 NOTE — Progress Notes (Signed)
Subjective:   Rebecca Rodgers is a 62 y.o. female who presents for Medicare Annual (Subsequent) preventive examination.  Review of Systems:   Cardiac Risk Factors include: obesity (BMI >30kg/m2)     Objective:     Vitals: BP 118/72 (BP Location: Left Arm, Patient Position: Sitting, Cuff Size: Large)   Pulse 100   Temp 99 F (37.2 C) (Oral)   Resp 20   Ht 5\' 3"  (1.6 m)   Wt 179 lb 1.3 oz (81.2 kg)   SpO2 96%   BMI 31.72 kg/m   Body mass index is 31.72 kg/m.  Advanced Directives 07/23/2017 06/27/2016 05/21/2016 03/29/2016  Does Patient Have a Medical Advance Directive? Yes No No No  Does patient want to make changes to medical advance directive? No - Patient declined - - -  Would patient like information on creating a medical advance directive? - - Yes (MAU/Ambulatory/Procedural Areas - Information given) No - Patient declined    Tobacco Social History   Tobacco Use  Smoking Status Former Smoker  . Packs/day: 0.25  . Years: 15.00  . Pack years: 3.75  . Types: Cigarettes  . Last attempt to quit: 02/08/2015  . Years since quitting: 2.4  Smokeless Tobacco Former Systems developer  . Types: Chew  . Quit date: 02/18/2016     Counseling given: Yes   Clinical Intake:  Pre-visit preparation completed: Yes  Pain : No/denies pain     BMI - recorded: 31.7 Nutritional Status: BMI > 30  Obese Diabetes: No  How often do you need to have someone help you when you read instructions, pamphlets, or other written materials from your doctor or pharmacy?: 4 - Often What is the last grade level you completed in school?: 12 and some college  Interpreter Needed?: No  Information entered by :: Vilinda Blanks  Past Medical History:  Diagnosis Date  . Asthma   . Hx of hysterectomy   . Hyperlipidemia   . Hypertension   . Hypothyroidism   . Suicide attempt ([place]) 1997   when a relationship broke up    . Suicide attempt ([place])    3 months ago because of ill health of her parents   . Uterine  cancer ([place]) 2005   Past Surgical History:  Procedure Laterality Date  . ABDOMINAL HYSTERECTOMY    . CARPAL TUNNEL RELEASE  1998   bilateral    . COLONOSCOPY  12/20/2010   Procedure: COLONOSCOPY;  Surgeon: Rogene Houston, MD;  [facility];  Service: Endoscopy;  Laterality: N/A;  . COLONOSCOPY N/A 06/27/2016   Procedure: COLONOSCOPY;  Surgeon: Rogene Houston, MD;  [facility];  Service: Endoscopy;  Laterality: N/A;  1030  . DILATION AND CURETTAGE OF UTERUS     at 20   . VESICOVAGINAL FISTULA CLOSURE W/ TAH  2002   dur to uterine cancer    Family History  Problem Relation Age of Onset  . Cataracts Mother   . Hypertension Mother   . Hyperlipidemia Mother   . Dementia Mother   . Depression Mother   . Coronary artery disease Father   . Hyperlipidemia Father   . Stroke Father   . Cancer Sister 56       breast   . Cancer Brother 57       prostate   Social History   Socioeconomic History  . Marital status: Single    Spouse name: Not on file  . Number of children: Not on  file  . Years of education: Not on file  . Highest education level: Not on file  Occupational History  . Occupation: disabled  Social Needs  . Financial resource strain: Hard  . Food insecurity:    Worry: Never true    Inability: Never true  . Transportation needs:    Medical: No    Non-medical: No  Tobacco Use  . Smoking status: Former Smoker    Packs/day: 0.25    Years: 15.00    Pack years: 3.75    Types: Cigarettes    Last attempt to quit: 02/08/2015    Years since quitting: 2.4  . Smokeless tobacco: Former Systems developer    Types: [place] date: 02/18/2016  Substance and Sexual Activity  . Alcohol use: No    Alcohol/week: 0.0 oz  . Drug use: No  . Sexual activity: Not Currently    Birth control/protection: Other-see comments, Surgical  Lifestyle  . Physical activity:    Days per week: 3 days    Minutes per session: 30 min  . Stress: Rather much  Relationships  .  Social connections:    Talks on phone: More than three times a week    Gets together: More than three times a week    Attends religious service: Never    Active member of club or organization: No    Attends meetings of clubs or organizations: Never    Relationship status: Divorced  Other Topics Concern  . Not on file  Social History Narrative   Partner passed in the summer of 2018. This has been very difficult on her.     Outpatient Encounter Medications as of 07/23/2017  Medication Sig  . albuterol (PROVENTIL HFA;VENTOLIN HFA) 108 (90 Base) MCG/ACT inhaler Inhale 2 puffs into the lungs every 6 (six) hours as needed for wheezing or shortness of breath.  Marland Kitchen amLODipine (NORVASC) 10 MG tablet TAKE 1 TABLET DAILY  . buPROPion (WELLBUTRIN XL) 150 MG 24 hr tablet Take 150 mg by mouth daily.  . cholecalciferol (VITAMIN D) 1000 units tablet Take 2,000 Units by mouth every Tuesday.   . clotrimazole-betamethasone (LOTRISONE) cream APPLY TO AFFECTED AREAS TWICE A DAY  . ezetimibe (ZETIA) 10 MG tablet Take 1 tablet (10 mg total) by mouth daily.  Marland Kitchen lamoTRIgine (LAMICTAL) 200 MG tablet Take 1 tablet (200 mg total) by mouth daily.  Marland Kitchen levothyroxine (SYNTHROID, LEVOTHROID) 50 MCG tablet TAKE 1 TABLET DAILY MON-SAT AND 1&1/2 TABLETS ON SUN  . montelukast (SINGULAIR) 10 MG tablet Take 1 tablet (10 mg total) by mouth at bedtime.  Marland Kitchen OLANZapine (ZYPREXA) 5 MG tablet Take 1 tablet (5 mg total) by mouth at bedtime.  Marland Kitchen omeprazole (PRILOSEC) 20 MG capsule Take 20 mg by mouth daily.   . rosuvastatin (CRESTOR) 40 MG tablet TAKE 1 TABLET DAILY  . SYMBICORT 160-4.5 MCG/ACT inhaler 2 PUFFS 2 TIMES A DAY  . traZODone (DESYREL) 100 MG tablet Take 1 tablet (100 mg total) by mouth at bedtime.  . triamterene-hydrochlorothiazide (MAXZIDE-25) 37.5-25 MG tablet Take 1 tablet by mouth daily.  . VOLTAREN 1 % GEL APPLY TO THE AFFECTED AREA(S) TWICE A DAY AS NEEDED FOR PAIN   No facility-administered encounter medications on file  as of 07/23/2017.     Activities of Daily Living In your present state of health, do you have any difficulty performing the following activities: 07/23/2017  Hearing? Y  Comment patient wears a hearing aid  Vision? N  Difficulty concentrating or making decisions?  Y  Comment sometimes  Walking or climbing stairs? N  Comment if it is a lot of stairs, it bothers her  Dressing or bathing? N  Doing errands, shopping? N  Preparing Food and eating ? N  Using the Toilet? N  In the past six months, have you accidently leaked urine? N  Do you have problems with loss of bowel control? N  Managing your Medications? N  Managing your Finances? N  Housekeeping or managing your Housekeeping? N  Some recent data might be hidden    Patient Care Team: Fayrene Helper, MD as PCP - General Harrington Challenger Su Ley, MD as Consulting Physician ([place]) Carole Civil, MD as Consulting Physician (Orthopedic Surgery)    Assessment:   This is a routine wellness examination for Gaytha.  Exercise Activities and Dietary recommendations Current Exercise Habits: The patient does not participate in regular exercise at present, Exercise limited by: orthopedic condition(s)  Goals    None      Fall Risk Fall Risk  07/23/2017 05/21/2016 03/20/2016 08/15/2015 03/29/2015  Falls in the past year? No Yes Yes No No  Number falls in past yr: - 2 or more 2 or more - -  Injury with Fall? - Yes - - -  Follow up - Falls evaluation completed;Falls prevention discussed - - -  Comment - reveals patient accidentally stumbled - - -   Is the patient's home free of loose throw rugs in walkways, pet beds, electrical cords, etc?   yes      Grab bars in the bathroom? yes      Handrails on the stairs?   yes      Adequate lighting?   yes   Depression Screen PHQ 2/9 Scores 07/23/2017 07/14/2017 05/21/2016 08/15/2015  PHQ - 2 Score 2 2 0 1  PHQ- 9 Score - 5 - 5     Cognitive Function     6CIT Screen 07/23/2017  05/21/2016  What Year? 0 points 0 points  What month? 0 points 0 points  What time? 0 points 0 points  Count back from 20 0 points 0 points  Months in reverse 0 points 0 points  Repeat phrase 0 points 0 points  Total Score 0 0    Immunization History  Administered Date(s) Administered  . Influenza Split 01/08/2012, 12/08/2013, 01/06/2015  . Influenza Whole 12/29/2008, 11/28/2010  . Influenza,inj,Quad PF,6+ Mos 01/15/2013, 02/20/2016, 02/19/2017  . Pneumococcal Conjugate-13 10/26/2013  . Tdap 03/26/2010, 09/17/2016  . Zoster Recombinat (Shingrix) 11/05/2016    Qualifies for Shingles Vaccine?Patient will call insurance company to see if this is covered  Screening Tests Health Maintenance  Topic Date Due  . INFLUENZA VACCINE  10/23/2017  . PAP SMEAR  11/06/2017  . MAMMOGRAM  09/20/2018  . COLONOSCOPY  06/27/2021  . TETANUS/TDAP  09/18/2026  . Hepatitis C Screening  Completed  . HIV Screening  Completed    Cancer Screenings: Lung: Low Dose CT Chest recommended if Age 61-80 years, 30 pack-year currently smoking OR have quit w/in 15years. Patient does not qualify. Breast:  Up to date on Mammogram? No   Up to date of Bone Density/Dexa? No Colorectal: UTD Additional Screenings:  Hepatitis C Screening: Completed on 11/29/2014     Plan:     I have personally reviewed and noted the following in the patient's chart:   . Medical and social history . Use of alcohol, tobacco or illicit drugs  . Current medications and supplements . Functional ability and  status . Nutritional status . Physical activity . Advanced directives . List of other physicians . Hospitalizations, surgeries, and ER visits in previous 12 months . Vitals . Screenings to include cognitive, depression, and falls . Referrals and appointments  In addition, I have reviewed and discussed with patient certain preventive protocols, quality metrics, and best practice recommendations. A written personalized care  plan for preventive services as well as general preventive health recommendations were provided to patient.     Tod Persia, [place]  07/23/2017

## 2017-07-25 ENCOUNTER — Other Ambulatory Visit: Payer: Self-pay | Admitting: Family Medicine

## 2017-07-28 ENCOUNTER — Ambulatory Visit: Payer: Self-pay

## 2017-08-22 ENCOUNTER — Other Ambulatory Visit: Payer: Self-pay | Admitting: Family Medicine

## 2017-09-22 ENCOUNTER — Ambulatory Visit (HOSPITAL_COMMUNITY)
Admission: RE | Admit: 2017-09-22 | Discharge: 2017-09-22 | Disposition: A | Discharge status: Home / Self Care | Payer: Medicare HMO | Source: Ambulatory Visit | Attending: Family Medicine | Admitting: Family Medicine

## 2017-09-22 ENCOUNTER — Other Ambulatory Visit: Payer: Self-pay | Admitting: Family Medicine

## 2017-09-22 ENCOUNTER — Encounter (HOSPITAL_COMMUNITY): Payer: Self-pay

## 2017-09-22 DIAGNOSIS — Z1231 Encounter for screening mammogram for malignant neoplasm of breast: Secondary | ICD-10-CM | POA: Diagnosis not present

## 2017-10-24 ENCOUNTER — Other Ambulatory Visit: Payer: Self-pay | Admitting: Family Medicine

## 2017-10-28 ENCOUNTER — Other Ambulatory Visit: Payer: Self-pay | Admitting: Family Medicine

## 2017-11-25 ENCOUNTER — Other Ambulatory Visit: Payer: Self-pay | Admitting: Family Medicine

## 2017-12-01 ENCOUNTER — Telehealth: Payer: Self-pay | Admitting: Family Medicine

## 2017-12-01 NOTE — Telephone Encounter (Signed)
This was sent on 9/6

## 2017-12-01 NOTE — Telephone Encounter (Signed)
Needs new rx faxed Crestor

## 2017-12-16 DIAGNOSIS — F3175 Bipolar disorder, in partial remission, most recent episode depressed: Secondary | ICD-10-CM | POA: Diagnosis not present

## 2017-12-25 ENCOUNTER — Other Ambulatory Visit: Payer: Self-pay | Admitting: Family Medicine

## 2018-01-08 DIAGNOSIS — M542 Cervicalgia: Secondary | ICD-10-CM | POA: Diagnosis not present

## 2018-01-20 ENCOUNTER — Ambulatory Visit: Payer: Medicare HMO | Admitting: Orthopaedic Surgery

## 2018-01-20 ENCOUNTER — Ambulatory Visit (INDEPENDENT_AMBULATORY_CARE_PROVIDER_SITE_OTHER): Payer: Medicare HMO

## 2018-01-20 ENCOUNTER — Encounter: Payer: Self-pay | Admitting: Orthopaedic Surgery

## 2018-01-20 VITALS — BP 131/80 | HR 104 | Ht 63.0 in | Wt 163.0 lb

## 2018-01-20 DIAGNOSIS — M5440 Lumbago with sciatica, unspecified side: Secondary | ICD-10-CM

## 2018-01-20 DIAGNOSIS — M545 Low back pain, unspecified: Secondary | ICD-10-CM

## 2018-01-20 MED ORDER — CYCLOBENZAPRINE HCL 10 MG PO TABS: 10.0000 mg | ORAL_TABLET | Freq: Every day | ORAL | 0 refills | Status: DC
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
## 2018-01-26 ENCOUNTER — Encounter: Payer: Self-pay | Admitting: Physical Therapy

## 2018-01-26 ENCOUNTER — Ambulatory Visit: Payer: Medicare HMO | Attending: Orthopaedic Surgery | Admitting: Physical Therapy

## 2018-01-26 DIAGNOSIS — R2689 Other abnormalities of gait and mobility: Secondary | ICD-10-CM | POA: Diagnosis not present

## 2018-01-26 DIAGNOSIS — M545 Low back pain, unspecified: Secondary | ICD-10-CM

## 2018-01-26 DIAGNOSIS — R600 Localized edema: Secondary | ICD-10-CM | POA: Insufficient documentation
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
## 2018-01-28 ENCOUNTER — Ambulatory Visit: Payer: Medicare HMO | Admitting: Physical Therapy

## 2018-01-28 ENCOUNTER — Encounter: Payer: Self-pay | Admitting: Physical Therapy

## 2018-01-28 DIAGNOSIS — R600 Localized edema: Secondary | ICD-10-CM

## 2018-01-28 DIAGNOSIS — M545 Low back pain, unspecified: Secondary | ICD-10-CM

## 2018-01-28 DIAGNOSIS — R2689 Other abnormalities of gait and mobility: Secondary | ICD-10-CM | POA: Diagnosis not present

## 2018-01-28 NOTE — Therapy (Signed)
[facility] [place], [place], [address] Phone: [phone]   Fax:  [phone]  Physical Therapy Treatment  Patient Details  Name: Rebecca Rodgers MRN: [id number] Date of Birth: [birth year] Referring Provider (PT): Sanjuana Kava MD.   Encounter Date: 01/28/2018  PT End of Session - 01/28/18 1458    Visit Number  2    Number of Visits  12    Date for PT Re-Evaluation  02/23/18    PT Start Time  0145    PT Stop Time  0240    PT Time Calculation (min)  55 min    Activity Tolerance  Patient tolerated treatment well    Behavior During Therapy  [facility] for tasks assessed/performed       Past Medical History:  Diagnosis Date  . Asthma   . Hx of hysterectomy   . Hyperlipidemia   . Hypertension   . Hypothyroidism   . Suicide attempt ([place]) 1997   when a relationship broke up    . Suicide attempt ([place])    3 months ago because of ill health of her parents   . Uterine cancer ([place]) 2005    Past Surgical History:  Procedure Laterality Date  . ABDOMINAL HYSTERECTOMY    . CARPAL TUNNEL RELEASE  1998   bilateral    . COLONOSCOPY  12/20/2010   Procedure: COLONOSCOPY;  Surgeon: Rogene Houston, MD;  [facility];  Service: Endoscopy;  Laterality: N/A;  . COLONOSCOPY N/A 06/27/2016   Procedure: COLONOSCOPY;  Surgeon: Rogene Houston, MD;  [facility];  Service: Endoscopy;  Laterality: N/A;  1030  . DILATION AND CURETTAGE OF UTERUS     at 20   . VESICOVAGINAL FISTULA CLOSURE W/ TAH  2002   dur to uterine cancer     There were no vitals filed for this visit.  Subjective Assessment - 01/28/18 1404    Subjective  I was playing with my dog andmy pain went up to a 9.    Patient Stated Goals  Get out of pain like before accident.    Currently in Pain?  Yes    Pain Score  9     Pain Location  Back    Pain Orientation  Right    Pain Descriptors / Indicators  Aching;Throbbing    Pain Type  Acute pain    Pain  Onset  1 to 4 weeks ago                       [facility] Adult PT Treatment/Exercise - 01/28/18 0001      Modalities   Modalities  Electrical Stimulation;Ultrasound      Moist Heat Therapy   Number Minutes Moist Heat  20 Minutes    Moist Heat Location  Lumbar Spine      Electrical Stimulation   Electrical Stimulation Location  RT lower thoracic/lumbar and upper gluteal region.    Electrical Stimulation Action  Pre-mod (4 electrodes)    Electrical Stimulation Parameters  80-150 Hz x 20 minutes.    Electrical Stimulation Goals  Pain      Ultrasound   Ultrasound Location  Right lower thoracic/lumbar and upper glut.    Ultrasound Parameters  1.50 W/CM2 x 12 minutes.    Ultrasound Goals  Pain      Manual Therapy   Manual Therapy  Soft tissue mobilization    Soft tissue mobilization  STW/M x 12 minutes to  affected right spinal musculature to reduce tone.             PT Education - 01/28/18 1501    Education Details  HEP.    Person(s) Educated  Patient    Methods  Explanation;Demonstration;Handout    Comprehension  Verbalized understanding;Returned demonstration          PT Long Term Goals - 01/26/18 1427      PT LONG TERM GOAL #1   Title  Independent with a HEP.    Time  4    Period  Weeks    Status  New      PT LONG TERM GOAL #2   Title  Stand 30 minutes with pain not > 2-3/10.    Time  4    Period  Weeks    Status  New      PT LONG TERM GOAL #3   Title  Sit 30 minutes with pain not > 2-3/10.    Time  4    Period  Weeks    Status  New      PT LONG TERM GOAL #4   Title  Perform ADL's with pain not > 2-3/10.    Time  4    Period  Weeks    Status  New            Plan - 01/28/18 1504    Clinical Impression Statement  Patient did well with treatment today.  Added right SKTC stretch to begin at home.      PT Treatment/Interventions  ADLs/Self Care Home Management;Cryotherapy;Electrical Stimulation;Ultrasound;Moist Heat;Therapeutic  activities;Therapeutic exercise;Patient/family education;Manual techniques;Dry needling    PT Next Visit Plan  Combo e'stim/U/S; STW/M; HMP and electrical stimulation.  S and DKTC; hip bridges; progress into to core exercise program.    Consulted and Agree with Plan of Care  Patient       Patient will benefit from skilled therapeutic intervention in order to improve the following deficits and impairments:  Pain, Decreased activity tolerance, Decreased range of motion, Increased muscle spasms  Visit Diagnosis: Acute right-sided low back pain without sciatica  Other abnormalities of gait and mobility  Localized edema     Problem List Patient Active Problem List   Diagnosis Date Noted  . IGT (impaired glucose tolerance) 07/21/2017  . Chronic right shoulder pain 02/19/2017  . Hx of colonic polyps 04/05/2016  . Hyperlipidemia 08/30/2012  . Metabolic syndrome X 50/11/3816  . OA (osteoarthritis) of knee 01/22/2012  . Allergic rhinitis 07/21/2010  . Cough variant asthma 11/29/2009  . Obesity 01/08/2009  . Hypothyroidism 08/23/2008  . Depression with anxiety 08/23/2008  . Essential hypertension 08/23/2008    Reina Wilton, [place] MPT 01/28/2018, 3:06 PM  [facility] [address] [place], [place], [address] Phone: [phone]   Fax:  [phone]  Name: LESLEY ATKIN MRN: [id number] Date of Birth: 1956-02-20

## 2018-02-02 ENCOUNTER — Encounter: Payer: Self-pay | Admitting: Family Medicine

## 2018-02-03 ENCOUNTER — Encounter: Payer: Self-pay | Admitting: Orthopaedic Surgery

## 2018-02-03 ENCOUNTER — Ambulatory Visit: Payer: Medicare HMO | Admitting: Orthopaedic Surgery

## 2018-02-03 VITALS — BP 125/80 | HR 92 | Ht 63.0 in | Wt 160.0 lb

## 2018-02-03 DIAGNOSIS — M545 Low back pain, unspecified: Secondary | ICD-10-CM

## 2018-02-03 NOTE — Progress Notes (Signed)
Patient EU:MPNTIRWE Rebecca Rodgers, female DOB:1955/05/22, 61 y.o. RXV:[id number]  Chief Complaint  Patient presents with  . Back Pain    HPI  Rebecca Rodgers is a 62 y.o. female who has continued lower back pain. She has had some increased pain with the cold weather.  She has been to PT and it has helped.  The Flexeril made her dizzy and she stopped it.  She is doing her exercises at home.  She will continue the PT.  She has no weakness.   Body mass index is 28.34 kg/m.  ROS  Review of Systems  Constitutional: Positive for activity change.  Respiratory: Positive for shortness of breath. Negative for cough.   Musculoskeletal: Positive for arthralgias and back pain.  All other systems reviewed and are negative.   All other systems reviewed and are negative.  The following is a summary of the past history medically, past history surgically, known current medicines, social history and family history.  This information is gathered electronically by the computer from prior information and documentation.  I review this each visit and have found including this information at this point in the chart is beneficial and informative.    Past Medical History:  Diagnosis Date  . Asthma   . Hx of hysterectomy   . Hyperlipidemia   . Hypertension   . Hypothyroidism   . Suicide attempt ([place]) 1997   when a relationship broke up    . Suicide attempt ([place])    3 months ago because of ill health of her parents   . Uterine cancer ([place]) 2005    Past Surgical History:  Procedure Laterality Date  . ABDOMINAL HYSTERECTOMY    . CARPAL TUNNEL RELEASE  1998   bilateral    . COLONOSCOPY  12/20/2010   Procedure: COLONOSCOPY;  Surgeon: Rebecca Houston, MD;  [facility];  Service: Endoscopy;  Laterality: N/A;  . COLONOSCOPY N/A 06/27/2016   Procedure: COLONOSCOPY;  Surgeon: Rebecca Houston, MD;  [facility];  Service: Endoscopy;  Laterality: N/A;  1030  . DILATION AND CURETTAGE OF UTERUS      at 20   . VESICOVAGINAL FISTULA CLOSURE W/ TAH  2002   dur to uterine cancer     Family History  Problem Relation Age of Onset  . Cataracts Mother   . Hypertension Mother   . Hyperlipidemia Mother   . Dementia Mother   . Depression Mother   . Coronary artery disease Father   . Hyperlipidemia Father   . Stroke Father   . Cancer Sister 2       breast   . Cancer Brother 28       prostate    Social History Social History   Tobacco Use  . Smoking status: Former Smoker    Packs/day: 0.25    Years: 15.00    Pack years: 3.75    Types: Cigarettes    Last attempt to quit: 02/08/2015    Years since quitting: 2.9  . Smokeless tobacco: Former Systems developer    Types: Chew    Quit date: 02/18/2016  Substance Use Topics  . Alcohol use: No    Alcohol/week: 0.0 standard drinks  . Drug use: No    Allergies  Allergen Reactions  . Codeine     Lip swelling   . Penicillins     Lip swelling Has patient had a PCN reaction causing immediate rash, facial/tongue/throat swelling, SOB or lightheadedness with hypotension: Yes Has patient  had a PCN reaction causing severe rash involving mucus membranes or skin necrosis: No Has patient had a PCN reaction that required hospitalization No Has patient had a PCN reaction occurring within the last 10 years: No If all of the above answers are "NO", then may proceed with Cephalosporin use.     Current Outpatient Medications  Medication Sig Dispense Refill  . albuterol (PROVENTIL HFA;VENTOLIN HFA) 108 (90 Base) MCG/ACT inhaler Inhale 2 puffs into the lungs every 6 (six) hours as needed for wheezing or shortness of breath. 1 Inhaler 0  . amLODipine (NORVASC) 10 MG tablet TAKE 1 TABLET DAILY 90 tablet 2  . buPROPion (WELLBUTRIN XL) 150 MG 24 hr tablet Take 150 mg by mouth daily.    . cholecalciferol (VITAMIN D) 1000 units tablet Take 2,000 Units by mouth every Tuesday.     . clotrimazole-betamethasone (LOTRISONE) cream APPLY TO AFFECTED AREAS TWICE A  DAY 45 g 0  . ezetimibe (ZETIA) 10 MG tablet Take 1 tablet (10 mg total) by mouth daily. 90 tablet 3  . ibuprofen (ADVIL,MOTRIN) 800 MG tablet Take one po tid x 10 days    . lamoTRIgine (LAMICTAL) 200 MG tablet Take 1 tablet (200 mg total) by mouth daily. 30 tablet 2  . levothyroxine (SYNTHROID, LEVOTHROID) 50 MCG tablet TAKE 1 TABLET DAILY MON-SAT AND 1&1/2 TABLETS ON SUN 100 tablet 2  . montelukast (SINGULAIR) 10 MG tablet Take 1 tablet (10 mg total) by mouth at bedtime. 90 tablet 3  . OLANZapine (ZYPREXA) 5 MG tablet Take 1 tablet (5 mg total) by mouth at bedtime. 30 tablet 2  . omeprazole (PRILOSEC) 20 MG capsule Take 20 mg by mouth daily.     . rosuvastatin (CRESTOR) 40 MG tablet TAKE 1 TABLET DAILY AT BEDTIME FOR CHOLESTEROL 90 tablet 0  . SYMBICORT 160-4.5 MCG/ACT inhaler 2 PUFFS 2 TIMES A DAY 30.6 g 1  . traZODone (DESYREL) 100 MG tablet Take 1 tablet (100 mg total) by mouth at bedtime. 30 tablet 2  . triamterene-hydrochlorothiazide (MAXZIDE-25) 37.5-25 MG tablet Take 1 tablet by mouth daily. 90 tablet 0  . VOLTAREN 1 % GEL APPLY TO THE AFFECTED AREA(S) TWICE A DAY AS NEEDED FOR PAIN 100 g 0   No current facility-administered medications for this visit.      Physical Exam  Blood pressure 125/80, pulse 92, height 5\' 3"  (1.6 m), weight 160 lb (72.6 kg).  Constitutional: overall normal hygiene, normal nutrition, well developed, normal grooming, normal body habitus. Assistive device:none  Musculoskeletal: gait and station Limp none, muscle tone and strength are normal, no tremors or atrophy is present.  .  Neurological: coordination overall normal.  Deep tendon reflex/nerve stretch intact.  Sensation normal.  Cranial nerves II-XII intact.   Skin:   Normal overall no scars, lesions, ulcers or rashes. No psoriasis.  Psychiatric: Alert and oriented x 3.  Recent memory intact, remote memory unclear.  Normal mood and affect. Well groomed.  Good eye contact.  Cardiovascular: overall no  swelling, no varicosities, no edema bilaterally, normal temperatures of the legs and arms, no clubbing, cyanosis and good capillary refill.  Lymphatic: palpation is normal.  Spine/Pelvis examination:  Inspection:  Overall, sacoiliac joint benign and hips nontender; without crepitus or defects.   Thoracic spine inspection: Alignment normal without kyphosis present   Lumbar spine inspection:  Alignment  with normal lumbar lordosis, without scoliosis apparent.   Thoracic spine palpation:  without tenderness of spinal processes   Lumbar spine palpation: without tenderness  of lumbar area; without tightness of lumbar muscles    Range of Motion:   Lumbar flexion, forward flexion is normal without pain or tenderness    Lumbar extension is full without pain or tenderness   Left lateral bend is normal without pain or tenderness   Right lateral bend is normal without pain or tenderness   Straight leg raising is normal  Strength & tone: normal   Stability overall normal stability All other systems reviewed and are negative   The patient has been educated about the nature of the problem(s) and counseled on treatment options.  The patient appeared to understand what I have discussed and is in agreement with it.  Encounter Diagnosis  Name Primary?  . Acute midline low back pain without sciatica Yes    PLAN Call if any problems.  Precautions discussed.  Continue current medications.   Return to clinic 3 weeks   Continue PT.  I have reviewed the PT notes.  Electronically Signed Sanjuana Kava, MD 11/12/20192:22 PM

## 2018-02-04 ENCOUNTER — Ambulatory Visit: Payer: Medicare HMO | Admitting: Physical Therapy

## 2018-02-04 ENCOUNTER — Encounter: Payer: Self-pay | Admitting: Physical Therapy

## 2018-02-04 DIAGNOSIS — M545 Low back pain, unspecified: Secondary | ICD-10-CM

## 2018-02-04 DIAGNOSIS — R600 Localized edema: Secondary | ICD-10-CM

## 2018-02-04 DIAGNOSIS — R2689 Other abnormalities of gait and mobility: Secondary | ICD-10-CM | POA: Diagnosis not present

## 2018-02-04 NOTE — Therapy (Signed)
[facility] [place], [place], [address] Phone: [phone]   Fax:  [phone]  Physical Therapy Treatment  Patient Details  Name: Rebecca Rodgers MRN: [id number] Date of Birth: June 06, 1955 Referring Provider (PT): Sanjuana Kava MD.   Encounter Date: 02/04/2018  PT End of Session - 02/04/18 1423    Visit Number  3    Number of Visits  12    Date for PT Re-Evaluation  02/23/18    PT Start Time  1346    PT Stop Time  1431    PT Time Calculation (min)  45 min    Activity Tolerance  Patient tolerated treatment well    Behavior During Therapy  [facility] for tasks assessed/performed       Past Medical History:  Diagnosis Date  . Asthma   . Hx of hysterectomy   . Hyperlipidemia   . Hypertension   . Hypothyroidism   . Suicide attempt ([place]) 1997   when a relationship broke up    . Suicide attempt ([place])    3 months ago because of ill health of her parents   . Uterine cancer ([place]) 2005    Past Surgical History:  Procedure Laterality Date  . ABDOMINAL HYSTERECTOMY    . CARPAL TUNNEL RELEASE  1998   bilateral    . COLONOSCOPY  12/20/2010   Procedure: COLONOSCOPY;  Surgeon: Rogene Houston, MD;  [facility];  Service: Endoscopy;  Laterality: N/A;  . COLONOSCOPY N/A 06/27/2016   Procedure: COLONOSCOPY;  Surgeon: Rogene Houston, MD;  [facility];  Service: Endoscopy;  Laterality: N/A;  1030  . DILATION AND CURETTAGE OF UTERUS     at 20   . VESICOVAGINAL FISTULA CLOSURE W/ TAH  2002   dur to uterine cancer     There were no vitals filed for this visit.  Subjective Assessment - 02/04/18 1419    Subjective  Patient reported feeling some better after last treatment  yet ongoing soreness in right low back    Pertinent History  Ankle fracture.    Limitations  Sitting    How long can you sit comfortably?  20 minutes.    How long can you stand comfortably?  10 minutes (ie:  Doing dishes).    Diagnostic  tests  X-ray revealed:  Mild degenerative changes of the lumbar spine, no acute findings.    Patient Stated Goals  Get out of pain like before accident.    Currently in Pain?  Yes    Pain Score  8     Pain Location  Back    Pain Orientation  Right    Pain Descriptors / Indicators  Aching;Discomfort    Pain Type  Acute pain    Pain Onset  1 to 4 weeks ago    Pain Frequency  Constant    Aggravating Factors   certain movements    Pain Relieving Factors  rest                       [facility] Adult PT Treatment/Exercise - 02/04/18 0001      Moist Heat Therapy   Number Minutes Moist Heat  15 Minutes    Moist Heat Location  Lumbar Spine      Electrical Stimulation   Electrical Stimulation Location  RT lower thoracic/lumbar and upper gluteal region.    Electrical Stimulation Action  IFC    Electrical Stimulation Parameters  80-150  x66min    Electrical Stimulation Goals  Pain      Ultrasound   Ultrasound Location  Right lower lumbar/glut    Ultrasound Parameters  1.5w/cm2/31mhz x60min    Ultrasound Goals  Pain      Manual Therapy   Manual Therapy  Soft tissue mobilization;Myofascial release    Manual therapy comments  MFR to right glut    Soft tissue mobilization  manual STW to right lower back and glut to reduce pain and tone                  PT Long Term Goals - 02/04/18 1423      PT LONG TERM GOAL #1   Title  Independent with a HEP.    Time  4    Period  Weeks    Status  On-going      PT LONG TERM GOAL #2   Title  Stand 30 minutes with pain not > 2-3/10.    Time  4    Period  Weeks    Status  On-going      PT LONG TERM GOAL #3   Title  Sit 30 minutes with pain not > 2-3/10.    Time  4    Period  Weeks    Status  On-going      PT LONG TERM GOAL #4   Title  Perform ADL's with pain not > 2-3/10.    Time  4    Period  Weeks    Status  On-going            Plan - 02/04/18 1424    Clinical Impression Statement  Patient tolerated  treatment well today. Patient had some tightness in right low back/glut then it reduced after manual STW/MFR. Patient reported more discomfort when playing with dog or getting out of bed. patient felt better after treatment. Goals ongoing at this time.     Rehab Potential  Excellent    PT Treatment/Interventions  ADLs/Self Care Home Management;Cryotherapy;Electrical Stimulation;Ultrasound;Moist Heat;Therapeutic activities;Therapeutic exercise;Patient/family education;Manual techniques;Dry needling    PT Next Visit Plan  Combo e'stim/U/S; STW/M; HMP and electrical stimulation.  S and DKTC; hip bridges; progress into to core exercise program.    Consulted and Agree with Plan of Care  Patient       Patient will benefit from skilled therapeutic intervention in order to improve the following deficits and impairments:  Pain, Decreased activity tolerance, Decreased range of motion, Increased muscle spasms  Visit Diagnosis: Acute right-sided low back pain without sciatica  Other abnormalities of gait and mobility  Localized edema     Problem List Patient Active Problem List   Diagnosis Date Noted  . IGT (impaired glucose tolerance) 07/21/2017  . Chronic right shoulder pain 02/19/2017  . Hx of colonic polyps 04/05/2016  . Hyperlipidemia 08/30/2012  . Metabolic syndrome X 66/29/4765  . OA (osteoarthritis) of knee 01/22/2012  . Allergic rhinitis 07/21/2010  . Cough variant asthma 11/29/2009  . Obesity 01/08/2009  . Hypothyroidism 08/23/2008  . Depression with anxiety 08/23/2008  . Essential hypertension 08/23/2008    Phillips Climes, PTA 02/04/2018, 2:34 PM  [facility] [place], [place], [address] Phone: [phone]   Fax:  [phone]  Name: Rebecca Rodgers MRN: [id number] Date of Birth: 06/12/1955

## 2018-02-10 ENCOUNTER — Encounter: Payer: Self-pay | Admitting: Physical Therapy

## 2018-02-10 ENCOUNTER — Ambulatory Visit: Payer: Medicare HMO | Admitting: Physical Therapy

## 2018-02-10 DIAGNOSIS — R600 Localized edema: Secondary | ICD-10-CM | POA: Diagnosis not present

## 2018-02-10 DIAGNOSIS — R2689 Other abnormalities of gait and mobility: Secondary | ICD-10-CM | POA: Diagnosis not present

## 2018-02-10 DIAGNOSIS — M545 Low back pain, unspecified: Secondary | ICD-10-CM

## 2018-02-10 NOTE — Therapy (Signed)
[facility] [place], [place], [address] Phone: [phone]   Fax:  [phone]  Physical Therapy Treatment  Patient Details  Name: KEMYA SHED MRN: [id number] Date of Birth: 22-Jun-1955 Referring Provider (PT): Sanjuana Kava MD.   Encounter Date: 02/10/2018  PT End of Session - 02/10/18 1325    Visit Number  4    Number of Visits  12    Date for PT Re-Evaluation  02/23/18    PT Start Time  6378    PT Stop Time  1344    PT Time Calculation (min)  51 min    Activity Tolerance  Patient tolerated treatment well    Behavior During Therapy  [facility] for tasks assessed/performed       Past Medical History:  Diagnosis Date  . Asthma   . Hx of hysterectomy   . Hyperlipidemia   . Hypertension   . Hypothyroidism   . Suicide attempt ([place]) 1997   when a relationship broke up    . Suicide attempt ([place])    3 months ago because of ill health of her parents   . Uterine cancer ([place]) 2005    Past Surgical History:  Procedure Laterality Date  . ABDOMINAL HYSTERECTOMY    . CARPAL TUNNEL RELEASE  1998   bilateral    . COLONOSCOPY  12/20/2010   Procedure: COLONOSCOPY;  Surgeon: Rogene Houston, MD;  [facility];  Service: Endoscopy;  Laterality: N/A;  . COLONOSCOPY N/A 06/27/2016   Procedure: COLONOSCOPY;  Surgeon: Rogene Houston, MD;  [facility];  Service: Endoscopy;  Laterality: N/A;  1030  . DILATION AND CURETTAGE OF UTERUS     at 20   . VESICOVAGINAL FISTULA CLOSURE W/ TAH  2002   dur to uterine cancer     There were no vitals filed for this visit.  Subjective Assessment - 02/10/18 1327    Subjective  Pain staying at 8/10.    Pertinent History  Ankle fracture.    Limitations  Sitting    How long can you sit comfortably?  20 minutes.    How long can you stand comfortably?  10 minutes (ie:  Doing dishes).    Diagnostic tests  X-ray revealed:  Mild degenerative changes of the lumbar spine, no acute  findings.    Patient Stated Goals  Get out of pain like before accident.    Currently in Pain?  Yes    Pain Score  8     Pain Location  Back    Pain Orientation  Right    Pain Descriptors / Indicators  Aching    Pain Onset  1 to 4 weeks ago                       [facility] Adult PT Treatment/Exercise - 02/10/18 0001      Moist Heat Therapy   Number Minutes Moist Heat  20 Minutes    Moist Heat Location  Lumbar Spine      Electrical Stimulation   Electrical Stimulation Location  Right lower thoracic/lumbar.    Electrical Stimulation Action  Pre-mod.    Electrical Stimulation Parameters  80-150 Hz x 20 minutes.      Ultrasound   Ultrasound Location  Right lower thoracic/lumbar.    Ultrasound Parameters  1.50 W/CM2 x 12 minutes.    Ultrasound Goals  Pain      Manual Therapy   Manual Therapy  Soft tissue mobilization    Soft tissue mobilization  STW/M to left lower thoracic and left QL x 11 minutes.                  PT Long Term Goals - 02/04/18 1423      PT LONG TERM GOAL #1   Title  Independent with a HEP.    Time  4    Period  Weeks    Status  On-going      PT LONG TERM GOAL #2   Title  Stand 30 minutes with pain not > 2-3/10.    Time  4    Period  Weeks    Status  On-going      PT LONG TERM GOAL #3   Title  Sit 30 minutes with pain not > 2-3/10.    Time  4    Period  Weeks    Status  On-going      PT LONG TERM GOAL #4   Title  Perform ADL's with pain not > 2-3/10.    Time  4    Period  Weeks    Status  On-going            Plan - 02/10/18 1331    Clinical Impression Statement  Patient reporting an 8/10 pain-level though she is not reporting the left upper gluteal pain she once was.  Most pain localized to left lower thoracic region and left QL which continues to have a palpable trigger point.    Rehab Potential  Excellent    PT Treatment/Interventions  ADLs/Self Care Home Management;Cryotherapy;Electrical  Stimulation;Ultrasound;Moist Heat;Therapeutic activities;Therapeutic exercise;Patient/family education;Manual techniques;Dry needling    PT Next Visit Plan  Combo e'stim/U/S; STW/M; HMP and electrical stimulation.  S and DKTC; hip bridges; progress into to core exercise program.    Consulted and Agree with Plan of Care  Patient       Patient will benefit from skilled therapeutic intervention in order to improve the following deficits and impairments:  Pain, Decreased activity tolerance, Decreased range of motion, Increased muscle spasms  Visit Diagnosis: Acute right-sided low back pain without sciatica  Other abnormalities of gait and mobility  Localized edema     Problem List Patient Active Problem List   Diagnosis Date Noted  . IGT (impaired glucose tolerance) 07/21/2017  . Chronic right shoulder pain 02/19/2017  . Hx of colonic polyps 04/05/2016  . Hyperlipidemia 08/30/2012  . Metabolic syndrome X 96/28/3662  . OA (osteoarthritis) of knee 01/22/2012  . Allergic rhinitis 07/21/2010  . Cough variant asthma 11/29/2009  . Obesity 01/08/2009  . Hypothyroidism 08/23/2008  . Depression with anxiety 08/23/2008  . Essential hypertension 08/23/2008    Nadie Fiumara, [place] MPT 02/10/2018, 1:46 PM  [facility] [address] [place], [place], [address] Phone: [phone]   Fax:  [phone]  Name: MISKI FELDPAUSCH MRN: [id number] Date of Birth: Jul 21, 1955

## 2018-02-11 ENCOUNTER — Encounter: Payer: Self-pay | Admitting: Physical Therapy

## 2018-02-12 ENCOUNTER — Ambulatory Visit: Payer: Medicare HMO | Admitting: Physical Therapy

## 2018-02-12 ENCOUNTER — Encounter: Payer: Self-pay | Admitting: Physical Therapy

## 2018-02-12 DIAGNOSIS — M545 Low back pain, unspecified: Secondary | ICD-10-CM

## 2018-02-12 DIAGNOSIS — R600 Localized edema: Secondary | ICD-10-CM

## 2018-02-12 DIAGNOSIS — R2689 Other abnormalities of gait and mobility: Secondary | ICD-10-CM

## 2018-02-12 NOTE — Therapy (Signed)
[facility] [place], [place], [address] Phone: [phone]   Fax:  [phone]  Physical Therapy Treatment  Patient Details  Name: Rebecca Rodgers MRN: [id number] Date of Birth: 1956/03/21 Referring Provider (PT): Sanjuana Kava MD.   Encounter Date: 02/12/2018  PT End of Session - 02/12/18 1514    Visit Number  5    Number of Visits  12    Date for PT Re-Evaluation  02/23/18    PT Start Time  1518    PT Stop Time  1603    PT Time Calculation (min)  45 min    Activity Tolerance  Patient tolerated treatment well    Behavior During Therapy  [facility] for tasks assessed/performed       Past Medical History:  Diagnosis Date  . Asthma   . Hx of hysterectomy   . Hyperlipidemia   . Hypertension   . Hypothyroidism   . Suicide attempt ([place]) 1997   when a relationship broke up    . Suicide attempt ([place])    3 months ago because of ill health of her parents   . Uterine cancer ([place]) 2005    Past Surgical History:  Procedure Laterality Date  . ABDOMINAL HYSTERECTOMY    . CARPAL TUNNEL RELEASE  1998   bilateral    . COLONOSCOPY  12/20/2010   Procedure: COLONOSCOPY;  Surgeon: Rogene Houston, MD;  [facility];  Service: Endoscopy;  Laterality: N/A;  . COLONOSCOPY N/A 06/27/2016   Procedure: COLONOSCOPY;  Surgeon: Rogene Houston, MD;  [facility];  Service: Endoscopy;  Laterality: N/A;  1030  . DILATION AND CURETTAGE OF UTERUS     at 20   . VESICOVAGINAL FISTULA CLOSURE W/ TAH  2002   dur to uterine cancer     There were no vitals filed for this visit.  Subjective Assessment - 02/12/18 1514    Subjective  Reports going her a ride with her brother in his truck and had 10+ pain. Reports no pain once she was in her car.     Pertinent History  Ankle fracture.    Limitations  Sitting    How long can you sit comfortably?  20 minutes.    How long can you stand comfortably?  10 minutes (ie:  Doing dishes).     Diagnostic tests  X-ray revealed:  Mild degenerative changes of the lumbar spine, no acute findings.    Patient Stated Goals  Get out of pain like before accident.    Currently in Pain?  Yes    Pain Score  8     Pain Location  Back    Pain Orientation  Right;Lower    Pain Descriptors / Indicators  Discomfort    Pain Type  Acute pain    Pain Onset  1 to 4 weeks ago         [facility] PT Assessment - 02/12/18 0001      Assessment   Medical Diagnosis  Acute midline low back pain without sciatica.    Referring Provider (PT)  Sanjuana Kava MD.    Onset Date/Surgical Date  01/06/18      Precautions   Precautions  None      Restrictions   Weight Bearing Restrictions  No                   [facility] Adult PT Treatment/Exercise - 02/12/18 0001      Modalities  Modalities  Electrical Stimulation;Moist Heat;Ultrasound      Moist Heat Therapy   Number Minutes Moist Heat  15 Minutes    Moist Heat Location  Lumbar Spine      Electrical Stimulation   Electrical Stimulation Location  R lumbar paraspinals    Electrical Stimulation Action  Pre-Mod    Electrical Stimulation Parameters  80-150 hz x15 min    Electrical Stimulation Goals  Pain      Ultrasound   Ultrasound Location  R lumbar paraspinals    Ultrasound Parameters  1.5 w/cm2, 100%, 1 mhz x10 min    Ultrasound Goals  Pain      Manual Therapy   Manual Therapy  Soft tissue mobilization    Soft tissue mobilization  STW to R lumbar paraspinals/ QL to reduce pain                   PT Long Term Goals - 02/04/18 1423      PT LONG TERM GOAL #1   Title  Independent with a HEP.    Time  4    Period  Weeks    Status  On-going      PT LONG TERM GOAL #2   Title  Stand 30 minutes with pain not > 2-3/10.    Time  4    Period  Weeks    Status  On-going      PT LONG TERM GOAL #3   Title  Sit 30 minutes with pain not > 2-3/10.    Time  4    Period  Weeks    Status  On-going      PT LONG TERM GOAL #4    Title  Perform ADL's with pain not > 2-3/10.    Time  4    Period  Weeks    Status  On-going            Plan - 02/12/18 1559    Clinical Impression Statement  Patient presented in clinic with reports of increased pain especially after riding in her brother's truck yesterday. Patient did not report any tenderness with manual therapy to R lumbar paraspinals or QL. Minimal increased tightness of R lumbar paraspinals noted during manual therapy. Normal modalities response noted following removal of the modalities.    Rehab Potential  Excellent    PT Treatment/Interventions  ADLs/Self Care Home Management;Cryotherapy;Electrical Stimulation;Ultrasound;Moist Heat;Therapeutic activities;Therapeutic exercise;Patient/family education;Manual techniques;Dry needling    PT Next Visit Plan  Combo e'stim/U/S; STW/M; HMP and electrical stimulation.  S and DKTC; hip bridges; progress into to core exercise program.    Consulted and Agree with Plan of Care  Patient       Patient will benefit from skilled therapeutic intervention in order to improve the following deficits and impairments:  Pain, Decreased activity tolerance, Decreased range of motion, Increased muscle spasms  Visit Diagnosis: Acute right-sided low back pain without sciatica  Other abnormalities of gait and mobility  Localized edema     Problem List Patient Active Problem List   Diagnosis Date Noted  . IGT (impaired glucose tolerance) 07/21/2017  . Chronic right shoulder pain 02/19/2017  . Hx of colonic polyps 04/05/2016  . Hyperlipidemia 08/30/2012  . Metabolic syndrome X 77/82/4235  . OA (osteoarthritis) of knee 01/22/2012  . Allergic rhinitis 07/21/2010  . Cough variant asthma 11/29/2009  . Obesity 01/08/2009  . Hypothyroidism 08/23/2008  . Depression with anxiety 08/23/2008  . Essential hypertension 08/23/2008    Standley Brooking,  PTA 02/12/2018, 4:42 PM  [facility] [address] [place], [place], [address] Phone: [phone]   Fax:  [phone]  Name: Rebecca Rodgers MRN: [id number] Date of Birth: 04-20-1955

## 2018-02-17 ENCOUNTER — Ambulatory Visit: Payer: Medicare HMO | Admitting: Physical Therapy

## 2018-02-17 ENCOUNTER — Other Ambulatory Visit: Payer: Self-pay | Admitting: Orthopaedic Surgery

## 2018-02-17 ENCOUNTER — Encounter: Payer: Self-pay | Admitting: Physical Therapy

## 2018-02-17 DIAGNOSIS — R2689 Other abnormalities of gait and mobility: Secondary | ICD-10-CM

## 2018-02-17 DIAGNOSIS — R600 Localized edema: Secondary | ICD-10-CM | POA: Diagnosis not present

## 2018-02-17 DIAGNOSIS — M545 Low back pain, unspecified: Secondary | ICD-10-CM

## 2018-02-17 NOTE — Therapy (Signed)
[facility] [place], [place], [address] Phone: [phone]   Fax:  [phone]  Physical Therapy Treatment  Patient Details  Name: Rebecca Rodgers MRN: [id number] Date of Birth: Aug 24, 1955 Referring Provider (PT): Sanjuana Kava MD.   Encounter Date: 02/17/2018  PT End of Session - 02/17/18 1238    Visit Number  6    Number of Visits  12    Date for PT Re-Evaluation  02/23/18    PT Start Time  1115    PT Stop Time  1209    PT Time Calculation (min)  54 min    Activity Tolerance  Patient tolerated treatment well    Behavior During Therapy  [facility] for tasks assessed/performed       Past Medical History:  Diagnosis Date  . Asthma   . Hx of hysterectomy   . Hyperlipidemia   . Hypertension   . Hypothyroidism   . Suicide attempt ([place]) 1997   when a relationship broke up    . Suicide attempt ([place])    3 months ago because of ill health of her parents   . Uterine cancer ([place]) 2005    Past Surgical History:  Procedure Laterality Date  . ABDOMINAL HYSTERECTOMY    . CARPAL TUNNEL RELEASE  1998   bilateral    . COLONOSCOPY  12/20/2010   Procedure: COLONOSCOPY;  Surgeon: Rogene Houston, MD;  [facility];  Service: Endoscopy;  Laterality: N/A;  . COLONOSCOPY N/A 06/27/2016   Procedure: COLONOSCOPY;  Surgeon: Rogene Houston, MD;  [facility];  Service: Endoscopy;  Laterality: N/A;  1030  . DILATION AND CURETTAGE OF UTERUS     at 20   . VESICOVAGINAL FISTULA CLOSURE W/ TAH  2002   dur to uterine cancer     There were no vitals filed for this visit.  Subjective Assessment - 02/17/18 1238    Subjective  Feeling some better with pain at 6 today.  Drove for over another yesterday and didn't have as much pain.    Pertinent History  Ankle fracture.    Limitations  Sitting    How long can you sit comfortably?  20 minutes.    How long can you stand comfortably?  10 minutes (ie:  Doing dishes).    Diagnostic tests  X-ray revealed:  Mild degenerative changes of the lumbar spine, no acute findings.    Patient Stated Goals  Get out of pain like before accident.    Currently in Pain?  Yes    Pain Score  6     Pain Location  Back    Pain Orientation  Right;Lower    Pain Descriptors / Indicators  Discomfort    Pain Onset  1 to 4 weeks ago                       [facility] Adult PT Treatment/Exercise - 02/17/18 0001      Exercises   Exercises  Knee/Hip      Knee/Hip Exercises: Aerobic   Nustep  Level 3 x 10 minutes.      Modalities   Modalities  Electrical Stimulation;Moist Heat;Ultrasound      Moist Heat Therapy   Number Minutes Moist Heat  20 Minutes    Moist Heat Location  Lumbar Spine      Electrical Stimulation   Electrical Stimulation Location  Right upper gluteal region.    Chartered certified accountant  IFC    Electrical Stimulation Parameters  80-150 Hz x 20 minutes.    Electrical Stimulation Goals  Tone;Pain      Ultrasound   Ultrasound Location  Right upper gluteal region.    Ultrasound Parameters  U/S at 1.50 W/CM2 x 13 minutes.    Ultrasound Goals  Pain                  PT Long Term Goals - 02/04/18 1423      PT LONG TERM GOAL #1   Title  Independent with a HEP.    Time  4    Period  Weeks    Status  On-going      PT LONG TERM GOAL #2   Title  Stand 30 minutes with pain not > 2-3/10.    Time  4    Period  Weeks    Status  On-going      PT LONG TERM GOAL #3   Title  Sit 30 minutes with pain not > 2-3/10.    Time  4    Period  Weeks    Status  On-going      PT LONG TERM GOAL #4   Title  Perform ADL's with pain not > 2-3/10.    Time  4    Period  Weeks    Status  On-going            Plan - 02/17/18 1243    Clinical Impression Statement  The patient reports improvement as she was better able to tolerate a long drive yesterday.  Her CC was pain in the right upper gluteal region today and less pain reported in the  right low back today.      PT Next Visit Plan  Progress with core exercises.    Consulted and Agree with Plan of Care  Patient       Patient will benefit from skilled therapeutic intervention in order to improve the following deficits and impairments:  Pain, Decreased activity tolerance, Decreased range of motion, Increased muscle spasms  Visit Diagnosis: Acute right-sided low back pain without sciatica  Localized edema  Other abnormalities of gait and mobility     Problem List Patient Active Problem List   Diagnosis Date Noted  . IGT (impaired glucose tolerance) 07/21/2017  . Chronic right shoulder pain 02/19/2017  . Hx of colonic polyps 04/05/2016  . Hyperlipidemia 08/30/2012  . Metabolic syndrome X 23/55/7322  . OA (osteoarthritis) of knee 01/22/2012  . Allergic rhinitis 07/21/2010  . Cough variant asthma 11/29/2009  . Obesity 01/08/2009  . Hypothyroidism 08/23/2008  . Depression with anxiety 08/23/2008  . Essential hypertension 08/23/2008    Encarnacion Bole, [place] MPT 02/17/2018, 12:45 PM  [facility] [address] [place], [place], [address] Phone: [phone]   Fax:  [phone]  Name: Rebecca Rodgers MRN: [id number] Date of Birth: 16-Jan-1956

## 2018-02-23 ENCOUNTER — Ambulatory Visit (INDEPENDENT_AMBULATORY_CARE_PROVIDER_SITE_OTHER): Payer: Medicare HMO | Admitting: Family Medicine

## 2018-02-23 ENCOUNTER — Other Ambulatory Visit (HOSPITAL_COMMUNITY)
Admission: RE | Admit: 2018-02-23 | Discharge: 2018-02-23 | Disposition: A | Discharge status: Home / Self Care | Payer: Medicare HMO | Source: Ambulatory Visit | Attending: Family Medicine | Admitting: Family Medicine

## 2018-02-23 ENCOUNTER — Encounter: Payer: Self-pay | Admitting: Family Medicine

## 2018-02-23 VITALS — BP 126/68 | HR 92 | Resp 12 | Ht 63.0 in | Wt 164.0 lb

## 2018-02-23 DIAGNOSIS — Z0000 Encounter for general adult medical examination without abnormal findings: Secondary | ICD-10-CM

## 2018-02-23 DIAGNOSIS — E785 Hyperlipidemia, unspecified: Secondary | ICD-10-CM

## 2018-02-23 DIAGNOSIS — F418 Other specified anxiety disorders: Secondary | ICD-10-CM

## 2018-02-23 DIAGNOSIS — R0989 Other specified symptoms and signs involving the circulatory and respiratory systems: Secondary | ICD-10-CM | POA: Diagnosis not present

## 2018-02-23 DIAGNOSIS — Z23 Encounter for immunization: Secondary | ICD-10-CM

## 2018-02-23 DIAGNOSIS — E8881 Metabolic syndrome: Secondary | ICD-10-CM

## 2018-02-23 DIAGNOSIS — E038 Other specified hypothyroidism: Secondary | ICD-10-CM

## 2018-02-23 DIAGNOSIS — R413 Other amnesia: Secondary | ICD-10-CM | POA: Diagnosis not present

## 2018-02-23 DIAGNOSIS — R7301 Impaired fasting glucose: Secondary | ICD-10-CM

## 2018-02-23 DIAGNOSIS — I10 Essential (primary) hypertension: Secondary | ICD-10-CM

## 2018-02-23 DIAGNOSIS — E559 Vitamin D deficiency, unspecified: Secondary | ICD-10-CM

## 2018-02-23 NOTE — Patient Instructions (Addendum)
F/u in 5 monhts, call if you need before  Pap sent today  Flu vaccine today  Hope you feel better  Congrats on weight loss and remaining nicotine free for 5 years  Please schedule appt with your Psychiatrist as your grief is affecting your function  I will refer you to Neurologist re c/o near syncope and memory loss progressing over the past 1 year, Dr Merlene Laughter  Will add  B12 and TSH to lab order  It is important that you exercise regularly at least 30 minutes 5 times a week. If you develop chest pain, have severe difficulty breathing, or feel very tired, stop exercising immediately and seek medical attention    Thank you  for choosing [facility]. We consider it a privelige to serve you.  Delivering excellent health care in a caring and  compassionate way is our goal.  Partnering with you,  so that together we can achieve this goal is our strategy.

## 2018-02-23 NOTE — Progress Notes (Signed)
Rebecca Rodgers     MRN: [id number]      DOB: 08/25/1955  HPI: Patient is in for annual physical exam.  Recent labs, if available are reviewed. Immunization is reviewed , and is given 1 year h/o near syncopal episodes, absence events , on average every 2 months, seems to be progressing over the past year, also c/o memory loss. Has had occurrences even while driving, mind tends to wander ruminates over her partner, also experiencing shaking episodes and stuttering excessively.States she keeps her bills and bill payment in order Unresolved/ suppressed grief over loss of her partner of over 10 years 1 year ago, needs to increase therapy sessions, she is not suicidal or homicidal   PE: BP 126/68 (BP Location: Right Arm, Patient Position: Sitting, Cuff Size: Large)   Pulse 92   Resp 12   Ht 5\' 3"  (1.6 m)   Wt 164 lb 0.6 oz (74.4 kg)   SpO2 97% Comment: room air  BMI 29.06 kg/m   Pleasant  female, alert and oriented x 3, in no cardio-pulmonary distress. .Tearful at times HEENT No facial trauma or asymetry. Sinuses non tender.  Extra occullar muscles intact, . External ears normal, tympanic membranes clear. Oropharynx moist, no exudate. Neck: supple, no adenopathy,JVD or thyromegaly. Bruit present  Chest: Clear to ascultation bilaterally.No crackles or wheezes. Non tender to palpation  Breast: No asymetry,no masses or lumps. No tenderness. No nipple discharge or inversion. No axillary or supraclavicular adenopathy  Cardiovascular system; Heart sounds normal,  S1 and  S2 ,no S3.  No murmur, or thrill. Apical beat not displaced Peripheral pulses normal.  Abdomen: Soft, non tender, no organomegaly or masses. No bruits. Bowel sounds normal. No guarding, tenderness or rebound.    GU: External genitalia normal female genitalia , normal female distribution of hair. No lesions. Urethral meatus normal in size, no  Prolapse, no lesions visibly  Present. Bladder non  tender. Vagina pink and moist , with no visible lesions , discharge present . Adequate pelvic support no  cystocele or rectocele noted  Uterus absent no adnexal masses, no  adnexal tenderness.   Musculoskeletal exam: Full ROM of spine, hips , shoulders and knees. No deformity ,swelling or crepitus noted. No muscle wasting or atrophy.   Neurologic: Cranial nerves 2 to 12 intact. Power, tone ,sensation and reflexes normal throughout. No disturbance in gait. No tremor.  Skin: Intact, no ulceration, erythema , scaling or rash noted. Pigmentation normal throughout  Psych; Mildly depressed  mood and affect. Judgement and concentration normal   Assessment & Plan:  Annual physical exam Annual exam as documented. Counseling done  re healthy lifestyle involving commitment to 150 minutes exercise per week, heart healthy diet, and attaining healthy weight.The importance of adequate sleep also discussed.  Changes in health habits are decided on by the patient with goals and time frames  set for achieving them. Immunization and cancer screening needs are specifically addressed at this visit.   Depression with anxiety Uncontrolled not suicidal or homicidal, however prolonged grief with somatic symptoms of anxiety following loss of her partner 1 year ago, advised that she increase therapy sessions, and she agrees  Episodic memory loss Reports increasing episodes of memory loss and near syncope in the past 1 year, will add B12 and TSH to lab order, check [place] of carotid artery and refer to Neurology  Carotid bruit present Recurrent near syncope x 1 year with reported memory loss, needs carotid doppler and refer to Neurology

## 2018-02-24 DIAGNOSIS — E785 Hyperlipidemia, unspecified: Secondary | ICD-10-CM | POA: Diagnosis not present

## 2018-02-24 DIAGNOSIS — R7301 Impaired fasting glucose: Secondary | ICD-10-CM | POA: Diagnosis not present

## 2018-02-24 DIAGNOSIS — E559 Vitamin D deficiency, unspecified: Secondary | ICD-10-CM | POA: Diagnosis not present

## 2018-02-24 DIAGNOSIS — R413 Other amnesia: Secondary | ICD-10-CM | POA: Diagnosis not present

## 2018-02-24 DIAGNOSIS — E8881 Metabolic syndrome: Secondary | ICD-10-CM | POA: Diagnosis not present

## 2018-02-24 DIAGNOSIS — I10 Essential (primary) hypertension: Secondary | ICD-10-CM | POA: Diagnosis not present

## 2018-02-25 ENCOUNTER — Ambulatory Visit: Payer: Medicare HMO | Attending: Orthopaedic Surgery | Admitting: Physical Therapy

## 2018-02-25 ENCOUNTER — Encounter: Payer: Self-pay | Admitting: Physical Therapy

## 2018-02-25 DIAGNOSIS — R600 Localized edema: Secondary | ICD-10-CM | POA: Insufficient documentation

## 2018-02-25 DIAGNOSIS — R2689 Other abnormalities of gait and mobility: Secondary | ICD-10-CM | POA: Insufficient documentation

## 2018-02-25 DIAGNOSIS — M545 Low back pain, unspecified: Secondary | ICD-10-CM

## 2018-02-25 LAB — COMPLETE METABOLIC PANEL WITH GFR
AG Ratio: 1.6 (calc) (ref 1.0–2.5)
ALKALINE PHOSPHATASE (APISO): 66 U/L (ref 33–130)
ALT: 15 U/L (ref 6–29)
AST: 18 U/L (ref 10–35)
Albumin: 4.6 g/dL (ref 3.6–5.1)
BILIRUBIN TOTAL: 0.3 mg/dL (ref 0.2–1.2)
BUN/Creatinine Ratio: 14 (calc) (ref 6–22)
BUN: 16 mg/dL (ref 7–25)
CALCIUM: 10.3 mg/dL (ref 8.6–10.4)
CHLORIDE: 101 mmol/L (ref 98–110)
CO2: 25 mmol/L (ref 20–32)
Creat: 1.15 mg/dL — ABNORMAL HIGH (ref 0.50–0.99)
GFR, Est African American: 59 mL/min/{1.73_m2} — ABNORMAL LOW (ref >=60)
GFR, Est Non African American: 51 mL/min/{1.73_m2} — ABNORMAL LOW (ref >=60)
GLOBULIN: 2.8 g/dL (ref 1.9–3.7)
Glucose, Bld: 89 mg/dL (ref 65–99)
POTASSIUM: 3.5 mmol/L (ref 3.5–5.3)
SODIUM: 137 mmol/L (ref 135–146)
Total Protein: 7.4 g/dL (ref 6.1–8.1)

## 2018-02-25 LAB — HEMOGLOBIN A1C
HEMOGLOBIN A1C: 5.9 %{Hb} — AB (ref <5.7)
MEAN PLASMA GLUCOSE: 123 (calc)
eAG (mmol/L): 6.8 (calc)

## 2018-02-25 LAB — CBC
HEMATOCRIT: 39.9 % (ref 35.0–45.0)
HEMOGLOBIN: 13.4 g/dL (ref 11.7–15.5)
MCH: 29.5 pg (ref 27.0–33.0)
MCHC: 33.6 g/dL (ref 32.0–36.0)
MCV: 87.7 fL (ref 80.0–100.0)
MPV: 10.6 fL (ref 7.5–12.5)
Platelets: 359 10*3/uL (ref 140–400)
RBC: 4.55 10*6/uL (ref 3.80–5.10)
RDW: 14 % (ref 11.0–15.0)
WBC: 7.2 10*3/uL (ref 3.8–10.8)

## 2018-02-25 LAB — VITAMIN B12: Vitamin B-12: 484 pg/mL (ref 200–1100)

## 2018-02-25 LAB — LIPID PANEL
CHOLESTEROL: 181 mg/dL (ref <200)
HDL: 69 mg/dL (ref >50)
LDL Cholesterol (Calc): 95 mg/dL (calc)
NON-HDL CHOLESTEROL (CALC): 112 mg/dL (ref <130)
TRIGLYCERIDES: 77 mg/dL (ref <150)
Total CHOL/HDL Ratio: 2.6 (calc) (ref <5.0)

## 2018-02-25 LAB — TSH: TSH: 2.25 mIU/L (ref 0.40–4.50)

## 2018-02-25 LAB — VITAMIN D 25 HYDROXY (VIT D DEFICIENCY, FRACTURES): Vit D, 25-Hydroxy: 48 ng/mL (ref 30–100)

## 2018-02-25 NOTE — Therapy (Signed)
[facility] [place], [place], [address] Phone: [phone]   Fax:  [phone]  Physical Therapy Treatment  Patient Details  Name: Rebecca Rodgers MRN: [id number] Date of Birth: Aug 11, 1955 Referring Provider (PT): Sanjuana Kava MD.   Encounter Date: 02/25/2018  PT End of Session - 02/25/18 1426    Visit Number  7    Number of Visits  12    Date for PT Re-Evaluation  02/23/18    PT Start Time  0103    PT Stop Time  0140    PT Time Calculation (min)  37 min    Activity Tolerance  Patient tolerated treatment well    Behavior During Therapy  [facility] for tasks assessed/performed       Past Medical History:  Diagnosis Date  . Asthma   . Hx of hysterectomy   . Hyperlipidemia   . Hypertension   . Hypothyroidism   . Suicide attempt ([place]) 1997   when a relationship broke up    . Suicide attempt ([place])    3 months ago because of ill health of her parents   . Uterine cancer ([place]) 2005    Past Surgical History:  Procedure Laterality Date  . ABDOMINAL HYSTERECTOMY    . CARPAL TUNNEL RELEASE  1998   bilateral    . COLONOSCOPY  12/20/2010   Procedure: COLONOSCOPY;  Surgeon: Rogene Houston, MD;  [facility];  Service: Endoscopy;  Laterality: N/A;  . COLONOSCOPY N/A 06/27/2016   Procedure: COLONOSCOPY;  Surgeon: Rogene Houston, MD;  [facility];  Service: Endoscopy;  Laterality: N/A;  1030  . DILATION AND CURETTAGE OF UTERUS     at 20   . VESICOVAGINAL FISTULA CLOSURE W/ TAH  2002   dur to uterine cancer     There were no vitals filed for this visit.  Subjective Assessment - 02/25/18 1427    Subjective  I'm getting better.  My pain is a 4 today.    Pertinent History  Ankle fracture.    Limitations  Sitting    How long can you sit comfortably?  20 minutes.    How long can you stand comfortably?  10 minutes (ie:  Doing dishes).    Diagnostic tests  X-ray revealed:  Mild degenerative changes of the  lumbar spine, no acute findings.    Patient Stated Goals  Get out of pain like before accident.    Currently in Pain?  Yes    Pain Score  4     Pain Location  Back    Pain Orientation  Right;Lower    Pain Descriptors / Indicators  Discomfort    Pain Type  Acute pain    Pain Onset  1 to 4 weeks ago                       [facility] Adult PT Treatment/Exercise - 02/25/18 0001      Exercises   Exercises  Lumbar;Knee/Hip      Lumbar Exercises: Standing   Other Standing Lumbar Exercises  Red XTS scapular retraction to fatigue with core activation via a "draw-in."      Knee/Hip Exercises: Aerobic   Nustep  Level 3 x 13 minutes.      Modalities   Modalities  Electrical Stimulation;Moist Heat      Moist Heat Therapy   Number Minutes Moist Heat  15 Minutes    Moist Heat Location  Lumbar Spine      Electrical Stimulation   Electrical Stimulation Location  Right QL and right upper glut.    Electrical Stimulation Action  Pre-mod to each region.    Electrical Stimulation Parameters  80-150 Hz x 15 minutes.    Electrical Stimulation Goals  Tone;Pain      Manual Therapy   Manual Therapy  Soft tissue mobilization    Soft tissue mobilization  TP release to right QL and upper glut...2 minutes to each area (performed ischemic release technique.                  PT Long Term Goals - 02/04/18 1423      PT LONG TERM GOAL #1   Title  Independent with a HEP.    Time  4    Period  Weeks    Status  On-going      PT LONG TERM GOAL #2   Title  Stand 30 minutes with pain not > 2-3/10.    Time  4    Period  Weeks    Status  On-going      PT LONG TERM GOAL #3   Title  Sit 30 minutes with pain not > 2-3/10.    Time  4    Period  Weeks    Status  On-going      PT LONG TERM GOAL #4   Title  Perform ADL's with pain not > 2-3/10.    Time  4    Period  Weeks    Status  On-going            Plan - 02/25/18 1447    Clinical Impression Statement  Patient  responding very well to treatments with a significant reduction in pain since beginning PT.  There was palpable less tone in affected musculature as well today.    PT Treatment/Interventions  ADLs/Self Care Home Management;Cryotherapy;Electrical Stimulation;Ultrasound;Moist Heat;Therapeutic activities;Therapeutic exercise;Patient/family education;Manual techniques;Dry needling    PT Next Visit Plan  Progress with core exercises.    Consulted and Agree with Plan of Care  Patient       Patient will benefit from skilled therapeutic intervention in order to improve the following deficits and impairments:  Pain, Decreased activity tolerance, Decreased range of motion, Increased muscle spasms  Visit Diagnosis: Acute right-sided low back pain without sciatica  Localized edema  Other abnormalities of gait and mobility     Problem List Patient Active Problem List   Diagnosis Date Noted  . IGT (impaired glucose tolerance) 07/21/2017  . Chronic right shoulder pain 02/19/2017  . Hx of colonic polyps 04/05/2016  . Hyperlipidemia 08/30/2012  . Metabolic syndrome X 21/19/4174  . OA (osteoarthritis) of knee 01/22/2012  . Allergic rhinitis 07/21/2010  . Cough variant asthma 11/29/2009  . Obesity 01/08/2009  . Hypothyroidism 08/23/2008  . Depression with anxiety 08/23/2008  . Essential hypertension 08/23/2008    Rashaad Hallstrom, [place] MPT 02/25/2018, 2:50 PM  [facility] [address] [place], [place], [address] Phone: [phone]   Fax:  [phone]  Name: Rebecca Rodgers MRN: [id number] Date of Birth: 30-Mar-1955

## 2018-02-26 ENCOUNTER — Ambulatory Visit: Payer: Medicare HMO | Admitting: Orthopaedic Surgery

## 2018-02-26 ENCOUNTER — Encounter: Payer: Self-pay | Admitting: Orthopaedic Surgery

## 2018-02-26 VITALS — BP 120/75 | HR 105 | Ht 63.0 in | Wt 160.0 lb

## 2018-02-26 DIAGNOSIS — M545 Low back pain, unspecified: Secondary | ICD-10-CM

## 2018-02-26 LAB — CYTOLOGY - PAP
Diagnosis: NEGATIVE
HPV: NOT DETECTED

## 2018-02-26 NOTE — Progress Notes (Signed)
Patient Rebecca Rodgers, female DOB:[birth year], 62 y.o. LEX:[id number]  Chief Complaint  Patient presents with  . Back Pain    HPI  Rebecca Rodgers is a 62 y.o. female who has lower back pain.  She is improved.  She had a bad day this past Saturday shopping all day and the pain was worse but since then she has much less pain.  She is going to PT.  She is taking her medicine.  She is doing her exercises.   Body mass index is 28.34 kg/m.  ROS  Review of Systems  Constitutional: Positive for activity change.  Respiratory: Positive for shortness of breath. Negative for cough.   Musculoskeletal: Positive for arthralgias and back pain.  All other systems reviewed and are negative.   All other systems reviewed and are negative.  The following is a summary of the past history medically, past history surgically, known current medicines, social history and family history.  This information is gathered electronically by the computer from prior information and documentation.  I review this each visit and have found including this information at this point in the chart is beneficial and informative.    Past Medical History:  Diagnosis Date  . Asthma   . Hx of hysterectomy   . Hyperlipidemia   . Hypertension   . Hypothyroidism   . Suicide attempt ([place]) 1997   when a relationship broke up    . Suicide attempt ([place])    3 months ago because of ill health of her parents   . Uterine cancer ([place]) 2005    Past Surgical History:  Procedure Laterality Date  . ABDOMINAL HYSTERECTOMY    . CARPAL TUNNEL RELEASE  1998   bilateral    . COLONOSCOPY  12/20/2010   Procedure: COLONOSCOPY;  Surgeon: Rogene Houston, MD;  [facility];  Service: Endoscopy;  Laterality: N/A;  . COLONOSCOPY N/A 06/27/2016   Procedure: COLONOSCOPY;  Surgeon: Rogene Houston, MD;  [facility];  Service: Endoscopy;  Laterality: N/A;  1030  . DILATION AND CURETTAGE OF UTERUS     at 20   .  VESICOVAGINAL FISTULA CLOSURE W/ TAH  2002   dur to uterine cancer     Family History  Problem Relation Age of Onset  . Cataracts Mother   . Hypertension Mother   . Hyperlipidemia Mother   . Dementia Mother   . Depression Mother   . Coronary artery disease Father   . Hyperlipidemia Father   . Stroke Father   . Cancer Sister 35       breast   . Cancer Brother 75       prostate    Social History Social History   Tobacco Use  . Smoking status: Former Smoker    Packs/day: 0.25    Years: 15.00    Pack years: 3.75    Types: Cigarettes    Last attempt to quit: 02/08/2015    Years since quitting: 3.0  . Smokeless tobacco: Former Systems developer    Types: Chew    Quit date: 02/18/2016  Substance Use Topics  . Alcohol use: No    Alcohol/week: 0.0 standard drinks  . Drug use: No    Allergies  Allergen Reactions  . Codeine     Lip swelling   . Penicillins     Lip swelling Has patient had a PCN reaction causing immediate rash, facial/tongue/throat swelling, SOB or lightheadedness with hypotension: Yes Has patient had a PCN  reaction causing severe rash involving mucus membranes or skin necrosis: No Has patient had a PCN reaction that required hospitalization No Has patient had a PCN reaction occurring within the last 10 years: No If all of the above answers are "NO", then may proceed with Cephalosporin use.     Current Outpatient Medications  Medication Sig Dispense Refill  . amLODipine (NORVASC) 10 MG tablet TAKE 1 TABLET DAILY 90 tablet 2  . buPROPion (WELLBUTRIN XL) 150 MG 24 hr tablet Take 150 mg by mouth daily.    . cholecalciferol (VITAMIN D) 1000 units tablet Take 2,000 Units by mouth every Tuesday.     . clotrimazole-betamethasone (LOTRISONE) cream APPLY TO AFFECTED AREAS TWICE A DAY 45 g 0  . cyclobenzaprine (FLEXERIL) 10 MG tablet TAKE 1 TABLET AT BEDTIME AS NEEDED FOR SPASM 30 tablet 0  . ezetimibe (ZETIA) 10 MG tablet Take 1 tablet (10 mg total) by mouth daily. 90  tablet 3  . ibuprofen (ADVIL,MOTRIN) 800 MG tablet Take one po tid x 10 days    . lamoTRIgine (LAMICTAL) 200 MG tablet Take 1 tablet (200 mg total) by mouth daily. 30 tablet 2  . levothyroxine (SYNTHROID, LEVOTHROID) 50 MCG tablet TAKE 1 TABLET DAILY MON-SAT AND 1&1/2 TABLETS ON SUN 100 tablet 2  . montelukast (SINGULAIR) 10 MG tablet Take 1 tablet (10 mg total) by mouth at bedtime. 90 tablet 3  . OLANZapine (ZYPREXA) 5 MG tablet Take 1 tablet (5 mg total) by mouth at bedtime. 30 tablet 2  . omeprazole (PRILOSEC) 20 MG capsule Take 20 mg by mouth daily.     . rosuvastatin (CRESTOR) 40 MG tablet TAKE 1 TABLET DAILY AT BEDTIME FOR CHOLESTEROL 90 tablet 0  . SYMBICORT 160-4.5 MCG/ACT inhaler 2 PUFFS 2 TIMES A DAY 30.6 g 1  . traZODone (DESYREL) 100 MG tablet Take 1 tablet (100 mg total) by mouth at bedtime. 30 tablet 2  . triamterene-hydrochlorothiazide (MAXZIDE-25) 37.5-25 MG tablet Take 1 tablet by mouth daily. 90 tablet 0  . VOLTAREN 1 % GEL APPLY TO THE AFFECTED AREA(S) TWICE A DAY AS NEEDED FOR PAIN 100 g 0  . albuterol (PROVENTIL HFA;VENTOLIN HFA) 108 (90 Base) MCG/ACT inhaler Inhale 2 puffs into the lungs every 6 (six) hours as needed for wheezing or shortness of breath. (Patient not taking: Reported on 02/26/2018) 1 Inhaler 0   No current facility-administered medications for this visit.      Physical Exam  Blood pressure 120/75, pulse (!) 105, height 5\' 3"  (1.6 m), weight 160 lb (72.6 kg).  Constitutional: overall normal hygiene, normal nutrition, well developed, normal grooming, normal body habitus. Assistive device:none  Musculoskeletal: gait and station Limp none, muscle tone and strength are normal, no tremors or atrophy is present.  .  Neurological: coordination overall normal.  Deep tendon reflex/nerve stretch intact.  Sensation normal.  Cranial nerves II-XII intact.   Skin:   Normal overall no scars, lesions, ulcers or rashes. No psoriasis.  Psychiatric: Alert and oriented  x 3.  Recent memory intact, remote memory unclear.  Normal mood and affect. Well groomed.  Good eye contact.  Cardiovascular: overall no swelling, no varicosities, no edema bilaterally, normal temperatures of the legs and arms, no clubbing, cyanosis and good capillary refill.  Lymphatic: palpation is normal.  Spine/Pelvis examination:  Inspection:  Overall, sacoiliac joint benign and hips nontender; without crepitus or defects.   Thoracic spine inspection: Alignment normal without kyphosis present   Lumbar spine inspection:  Alignment  with normal  lumbar lordosis, without scoliosis apparent.   Thoracic spine palpation:  without tenderness of spinal processes   Lumbar spine palpation: without tenderness of lumbar area; without tightness of lumbar muscles    Range of Motion:   Lumbar flexion, forward flexion is normal without pain or tenderness    Lumbar extension is full without pain or tenderness   Left lateral bend is normal without pain or tenderness   Right lateral bend is normal without pain or tenderness   Straight leg raising is normal  Strength & tone: normal   Stability overall normal stability  All other systems reviewed and are negative   The patient has been educated about the nature of the problem(s) and counseled on treatment options.  The patient appeared to understand what I have discussed and is in agreement with it.  Encounter Diagnosis  Name Primary?  . Acute midline low back pain without sciatica Yes    PLAN Call if any problems.  Precautions discussed.  Continue current medications.   Return to clinic 1 month   Complete PT.  Electronically Signed Sanjuana Kava, MD 12/5/201910:17 AM

## 2018-03-01 ENCOUNTER — Encounter: Payer: Self-pay | Admitting: Family Medicine

## 2018-03-01 DIAGNOSIS — R413 Other amnesia: Secondary | ICD-10-CM | POA: Insufficient documentation

## 2018-03-01 DIAGNOSIS — R0989 Other specified symptoms and signs involving the circulatory and respiratory systems: Secondary | ICD-10-CM | POA: Insufficient documentation

## 2018-03-01 NOTE — Assessment & Plan Note (Addendum)
Uncontrolled not suicidal or homicidal, however prolonged grief with somatic symptoms of anxiety following loss of her partner 1 year ago, advised that she increase therapy sessions, and she agrees

## 2018-03-01 NOTE — Assessment & Plan Note (Signed)
Reports increasing episodes of memory loss and near syncope in the past 1 year, will add B12 and TSH to lab order, check [place] of carotid artery and refer to Neurology

## 2018-03-01 NOTE — Assessment & Plan Note (Signed)
Recurrent near syncope x 1 year with reported memory loss, needs carotid doppler and refer to Neurology

## 2018-03-01 NOTE — Assessment & Plan Note (Signed)
Annual exam as documented. Counseling done  re healthy lifestyle involving commitment to 150 minutes exercise per week, heart healthy diet, and attaining healthy weight.The importance of adequate sleep also discussed. Changes in health habits are decided on by the patient with goals and time frames  set for achieving them. Immunization and cancer screening needs are specifically addressed at this visit. 

## 2018-03-02 ENCOUNTER — Encounter: Payer: Self-pay | Admitting: Family Medicine

## 2018-03-03 ENCOUNTER — Ambulatory Visit: Payer: Medicare HMO | Admitting: Physical Therapy

## 2018-03-03 ENCOUNTER — Encounter: Payer: Self-pay | Admitting: Physical Therapy

## 2018-03-03 ENCOUNTER — Other Ambulatory Visit: Payer: Self-pay | Admitting: Family Medicine

## 2018-03-03 DIAGNOSIS — M545 Low back pain, unspecified: Secondary | ICD-10-CM

## 2018-03-03 DIAGNOSIS — R2689 Other abnormalities of gait and mobility: Secondary | ICD-10-CM

## 2018-03-03 DIAGNOSIS — R600 Localized edema: Secondary | ICD-10-CM

## 2018-03-03 NOTE — Therapy (Signed)
[facility] [place], [place], [address] Phone: [phone]   Fax:  [phone]  Physical Therapy Treatment  Patient Details  Name: Rebecca Rodgers MRN: [id number] Date of Birth: May 13, 1955 Referring Provider (PT): Sanjuana Kava MD.   Encounter Date: 03/03/2018  PT End of Session - 03/03/18 1338    Visit Number  8    Number of Visits  16    Date for PT Re-Evaluation  03/30/18    PT Start Time  0102    PT Stop Time  0154    PT Time Calculation (min)  52 min       Past Medical History:  Diagnosis Date  . Asthma   . Hx of hysterectomy   . Hyperlipidemia   . Hypertension   . Hypothyroidism   . Suicide attempt ([place]) 1997   when a relationship broke up    . Suicide attempt ([place])    3 months ago because of ill health of her parents   . Uterine cancer ([place]) 2005    Past Surgical History:  Procedure Laterality Date  . ABDOMINAL HYSTERECTOMY    . CARPAL TUNNEL RELEASE  1998   bilateral    . COLONOSCOPY  12/20/2010   Procedure: COLONOSCOPY;  Surgeon: Rogene Houston, MD;  [facility];  Service: Endoscopy;  Laterality: N/A;  . COLONOSCOPY N/A 06/27/2016   Procedure: COLONOSCOPY;  Surgeon: Rogene Houston, MD;  [facility];  Service: Endoscopy;  Laterality: N/A;  1030  . DILATION AND CURETTAGE OF UTERUS     at 20   . VESICOVAGINAL FISTULA CLOSURE W/ TAH  2002   dur to uterine cancer     There were no vitals filed for this visit.  Subjective Assessment - 03/03/18 1310    Subjective  Dr. pleased with my progress.  Wouldlike me to continue until I see her on the 7th of next month.  My pain is low today.    Pertinent History  Ankle fracture.    Limitations  Sitting    How long can you sit comfortably?  20 minutes.    How long can you stand comfortably?  10 minutes (ie:  Doing dishes).    Diagnostic tests  X-ray revealed:  Mild degenerative changes of the lumbar spine, no acute findings.    Patient Stated Goals  Get out of pain like before accident.    Currently in Pain?  Yes    Pain Score  2     Pain Location  Back    Pain Orientation  Right;Lower    Pain Type  Acute pain    Pain Onset  1 to 4 weeks ago                       [facility] Adult PT Treatment/Exercise - 03/03/18 0001      Exercises   Exercises  Knee/Hip      Knee/Hip Exercises: Aerobic   Nustep  level 4 x 15 minutes.      Knee/Hip Exercises: Supine   Other Supine Knee/Hip Exercises  Hip bridges 2 sets of 10 reps.      Knee/Hip Exercises: Prone   Other Prone Exercises  Prone leg lifts 1 set of 10 reps.      Modalities   Modalities  Electrical Stimulation;Moist Heat      Moist Heat Therapy   Number Minutes Moist Heat  20 Minutes    Moist Heat  Location  Lumbar Spine      Electrical Stimulation   Electrical Stimulation Location  Right low back.    Electrical Stimulation Action  Pre-mod.    Electrical Stimulation Parameters  80-150 Hz x 20 minutes.    Electrical Stimulation Goals  Tone;Pain      Ultrasound   Ultrasound Location  In prone:  Right low back.    Ultrasound Parameters  U/S at 1.50 W/CM2 x 5 minutes.                  PT Long Term Goals - 02/04/18 1423      PT LONG TERM GOAL #1   Title  Independent with a HEP.    Time  4    Period  Weeks    Status  On-going      PT LONG TERM GOAL #2   Title  Stand 30 minutes with pain not > 2-3/10.    Time  4    Period  Weeks    Status  On-going      PT LONG TERM GOAL #3   Title  Sit 30 minutes with pain not > 2-3/10.    Time  4    Period  Weeks    Status  On-going      PT LONG TERM GOAL #4   Title  Perform ADL's with pain not > 2-3/10.    Time  4    Period  Weeks    Status  On-going            Plan - 03/03/18 1339    Clinical Impression Statement  Patient is making good progress with a lowered pain-level today.  Able to advance ther ex without complaint today.    Rehab Potential  Excellent    PT  Treatment/Interventions  ADLs/Self Care Home Management;Cryotherapy;Electrical Stimulation;Ultrasound;Moist Heat;Therapeutic activities;Therapeutic exercise;Patient/family education;Manual techniques;Dry needling    PT Next Visit Plan  Progress with core exercises.    Consulted and Agree with Plan of Care  Patient       Patient will benefit from skilled therapeutic intervention in order to improve the following deficits and impairments:  Pain, Decreased activity tolerance, Decreased range of motion, Increased muscle spasms  Visit Diagnosis: Acute right-sided low back pain without sciatica - Plan: PT plan of care cert/re-cert  Localized edema - Plan: PT plan of care cert/re-cert  Other abnormalities of gait and mobility - Plan: PT plan of care cert/re-cert     Problem List Patient Active Problem List   Diagnosis Date Noted  . Episodic memory loss 03/01/2018  . Carotid bruit present 03/01/2018  . IGT (impaired glucose tolerance) 07/21/2017  . Hx of colonic polyps 04/05/2016  . Annual physical exam 11/07/2014  . Hyperlipidemia 08/30/2012  . Metabolic syndrome X 53/61/4431  . OA (osteoarthritis) of knee 01/22/2012  . Allergic rhinitis 07/21/2010  . Cough variant asthma 11/29/2009  . Hypothyroidism 08/23/2008  . Depression with anxiety 08/23/2008  . Essential hypertension 08/23/2008    [place], [place] MPT 03/03/2018, 2:01 PM  [facility] [address] [place], [place], [address] Phone: [phone]   Fax:  [phone]  Name: Rebecca Rodgers MRN: [id number] Date of Birth: Nov 26, 1955

## 2018-03-05 ENCOUNTER — Ambulatory Visit: Payer: Medicare HMO | Admitting: Physical Therapy

## 2018-03-05 ENCOUNTER — Encounter: Payer: Self-pay | Admitting: Physical Therapy

## 2018-03-05 DIAGNOSIS — R2689 Other abnormalities of gait and mobility: Secondary | ICD-10-CM | POA: Diagnosis not present

## 2018-03-05 DIAGNOSIS — M545 Low back pain, unspecified: Secondary | ICD-10-CM

## 2018-03-05 DIAGNOSIS — R600 Localized edema: Secondary | ICD-10-CM | POA: Diagnosis not present

## 2018-03-05 NOTE — Therapy (Signed)
[facility] [place], [place], [address] Phone: [phone]   Fax:  [phone]  Physical Therapy Treatment  Patient Details  Name: Rebecca Rodgers MRN: [id number] Date of Birth: Nov 13, 1955 Referring Provider (PT): Sanjuana Kava MD.   Encounter Date: 03/05/2018  PT End of Session - 03/05/18 1819    Visit Number  9    Number of Visits  16    Date for PT Re-Evaluation  03/30/18    PT Start Time  0311    PT Stop Time  0404    PT Time Calculation (min)  53 min    Activity Tolerance  Patient tolerated treatment well    Behavior During Therapy  [facility] for tasks assessed/performed       Past Medical History:  Diagnosis Date  . Asthma   . Hx of hysterectomy   . Hyperlipidemia   . Hypertension   . Hypothyroidism   . Suicide attempt ([place]) 1997   when a relationship broke up    . Suicide attempt ([place])    3 months ago because of ill health of her parents   . Uterine cancer ([place]) 2005    Past Surgical History:  Procedure Laterality Date  . ABDOMINAL HYSTERECTOMY    . CARPAL TUNNEL RELEASE  1998   bilateral    . COLONOSCOPY  12/20/2010   Procedure: COLONOSCOPY;  Surgeon: Rogene Houston, MD;  [facility];  Service: Endoscopy;  Laterality: N/A;  . COLONOSCOPY N/A 06/27/2016   Procedure: COLONOSCOPY;  Surgeon: Rogene Houston, MD;  [facility];  Service: Endoscopy;  Laterality: N/A;  1030  . DILATION AND CURETTAGE OF UTERUS     at 20   . VESICOVAGINAL FISTULA CLOSURE W/ TAH  2002   dur to uterine cancer     There were no vitals filed for this visit.  Subjective Assessment - 03/05/18 1821    Subjective  I felt some increased pain when I got up today but then it went down again.    Pertinent History  Ankle fracture.    Limitations  Sitting    How long can you sit comfortably?  20 minutes.    How long can you stand comfortably?  10 minutes (ie:  Doing dishes).    Diagnostic tests  X-ray revealed:   Mild degenerative changes of the lumbar spine, no acute findings.    Patient Stated Goals  Get out of pain like before accident.    Currently in Pain?  Yes    Pain Score  2     Pain Orientation  Right;Lower    Pain Descriptors / Indicators  Discomfort    Pain Type  Acute pain    Pain Onset  1 to 4 weeks ago                       [facility] Adult PT Treatment/Exercise - 03/05/18 0001      Modalities   Modalities  Electrical Stimulation;Ultrasound      Moist Heat Therapy   Number Minutes Moist Heat  20 Minutes    Moist Heat Location  Lumbar Spine      Electrical Stimulation   Electrical Stimulation Location  Right low back.    Electrical Stimulation Action  Pre-mod.    Electrical Stimulation Parameters  80-150 Hz x 20 minutes.    Electrical Stimulation Goals  Tone;Pain      Ultrasound   Ultrasound Location  In Prone:  Right low back.    Ultrasound Parameters  U/S at 1.50 W/CM2 x 12 minutes.      Manual Therapy   Manual Therapy  Soft tissue mobilization    Soft tissue mobilization  STW/M x 11 minutes to right low back musculature including QL release technique.                  PT Long Term Goals - 02/04/18 1423      PT LONG TERM GOAL #1   Title  Independent with a HEP.    Time  4    Period  Weeks    Status  On-going      PT LONG TERM GOAL #2   Title  Stand 30 minutes with pain not > 2-3/10.    Time  4    Period  Weeks    Status  On-going      PT LONG TERM GOAL #3   Title  Sit 30 minutes with pain not > 2-3/10.    Time  4    Period  Weeks    Status  On-going      PT LONG TERM GOAL #4   Title  Perform ADL's with pain not > 2-3/10.    Time  4    Period  Weeks    Status  On-going            Plan - 03/05/18 1825    Clinical Impression Statement  Patient is responding very well to treatments.  Her pain has been consistently low recently.    Rehab Potential  Excellent    PT Treatment/Interventions  ADLs/Self Care Home  Management;Cryotherapy;Electrical Stimulation;Ultrasound;Moist Heat;Therapeutic activities;Therapeutic exercise;Patient/family education;Manual techniques;Dry needling    PT Next Visit Plan  Progress with core exercises.    Consulted and Agree with Plan of Care  Patient       Patient will benefit from skilled therapeutic intervention in order to improve the following deficits and impairments:  Pain, Decreased activity tolerance, Decreased range of motion, Increased muscle spasms  Visit Diagnosis: Acute right-sided low back pain without sciatica  Localized edema  Other abnormalities of gait and mobility     Problem List Patient Active Problem List   Diagnosis Date Noted  . Episodic memory loss 03/01/2018  . Carotid bruit present 03/01/2018  . IGT (impaired glucose tolerance) 07/21/2017  . Hx of colonic polyps 04/05/2016  . Annual physical exam 11/07/2014  . Hyperlipidemia 08/30/2012  . Metabolic syndrome X 03/49/1791  . OA (osteoarthritis) of knee 01/22/2012  . Allergic rhinitis 07/21/2010  . Cough variant asthma 11/29/2009  . Hypothyroidism 08/23/2008  . Depression with anxiety 08/23/2008  . Essential hypertension 08/23/2008    Elanore Talcott, [place] MPT 03/05/2018, 6:27 PM  [facility] [address] [place], [place], [address] Phone: [phone]   Fax:  [phone]  Name: LYLEE CORROW MRN: [id number] Date of Birth: November 05, 1955

## 2018-03-10 ENCOUNTER — Encounter: Payer: Self-pay | Admitting: Physical Therapy

## 2018-03-10 ENCOUNTER — Ambulatory Visit: Payer: Medicare HMO | Admitting: Physical Therapy

## 2018-03-10 DIAGNOSIS — M545 Low back pain, unspecified: Secondary | ICD-10-CM

## 2018-03-10 DIAGNOSIS — R600 Localized edema: Secondary | ICD-10-CM | POA: Diagnosis not present

## 2018-03-10 DIAGNOSIS — R2689 Other abnormalities of gait and mobility: Secondary | ICD-10-CM | POA: Diagnosis not present

## 2018-03-10 NOTE — Therapy (Signed)
[facility] [place], [place], [address] Phone: [phone]   Fax:  [phone]  Physical Therapy Treatment  Patient Details  Name: Rebecca Rodgers MRN: [id number] Date of Birth: June 27, 1955 Referring Provider (PT): Sanjuana Kava MD.   Encounter Date: 03/10/2018  PT End of Session - 03/10/18 1442    Visit Number  10    Number of Visits  16    Date for PT Re-Evaluation  03/30/18    PT Start Time  0145    PT Stop Time  0235    PT Time Calculation (min)  50 min    Activity Tolerance  Patient tolerated treatment well    Behavior During Therapy  [facility] for tasks assessed/performed       Past Medical History:  Diagnosis Date  . Asthma   . Hx of hysterectomy   . Hyperlipidemia   . Hypertension   . Hypothyroidism   . Suicide attempt ([place]) 1997   when a relationship broke up    . Suicide attempt ([place])    3 months ago because of ill health of her parents   . Uterine cancer ([place]) 2005    Past Surgical History:  Procedure Laterality Date  . ABDOMINAL HYSTERECTOMY    . CARPAL TUNNEL RELEASE  1998   bilateral    . COLONOSCOPY  12/20/2010   Procedure: COLONOSCOPY;  Surgeon: Rogene Houston, MD;  [facility];  Service: Endoscopy;  Laterality: N/A;  . COLONOSCOPY N/A 06/27/2016   Procedure: COLONOSCOPY;  Surgeon: Rogene Houston, MD;  [facility];  Service: Endoscopy;  Laterality: N/A;  1030  . DILATION AND CURETTAGE OF UTERUS     at 20   . VESICOVAGINAL FISTULA CLOSURE W/ TAH  2002   dur to uterine cancer     There were no vitals filed for this visit.  Subjective Assessment - 03/10/18 1429    Subjective  My pain has been staying low.    Pertinent History  Ankle fracture.    Limitations  Sitting    How long can you sit comfortably?  20 minutes.    How long can you stand comfortably?  10 minutes (ie:  Doing dishes).    Diagnostic tests  X-ray revealed:  Mild degenerative changes of the lumbar spine,  no acute findings.    Patient Stated Goals  Get out of pain like before accident.    Currently in Pain?  Yes    Pain Score  1     Pain Location  Back    Pain Orientation  Right;Lower    Pain Descriptors / Indicators  Discomfort    Pain Onset  1 to 4 weeks ago                       [facility] Adult PT Treatment/Exercise - 03/10/18 0001      Exercises   Exercises  Knee/Hip      Lumbar Exercises: Aerobic   Nustep  Level 4 x 15 minutes.      Lumbar Exercises: Machines for Strengthening   Cybex Lumbar Extension  40# x 4 minutes.    Cybex Knee Extension  40# x 4 minutes.      Modalities   Modalities  Electrical Stimulation;Moist Heat      Moist Heat Therapy   Number Minutes Moist Heat  20 Minutes    Moist Heat Location  Lumbar Spine      Electrical  Stimulation   Electrical Stimulation Location  Right low back.    Electrical Stimulation Action  Pre-mod.    Electrical Stimulation Parameters  80-150 Hz x 20 minutes.    Electrical Stimulation Goals  Tone;Pain                  PT Long Term Goals - 03/10/18 1426      PT LONG TERM GOAL #1   Title  Independent with a HEP.    Time  4    Period  Weeks    Status  Achieved      PT LONG TERM GOAL #2   Title  Stand 30 minutes with pain not > 2-3/10.    Time  4    Period  Weeks    Status  On-going      PT LONG TERM GOAL #3   Title  Sit 30 minutes with pain not > 2-3/10.    Time  4    Period  Weeks    Status  On-going      PT LONG TERM GOAL #4   Title  Perform ADL's with pain not > 2-3/10.    Time  4    Period  Weeks    Status  On-going            Plan - 03/10/18 1438    Clinical Impression Statement  See "Therapy Note" section.    PT Treatment/Interventions  ADLs/Self Care Home Management;Cryotherapy;Electrical Stimulation;Ultrasound;Moist Heat;Therapeutic activities;Therapeutic exercise;Patient/family education;Manual techniques;Dry needling    PT Next Visit Plan  Progress with core  exercises.    Consulted and Agree with Plan of Care  Patient       Patient will benefit from skilled therapeutic intervention in order to improve the following deficits and impairments:  Pain, Decreased activity tolerance, Decreased range of motion, Increased muscle spasms  Visit Diagnosis: Acute right-sided low back pain without sciatica     Problem List Patient Active Problem List   Diagnosis Date Noted  . Episodic memory loss 03/01/2018  . Carotid bruit present 03/01/2018  . IGT (impaired glucose tolerance) 07/21/2017  . Hx of colonic polyps 04/05/2016  . Annual physical exam 11/07/2014  . Hyperlipidemia 08/30/2012  . Metabolic syndrome X 62/94/7654  . OA (osteoarthritis) of knee 01/22/2012  . Allergic rhinitis 07/21/2010  . Cough variant asthma 11/29/2009  . Hypothyroidism 08/23/2008  . Depression with anxiety 08/23/2008  . Essential hypertension 08/23/2008    Progress Note Reporting Period 01/26/18 to 03/10/18  See note below for Objective Data and Assessment of Progress/Goals.  Patient has now experiencing consistently low pain-levels.  She has met LTG #1 and is expected to meet all LTG's.      Rogina Schiano, [place] MPT 03/10/2018, 2:42 PM  [facility] [address] [place], [place], [address] Phone: [phone]   Fax:  [phone]  Name: Rebecca Rodgers MRN: [id number] Date of Birth: Jul 29, 1955

## 2018-03-12 ENCOUNTER — Other Ambulatory Visit: Payer: Self-pay | Admitting: Family Medicine

## 2018-03-16 ENCOUNTER — Encounter: Payer: Self-pay | Admitting: Physical Therapy

## 2018-03-16 ENCOUNTER — Ambulatory Visit: Payer: Medicare HMO | Admitting: Physical Therapy

## 2018-03-16 DIAGNOSIS — R600 Localized edema: Secondary | ICD-10-CM | POA: Diagnosis not present

## 2018-03-16 DIAGNOSIS — M545 Low back pain, unspecified: Secondary | ICD-10-CM

## 2018-03-16 DIAGNOSIS — R2689 Other abnormalities of gait and mobility: Secondary | ICD-10-CM

## 2018-03-16 NOTE — Therapy (Signed)
[facility] [place], [place], [address] Phone: [phone]   Fax:  [phone]  Physical Therapy Treatment  Patient Details  Name: Rebecca Rodgers MRN: [id number] Date of Birth: Jul 22, 1955 Referring Provider (PT): Sanjuana Kava MD.   Encounter Date: 03/16/2018  PT End of Session - 03/16/18 1354    Visit Number  11    Number of Visits  16    Date for PT Re-Evaluation  03/30/18    PT Start Time  1346    PT Stop Time  1431    PT Time Calculation (min)  45 min    Activity Tolerance  Patient tolerated treatment well    Behavior During Therapy  [facility] for tasks assessed/performed       Past Medical History:  Diagnosis Date  . Asthma   . Hx of hysterectomy   . Hyperlipidemia   . Hypertension   . Hypothyroidism   . Suicide attempt ([place]) 1997   when a relationship broke up    . Suicide attempt ([place])    3 months ago because of ill health of her parents   . Uterine cancer ([place]) 2005    Past Surgical History:  Procedure Laterality Date  . ABDOMINAL HYSTERECTOMY    . CARPAL TUNNEL RELEASE  1998   bilateral    . COLONOSCOPY  12/20/2010   Procedure: COLONOSCOPY;  Surgeon: Rogene Houston, MD;  [facility];  Service: Endoscopy;  Laterality: N/A;  . COLONOSCOPY N/A 06/27/2016   Procedure: COLONOSCOPY;  Surgeon: Rogene Houston, MD;  [facility];  Service: Endoscopy;  Laterality: N/A;  1030  . DILATION AND CURETTAGE OF UTERUS     at 20   . VESICOVAGINAL FISTULA CLOSURE W/ TAH  2002   dur to uterine cancer     There were no vitals filed for this visit.  Subjective Assessment - 03/16/18 1353    Subjective  Reports her back is doing better.    Pertinent History  Ankle fracture.    Limitations  Sitting    How long can you sit comfortably?  20 minutes.    How long can you stand comfortably?  10 minutes (ie:  Doing dishes).    Diagnostic tests  X-ray revealed:  Mild degenerative changes of the lumbar  spine, no acute findings.    Patient Stated Goals  Get out of pain like before accident.    Currently in Pain?  Yes    Pain Score  2     Pain Location  Back    Pain Orientation  Right;Lower    Pain Descriptors / Indicators  Discomfort    Pain Type  Acute pain    Pain Onset  1 to 4 weeks ago    Pain Frequency  Constant         [facility] PT Assessment - 03/16/18 0001      Assessment   Medical Diagnosis  Acute midline low back pain without sciatica.    Referring Provider (PT)  Sanjuana Kava MD.    Onset Date/Surgical Date  01/06/18    Next MD Visit  03/31/2018      Precautions   Precautions  None      Restrictions   Weight Bearing Restrictions  No                   [facility] Adult PT Treatment/Exercise - 03/16/18 0001      Lumbar Exercises: Aerobic   Nustep  Level 5 x 15 minutes.      Lumbar Exercises: Machines for Strengthening   Cybex Lumbar Extension  60# 3x10 reps    Cybex Knee Extension  10# 3x10 reps   VCs for core draw in   Cybex Knee Flexion  30# 3x10 reps    VCs for core drawin     Lumbar Exercises: Supine   Bridge  20 reps      Modalities   Modalities  Electrical Stimulation;Moist Heat      Moist Heat Therapy   Number Minutes Moist Heat  15 Minutes    Moist Heat Location  Lumbar Spine      Electrical Stimulation   Electrical Stimulation Location  B low back    Electrical Stimulation Action  Pre-Mod    Electrical Stimulation Parameters  80-150 hz x15 min    Electrical Stimulation Goals  Pain                  PT Long Term Goals - 03/16/18 1402      PT LONG TERM GOAL #1   Title  Independent with a HEP.    Time  4    Period  Weeks    Status  Achieved      PT LONG TERM GOAL #2   Title  Stand 30 minutes with pain not > 2-3/10.    Time  4    Period  Weeks    Status  Achieved      PT LONG TERM GOAL #3   Title  Sit 30 minutes with pain not > 2-3/10.    Time  4    Period  Weeks    Status  On-going      PT LONG TERM GOAL #4    Title  Perform ADL's with pain not > 2-3/10.    Time  4    Period  Weeks    Status  Achieved              Patient will benefit from skilled therapeutic intervention in order to improve the following deficits and impairments:     Visit Diagnosis: Acute right-sided low back pain without sciatica  Localized edema  Other abnormalities of gait and mobility     Problem List Patient Active Problem List   Diagnosis Date Noted  . Episodic memory loss 03/01/2018  . Carotid bruit present 03/01/2018  . IGT (impaired glucose tolerance) 07/21/2017  . Hx of colonic polyps 04/05/2016  . Annual physical exam 11/07/2014  . Hyperlipidemia 08/30/2012  . Metabolic syndrome X 62/83/1517  . OA (osteoarthritis) of knee 01/22/2012  . Allergic rhinitis 07/21/2010  . Cough variant asthma 11/29/2009  . Hypothyroidism 08/23/2008  . Depression with anxiety 08/23/2008  . Essential hypertension 08/23/2008    Standley Brooking, PTA 03/16/2018, 2:39 PM  [facility] [address] [place], [place], [address] Phone: [phone]   Fax:  [phone]  Name: Rebecca Rodgers MRN: [id number] Date of Birth: July 01, 1955

## 2018-03-23 ENCOUNTER — Ambulatory Visit: Payer: Medicare HMO | Admitting: Physical Therapy

## 2018-03-26 ENCOUNTER — Encounter: Payer: Self-pay | Admitting: Physical Therapy

## 2018-03-26 ENCOUNTER — Ambulatory Visit: Payer: Medicare HMO | Admitting: Orthopaedic Surgery

## 2018-03-31 ENCOUNTER — Ambulatory Visit: Payer: Medicare HMO | Admitting: Orthopaedic Surgery

## 2018-03-31 ENCOUNTER — Ambulatory Visit: Payer: Medicare HMO | Attending: Orthopaedic Surgery | Admitting: *Deleted

## 2018-03-31 DIAGNOSIS — R2689 Other abnormalities of gait and mobility: Secondary | ICD-10-CM | POA: Diagnosis not present

## 2018-03-31 DIAGNOSIS — M545 Low back pain, unspecified: Secondary | ICD-10-CM

## 2018-03-31 DIAGNOSIS — R600 Localized edema: Secondary | ICD-10-CM | POA: Insufficient documentation

## 2018-03-31 NOTE — Therapy (Addendum)
[facility] [place], [place], [address] Phone: [phone]   Fax:  [phone]  Physical Therapy Treatment  Patient Details  Name: Rebecca Rodgers MRN: [id number] Date of Birth: [birth year] Referring Provider (PT): Sanjuana Kava MD.   Encounter Date: 03/31/2018  PT End of Session - 03/31/18 1306    Visit Number  12    Number of Visits  16    Date for PT Re-Evaluation  03/30/18    PT Start Time  1300    PT Stop Time  8453    PT Time Calculation (min)  48 min       Past Medical History:  Diagnosis Date  . Asthma   . Hx of hysterectomy   . Hyperlipidemia   . Hypertension   . Hypothyroidism   . Suicide attempt ([place]) 1997   when a relationship broke up    . Suicide attempt ([place])    3 months ago because of ill health of her parents   . Uterine cancer ([place]) 2005    Past Surgical History:  Procedure Laterality Date  . ABDOMINAL HYSTERECTOMY    . CARPAL TUNNEL RELEASE  1998   bilateral    . COLONOSCOPY  12/20/2010   Procedure: COLONOSCOPY;  Surgeon: Rogene Houston, MD;  [facility];  Service: Endoscopy;  Laterality: N/A;  . COLONOSCOPY N/A 06/27/2016   Procedure: COLONOSCOPY;  Surgeon: Rogene Houston, MD;  [facility];  Service: Endoscopy;  Laterality: N/A;  1030  . DILATION AND CURETTAGE OF UTERUS     at 20   . VESICOVAGINAL FISTULA CLOSURE W/ TAH  2002   dur to uterine cancer     There were no vitals filed for this visit.  Subjective Assessment - 03/31/18 1302    Subjective  Reports her back is doing better.    Pertinent History  Ankle fracture.    Limitations  Sitting    How long can you sit comfortably?  20 minutes.    How long can you stand comfortably?  10 minutes (ie:  Doing dishes).    Diagnostic tests  X-ray revealed:  Mild degenerative changes of the lumbar spine, no acute findings.    Patient Stated Goals  Get out of pain like before accident.    Currently in Pain?  Yes     Pain Score  1     Pain Location  Back    Pain Orientation  Right;Lower    Pain Descriptors / Indicators  Discomfort    Pain Onset  1 to 4 weeks ago    Pain Frequency  Constant                       [facility] Adult PT Treatment/Exercise - 03/31/18 0001      Lumbar Exercises: Aerobic   Nustep  Level 5 x 15 minutes.      Lumbar Exercises: Machines for Strengthening   Cybex Lumbar Extension  60# 2x10 reps   60#s flexion 2x10   Cybex Knee Extension  --    Cybex Knee Flexion  --      Lumbar Exercises: Supine   Bridge  20 reps      Modalities   Modalities  Electrical Stimulation;Moist Heat      Moist Heat Therapy   Number Minutes Moist Heat  20 Minutes    Moist Heat Location  Lumbar Spine      Electrical Stimulation  Electrical Stimulation Location  B low back    Electrical Stimulation Action  Premod    Electrical Stimulation Parameters  80-_0  x  20 mins    Electrical Stimulation Goals  Pain                  PT Long Term Goals - 03/31/18 1305      PT LONG TERM GOAL #1   Title  Independent with a HEP.    Time  4    Period  Weeks    Status  Achieved      PT LONG TERM GOAL #2   Title  Stand 30 minutes with pain not > 2-3/10.    Time  4    Period  Weeks    Status  Achieved      PT LONG TERM GOAL #3   Title  Sit 30 minutes with pain not > 2-3/10.    Time  4    Period  Weeks    Status  On-going      PT LONG TERM GOAL #4   Title  Perform ADL's with pain not > 2-3/10.    Time  4    Period  Weeks    Status  Achieved            Plan - 03/31/18 1316    Clinical Impression Statement  Pt arrived today doing very well with minimal pain now 1-2/10. She has met all LTGs and will F/U with MD with possible DC after today.     Clinical Presentation  Stable    Clinical Decision Making  Low    Rehab Potential  Excellent    PT Treatment/Interventions  ADLs/Self Care Home Management;Cryotherapy;Electrical Stimulation;Ultrasound;Moist  Heat;Therapeutic activities;Therapeutic exercise;Patient/family education;Manual techniques;Dry needling    PT Next Visit Plan  MD note today    Consulted and Agree with Plan of Care  Patient       Patient will benefit from skilled therapeutic intervention in order to improve the following deficits and impairments:  Pain, Decreased activity tolerance, Decreased range of motion, Increased muscle spasms  Visit Diagnosis: Acute right-sided low back pain without sciatica  Localized edema  Other abnormalities of gait and mobility     Problem List Patient Active Problem List   Diagnosis Date Noted  . Episodic memory loss 03/01/2018  . Carotid bruit present 03/01/2018  . IGT (impaired glucose tolerance) 07/21/2017  . Hx of colonic polyps 04/05/2016  . Annual physical exam 11/07/2014  . Hyperlipidemia 08/30/2012  . Metabolic syndrome X 28/78/6767  . OA (osteoarthritis) of knee 01/22/2012  . Allergic rhinitis 07/21/2010  . Cough variant asthma 11/29/2009  . Hypothyroidism 08/23/2008  . Depression with anxiety 08/23/2008  . Essential hypertension 08/23/2008    Chelle Cayton,CHRIS, PTA 03/31/2018, 1:49 PM  [facility] [address] [place], [place], [address] Phone: [phone]   Fax:  [phone]  Name: Rebecca Rodgers MRN: [id number] Date of Birth: 04-23-1955  PHYSICAL THERAPY DISCHARGE SUMMARY  Visits from Start of Care: 12.  Current functional level related to goals / functional outcomes: See above.   Remaining deficits: See goal section.   Education / Equipment: HEP.  Plan: Patient agrees to discharge.  Patient goals were partially met. Patient is being discharged due to being pleased with the current functional level.  ?????           Mali Applegate MPT

## 2018-04-01 ENCOUNTER — Ambulatory Visit: Payer: Medicare HMO | Admitting: Orthopaedic Surgery

## 2018-04-01 ENCOUNTER — Encounter: Payer: Self-pay | Admitting: Orthopaedic Surgery

## 2018-04-01 VITALS — BP 124/82 | HR 96 | Ht 63.0 in | Wt 162.0 lb

## 2018-04-01 DIAGNOSIS — M545 Low back pain, unspecified: Secondary | ICD-10-CM

## 2018-04-01 DIAGNOSIS — G8929 Other chronic pain: Secondary | ICD-10-CM

## 2018-04-01 NOTE — Progress Notes (Signed)
Patient Rebecca Rodgers, female DOB:1956/02/18, 64 y.o. IRS:[id number]  Chief Complaint  Patient presents with  . Back Pain    LBP    HPI  Rebecca Rodgers is a 63 y.o. female who has lower back pain midline with no paresthesias.  She has been to PT.  She is better.  She still has some pain but much less.  She is doing her exercises at home.  She has stopped PT.  She is taking her medicine which helps.   Body mass index is 28.7 kg/m.  ROS  Review of Systems  Constitutional: Positive for activity change.  Respiratory: Positive for shortness of breath. Negative for cough.   Musculoskeletal: Positive for arthralgias and back pain.  All other systems reviewed and are negative.   All other systems reviewed and are negative.  The following is a summary of the past history medically, past history surgically, known current medicines, social history and family history.  This information is gathered electronically by the computer from prior information and documentation.  I review this each visit and have found including this information at this point in the chart is beneficial and informative.    Past Medical History:  Diagnosis Date  . Asthma   . Hx of hysterectomy   . Hyperlipidemia   . Hypertension   . Hypothyroidism   . Suicide attempt ([place]) 1997   when a relationship broke up    . Suicide attempt ([place])    3 months ago because of ill health of her parents   . Uterine cancer ([place]) 2005    Past Surgical History:  Procedure Laterality Date  . ABDOMINAL HYSTERECTOMY    . CARPAL TUNNEL RELEASE  1998   bilateral    . COLONOSCOPY  12/20/2010   Procedure: COLONOSCOPY;  Surgeon: Rogene Houston, MD;  [facility];  Service: Endoscopy;  Laterality: N/A;  . COLONOSCOPY N/A 06/27/2016   Procedure: COLONOSCOPY;  Surgeon: Rogene Houston, MD;  [facility]: [facility];  Service: Endoscopy;  Laterality: N/A;  1030  . DILATION AND CURETTAGE OF UTERUS     at 20   .  VESICOVAGINAL FISTULA CLOSURE W/ TAH  2002   dur to uterine cancer     Family History  Problem Relation Age of Onset  . Cataracts Mother   . Hypertension Mother   . Hyperlipidemia Mother   . Dementia Mother   . Depression Mother   . Coronary artery disease Father   . Hyperlipidemia Father   . Stroke Father   . Cancer Sister 35       breast   . Cancer Brother 4       prostate    Social History Social History   Tobacco Use  . Smoking status: Former Smoker    Packs/day: 0.25    Years: 15.00    Pack years: 3.75    Types: Cigarettes    Last attempt to quit: 02/08/2015    Years since quitting: 3.1  . Smokeless tobacco: Former Systems developer    Types: Chew    Quit date: 02/18/2016  Substance Use Topics  . Alcohol use: No    Alcohol/week: 0.0 standard drinks  . Drug use: No    Allergies  Allergen Reactions  . Codeine     Lip swelling   . Penicillins     Lip swelling Has patient had a PCN reaction causing immediate rash, facial/tongue/throat swelling, SOB or lightheadedness with hypotension: Yes Has patient had a  PCN reaction causing severe rash involving mucus membranes or skin necrosis: No Has patient had a PCN reaction that required hospitalization No Has patient had a PCN reaction occurring within the last 10 years: No If all of the above answers are "NO", then may proceed with Cephalosporin use.     Current Outpatient Medications  Medication Sig Dispense Refill  . albuterol (PROVENTIL HFA;VENTOLIN HFA) 108 (90 Base) MCG/ACT inhaler Inhale 2 puffs into the lungs every 6 (six) hours as needed for wheezing or shortness of breath. (Patient not taking: Reported on 02/26/2018) 1 Inhaler 0  . amLODipine (NORVASC) 10 MG tablet TAKE 1 TABLET DAILY 90 tablet 2  . buPROPion (WELLBUTRIN XL) 150 MG 24 hr tablet Take 150 mg by mouth daily.    . cholecalciferol (VITAMIN D) 1000 units tablet Take 2,000 Units by mouth every Tuesday.     . clotrimazole-betamethasone (LOTRISONE) cream  APPLY TO AFFECTED AREAS TWICE A DAY 45 g 0  . cyclobenzaprine (FLEXERIL) 10 MG tablet TAKE 1 TABLET AT BEDTIME AS NEEDED FOR SPASM 30 tablet 0  . ezetimibe (ZETIA) 10 MG tablet Take 1 tablet (10 mg total) by mouth daily. 90 tablet 3  . ibuprofen (ADVIL,MOTRIN) 800 MG tablet Take one po tid x 10 days    . lamoTRIgine (LAMICTAL) 200 MG tablet Take 1 tablet (200 mg total) by mouth daily. 30 tablet 2  . levothyroxine (SYNTHROID, LEVOTHROID) 50 MCG tablet TAKE 1 TABLET DAILY MON-SAT AND 1&1/2 TABLETS ON SUN 100 tablet 2  . montelukast (SINGULAIR) 10 MG tablet Take 1 tablet (10 mg total) by mouth at bedtime. 90 tablet 3  . OLANZapine (ZYPREXA) 5 MG tablet Take 1 tablet (5 mg total) by mouth at bedtime. 30 tablet 2  . omeprazole (PRILOSEC) 20 MG capsule Take 20 mg by mouth daily.     . rosuvastatin (CRESTOR) 40 MG tablet TAKE 1 TABLET DAILY AT BEDTIME FOR CHOLESTEROL 90 tablet 2  . SYMBICORT 160-4.5 MCG/ACT inhaler 2 PUFFS 2 TIMES A DAY 30.6 g 1  . traZODone (DESYREL) 100 MG tablet Take 1 tablet (100 mg total) by mouth at bedtime. 30 tablet 2  . triamterene-hydrochlorothiazide (MAXZIDE-25) 37.5-25 MG tablet Take 1 tablet by mouth daily. 90 tablet 0  . VOLTAREN 1 % GEL APPLY TO THE AFFECTED AREA(S) TWICE A DAY AS NEEDED FOR PAIN 100 g 0   No current facility-administered medications for this visit.      Physical Exam  Blood pressure 124/82, pulse 96, height 5\' 3"  (1.6 m), weight 162 lb (73.5 kg).  Constitutional: overall normal hygiene, normal nutrition, well developed, normal grooming, normal body habitus. Assistive device:none  Musculoskeletal: gait and station Limp none, muscle tone and strength are normal, no tremors or atrophy is present.  .  Neurological: coordination overall normal.  Deep tendon reflex/nerve stretch intact.  Sensation normal.  Cranial nerves II-XII intact.   Skin:   Normal overall no scars, lesions, ulcers or rashes. No psoriasis.  Psychiatric: Alert and oriented x 3.   Recent memory intact, remote memory unclear.  Normal mood and affect. Well groomed.  Good eye contact.  Cardiovascular: overall no swelling, no varicosities, no edema bilaterally, normal temperatures of the legs and arms, no clubbing, cyanosis and good capillary refill.  Lymphatic: palpation is normal.  Spine/Pelvis examination:  Inspection:  Overall, sacoiliac joint benign and hips nontender; without crepitus or defects.   Thoracic spine inspection: Alignment normal without kyphosis present   Lumbar spine inspection:  Alignment  with normal  lumbar lordosis, without scoliosis apparent.   Thoracic spine palpation:  without tenderness of spinal processes   Lumbar spine palpation: without tenderness of lumbar area; without tightness of lumbar muscles    Range of Motion:   Lumbar flexion, forward flexion is normal without pain or tenderness    Lumbar extension is full without pain or tenderness   Left lateral bend is normal without pain or tenderness   Right lateral bend is normal without pain or tenderness   Straight leg raising is normal  Strength & tone: normal   Stability overall normal stability  All other systems reviewed and are negative   The patient has been educated about the nature of the problem(s) and counseled on treatment options.  The patient appeared to understand what I have discussed and is in agreement with it.  Encounter Diagnosis  Name Primary?  . Chronic midline low back pain without sciatica Yes    PLAN Call if any problems.  Precautions discussed.  Continue current medications.   Return to clinic 1 month   Electronically Signed Sanjuana Kava, MD 1/8/20202:51 PM

## 2018-04-15 ENCOUNTER — Other Ambulatory Visit: Payer: Self-pay | Admitting: Family Medicine

## 2018-04-15 ENCOUNTER — Encounter: Payer: Self-pay | Admitting: Emergency Medicine

## 2018-04-15 DIAGNOSIS — F319 Bipolar disorder, unspecified: Secondary | ICD-10-CM | POA: Insufficient documentation

## 2018-04-29 ENCOUNTER — Ambulatory Visit: Payer: Self-pay | Admitting: Orthopaedic Surgery

## 2018-05-06 ENCOUNTER — Ambulatory Visit: Payer: Self-pay | Admitting: Orthopaedic Surgery

## 2018-05-07 ENCOUNTER — Encounter: Payer: Self-pay | Admitting: Orthopaedic Surgery

## 2018-05-07 ENCOUNTER — Ambulatory Visit: Payer: Medicare HMO | Admitting: Orthopaedic Surgery

## 2018-05-07 VITALS — BP 112/74 | HR 90 | Ht 63.0 in | Wt 162.0 lb

## 2018-05-07 DIAGNOSIS — M545 Low back pain: Secondary | ICD-10-CM | POA: Diagnosis not present

## 2018-05-07 DIAGNOSIS — G8929 Other chronic pain: Secondary | ICD-10-CM | POA: Diagnosis not present

## 2018-05-07 NOTE — Progress Notes (Signed)
Patient IP:Rebecca Rodgers, female DOB:July 23, 1955, 63 y.o. [id number]  Chief Complaint  Patient presents with  . Back Pain    HPI  Rebecca Rodgers is a 63 y.o. female who has lower back pain that is much better.  She still has some pain at times with the cold rain we have had but it is much less. She is doing the exercises she learned in PT.  She is walking well and has no numbness.   Body mass index is 28.7 kg/m.  ROS  Review of Systems  Constitutional: Positive for activity change.  Respiratory: Positive for shortness of breath. Negative for cough.   Musculoskeletal: Positive for arthralgias and back pain.  All other systems reviewed and are negative.   All other systems reviewed and are negative.  The following is a summary of the past history medically, past history surgically, known current medicines, social history and family history.  This information is gathered electronically by the computer from prior information and documentation.  I review this each visit and have found including this information at this point in the chart is beneficial and informative.    Past Medical History:  Diagnosis Date  . Asthma   . Hx of hysterectomy   . Hyperlipidemia   . Hypertension   . Hypothyroidism   . Suicide attempt ([place]) 1997   when a relationship broke up    . Suicide attempt ([place])    3 months ago because of ill health of her parents   . Uterine cancer ([place]) 2005    Past Surgical History:  Procedure Laterality Date  . ABDOMINAL HYSTERECTOMY    . CARPAL TUNNEL RELEASE  1998   bilateral    . COLONOSCOPY  12/20/2010   Procedure: COLONOSCOPY;  Surgeon: Rogene Houston, MD;  [facility];  Service: Endoscopy;  Laterality: N/A;  . COLONOSCOPY N/A 06/27/2016   Procedure: COLONOSCOPY;  Surgeon: Rogene Houston, MD;  [facility];  Service: Endoscopy;  Laterality: N/A;  1030  . DILATION AND CURETTAGE OF UTERUS     at 20   . VESICOVAGINAL FISTULA CLOSURE  W/ TAH  2002   dur to uterine cancer     Family History  Problem Relation Age of Onset  . Cataracts Mother   . Hypertension Mother   . Hyperlipidemia Mother   . Dementia Mother   . Depression Mother   . Coronary artery disease Father   . Hyperlipidemia Father   . Stroke Father   . Cancer Sister 59       breast   . Cancer Brother 89       prostate    Social History Social History   Tobacco Use  . Smoking status: Former Smoker    Packs/day: 0.25    Years: 15.00    Pack years: 3.75    Types: Cigarettes    Last attempt to quit: 02/08/2015    Years since quitting: 3.2  . Smokeless tobacco: Former Systems developer    Types: Chew    Quit date: 02/18/2016  Substance Use Topics  . Alcohol use: No    Alcohol/week: 0.0 standard drinks  . Drug use: No    Allergies  Allergen Reactions  . Codeine     Lip swelling   . Penicillins     Lip swelling Has patient had a PCN reaction causing immediate rash, facial/tongue/throat swelling, SOB or lightheadedness with hypotension: Yes Has patient had a PCN reaction causing severe rash involving  mucus membranes or skin necrosis: No Has patient had a PCN reaction that required hospitalization No Has patient had a PCN reaction occurring within the last 10 years: No If all of the above answers are "NO", then may proceed with Cephalosporin use.     Current Outpatient Medications  Medication Sig Dispense Refill  . amLODipine (NORVASC) 10 MG tablet TAKE 1 TABLET DAILY 90 tablet 2  . buPROPion (WELLBUTRIN XL) 150 MG 24 hr tablet Take 150 mg by mouth daily.    . cholecalciferol (VITAMIN D) 1000 units tablet Take 2,000 Units by mouth every Tuesday.     . clotrimazole-betamethasone (LOTRISONE) cream APPLY TO AFFECTED AREAS TWICE A DAY 45 g 0  . ezetimibe (ZETIA) 10 MG tablet Take 1 tablet (10 mg total) by mouth daily. 90 tablet 3  . lamoTRIgine (LAMICTAL) 200 MG tablet Take 1 tablet (200 mg total) by mouth daily. 30 tablet 2  . levothyroxine  (SYNTHROID, LEVOTHROID) 50 MCG tablet TAKE 1 TABLET DAILY MON-SAT AND 1&1/2 TABLETS ON SUN 100 tablet 2  . montelukast (SINGULAIR) 10 MG tablet Take 1 tablet (10 mg total) by mouth at bedtime. 90 tablet 3  . OLANZapine (ZYPREXA) 5 MG tablet Take 1 tablet (5 mg total) by mouth at bedtime. 30 tablet 2  . omeprazole (PRILOSEC) 20 MG capsule Take 20 mg by mouth daily.     . rosuvastatin (CRESTOR) 40 MG tablet TAKE 1 TABLET DAILY AT BEDTIME FOR CHOLESTEROL 90 tablet 2  . SYMBICORT 160-4.5 MCG/ACT inhaler 2 PUFFS 2 TIMES A DAY 30.6 g 0  . traZODone (DESYREL) 100 MG tablet Take 1 tablet (100 mg total) by mouth at bedtime. 30 tablet 2  . triamterene-hydrochlorothiazide (MAXZIDE-25) 37.5-25 MG tablet Take 1 tablet by mouth daily. 90 tablet 0  . VOLTAREN 1 % GEL APPLY TO THE AFFECTED AREA(S) TWICE A DAY AS NEEDED FOR PAIN 100 g 0  . albuterol (PROVENTIL HFA;VENTOLIN HFA) 108 (90 Base) MCG/ACT inhaler Inhale 2 puffs into the lungs every 6 (six) hours as needed for wheezing or shortness of breath. (Patient not taking: Reported on 02/26/2018) 1 Inhaler 0  . ibuprofen (ADVIL,MOTRIN) 800 MG tablet Take one po tid x 10 days     No current facility-administered medications for this visit.      Physical Exam  Blood pressure 112/74, pulse 90, height 5\' 3"  (1.6 m), weight 162 lb (73.5 kg).  Constitutional: overall normal hygiene, normal nutrition, well developed, normal grooming, normal body habitus. Assistive device:none  Musculoskeletal: gait and station Limp none, muscle tone and strength are normal, no tremors or atrophy is present.  .  Neurological: coordination overall normal.  Deep tendon reflex/nerve stretch intact.  Sensation normal.  Cranial nerves II-XII intact.   Skin:   Normal overall no scars, lesions, ulcers or rashes. No psoriasis.  Psychiatric: Alert and oriented x 3.  Recent memory intact, remote memory unclear.  Normal mood and affect. Well groomed.  Good eye contact.  Cardiovascular:  overall no swelling, no varicosities, no edema bilaterally, normal temperatures of the legs and arms, no clubbing, cyanosis and good capillary refill.  Lymphatic: palpation is normal.  Spine/Pelvis examination:  Inspection:  Overall, sacoiliac joint benign and hips nontender; without crepitus or defects.   Thoracic spine inspection: Alignment normal without kyphosis present   Lumbar spine inspection:  Alignment  with normal lumbar lordosis, without scoliosis apparent.   Thoracic spine palpation:  without tenderness of spinal processes   Lumbar spine palpation: without tenderness of lumbar  area; without tightness of lumbar muscles    Range of Motion:   Lumbar flexion, forward flexion is normal without pain or tenderness    Lumbar extension is full without pain or tenderness   Left lateral bend is normal without pain or tenderness   Right lateral bend is normal without pain or tenderness   Straight leg raising is normal  Strength & tone: normal   Stability overall normal stability  All other systems reviewed and are negative   The patient has been educated about the nature of the problem(s) and counseled on treatment options.  The patient appeared to understand what I have discussed and is in agreement with it.  Encounter Diagnosis  Name Primary?  . Chronic midline low back pain without sciatica Yes    PLAN Call if any problems.  Precautions discussed.    Return to clinic prn   Electronically Signed Sanjuana Kava, MD 2/13/20209:26 AM

## 2018-05-20 ENCOUNTER — Other Ambulatory Visit: Payer: Self-pay | Admitting: Family Medicine

## 2018-05-21 ENCOUNTER — Telehealth: Payer: Self-pay | Admitting: Family Medicine

## 2018-05-21 NOTE — Telephone Encounter (Signed)
Please call and advise pt that her recent potassium was low normal. I recommend eating potassium rich foods, I do not recommed a potassium tablet, please provide her with a list of foods when callling. See accompanying pharmacy message

## 2018-05-21 NOTE — Telephone Encounter (Signed)
Patient aware and potassium foods reviewed and mailed

## 2018-06-02 ENCOUNTER — Ambulatory Visit: Payer: Medicare HMO | Admitting: Physician Assistant

## 2018-06-02 ENCOUNTER — Encounter: Payer: Self-pay | Admitting: Physician Assistant

## 2018-06-02 ENCOUNTER — Ambulatory Visit: Payer: Self-pay | Admitting: Physician Assistant

## 2018-06-02 DIAGNOSIS — F319 Bipolar disorder, unspecified: Secondary | ICD-10-CM

## 2018-06-02 DIAGNOSIS — F3162 Bipolar disorder, current episode mixed, moderate: Secondary | ICD-10-CM | POA: Diagnosis not present

## 2018-06-02 MED ORDER — LAMOTRIGINE 200 MG PO TABS: 200.0000 mg | ORAL_TABLET | Freq: Every day | ORAL | 1 refills | Status: DC

## 2018-06-02 MED ORDER — OLANZAPINE 5 MG PO TABS: 5.0000 mg | ORAL_TABLET | Freq: Every day | ORAL | 1 refills | Status: DC

## 2018-06-02 MED ORDER — TRAZODONE HCL 100 MG PO TABS: 100.0000 mg | ORAL_TABLET | Freq: Every evening | ORAL | 1 refills | Status: DC | PRN

## 2018-06-02 MED ORDER — BUPROPION HCL ER (XL) 150 MG PO TB24: 150.0000 mg | ORAL_TABLET | Freq: Every day | ORAL | 1 refills | Status: DC

## 2018-06-02 NOTE — Progress Notes (Signed)
[facility]  Patient ID: Rebecca Rodgers,  MRN: [id number]  PCP: Fayrene Helper, MD  Date of Evaluation: 06/02/2018 Time spent:15 minutes  Chief Complaint:  Chief Complaint    Follow-up      HISTORY/CURRENT STATUS: HPI Here for routine 6 month med check.    Doing well.  Had been having trouble sleeping but for the past couple of months she's been doing much better. Unsure why.  Patient denies loss of interest in usual activities and is able to enjoy things.  Denies decreased energy or motivation.  Appetite has not changed.  No extreme sadness, tearfulness, or feelings of hopelessness.  Denies any changes in concentration, making decisions or remembering things.  Denies suicidal or homicidal thoughts.  Patient denies increased energy with decreased need for sleep, no increased talkativeness, no racing thoughts, no impulsivity or risky behaviors, no increased spending, no increased libido, no grandiosity.  Denies muscle or joint pain, stiffness, or dystonia.  Denies dizziness, syncope, seizures, numbness, tingling, tremor, tics, unsteady gait, slurred speech, confusion.   Individual Medical History/ Review of Systems: Changes? :No    Past medications for mental health diagnoses include: Lamictal, Trazodone, Zyprexa, Wellbutrin, others she can't remember.   Allergies: Codeine and Penicillins  Current Medications:  Current Outpatient Medications:  .  albuterol (PROVENTIL HFA;VENTOLIN HFA) 108 (90 Base) MCG/ACT inhaler, Inhale 2 puffs into the lungs every 6 (six) hours as needed for wheezing or shortness of breath., Disp: 1 Inhaler, Rfl: 0 .  amLODipine (NORVASC) 10 MG tablet, TAKE 1 TABLET DAILY, Disp: 90 tablet, Rfl: 2 .  buPROPion (WELLBUTRIN XL) 150 MG 24 hr tablet, Take 1 tablet (150 mg total) by mouth daily., Disp: 90 tablet, Rfl: 1 .  clotrimazole-betamethasone (LOTRISONE) cream, APPLY TO AFFECTED AREAS TWICE A DAY, Disp: 45 g, Rfl: 0 .  ibuprofen  (ADVIL,MOTRIN) 800 MG tablet, Take one po tid x 10 days, Disp: , Rfl:  .  lamoTRIgine (LAMICTAL) 200 MG tablet, Take 1 tablet (200 mg total) by mouth daily., Disp: 90 tablet, Rfl: 1 .  levothyroxine (SYNTHROID, LEVOTHROID) 50 MCG tablet, TAKE 1 TABLET DAILY MON-SAT AND 1&1/2 TABLETS ON SUN, Disp: 100 tablet, Rfl: 2 .  montelukast (SINGULAIR) 10 MG tablet, Take 1 tablet (10 mg total) by mouth at bedtime., Disp: 90 tablet, Rfl: 3 .  OLANZapine (ZYPREXA) 5 MG tablet, Take 1 tablet (5 mg total) by mouth at bedtime., Disp: 90 tablet, Rfl: 1 .  omeprazole (PRILOSEC) 20 MG capsule, Take 20 mg by mouth daily. , Disp: , Rfl:  .  rosuvastatin (CRESTOR) 40 MG tablet, TAKE 1 TABLET DAILY AT BEDTIME FOR CHOLESTEROL, Disp: 90 tablet, Rfl: 2 .  SYMBICORT 160-4.5 MCG/ACT inhaler, 2 PUFFS 2 TIMES A DAY, Disp: 30.6 g, Rfl: 0 .  traZODone (DESYREL) 100 MG tablet, Take 1 tablet (100 mg total) by mouth at bedtime as needed for sleep., Disp: 90 tablet, Rfl: 1 .  triamterene-hydrochlorothiazide (MAXZIDE-25) 37.5-25 MG tablet, Take 1 tablet by mouth daily., Disp: 90 tablet, Rfl: 0 .  VOLTAREN 1 % GEL, APPLY TO THE AFFECTED AREA(S) TWICE A DAY AS NEEDED FOR PAIN, Disp: 100 g, Rfl: 0 .  cholecalciferol (VITAMIN D) 1000 units tablet, Take 2,000 Units by mouth every Tuesday. , Disp: , Rfl:  .  ezetimibe (ZETIA) 10 MG tablet, Take 1 tablet (10 mg total) by mouth daily., Disp: 90 tablet, Rfl: 3 Medication Side Effects: none  Family Medical/ Social History: Changes? No  MENTAL HEALTH EXAM:  There  were no vitals taken for this visit.There is no height or weight on file to calculate BMI.  General Appearance: Casual and Well Groomed  Eye Contact:  Good  Speech:  Clear and Coherent  Volume:  Normal  Mood:  Euthymic  Affect:  Appropriate  Thought Process:  Goal Directed  Orientation:  Full (Time, Place, and Person)  Thought Content: Logical   Suicidal Thoughts:  No  Homicidal Thoughts:  No  Memory:  WNL  Judgement:   Good  Insight:  Good  Psychomotor Activity:  Normal  Concentration:  Concentration: Good  Recall:  Good  Fund of Knowledge: Good  Language: Good  Assets:  Desire for Improvement  ADL's:  Intact  Cognition: WNL  Prognosis:  Good  See labs on chart from December 2019.  Glucose and cholesterol panels were good.  DIAGNOSES:    ICD-10-CM   1. Bipolar 1 disorder, mixed, moderate (HCC) F31.62 lamoTRIgine (LAMICTAL) 200 MG tablet  2. Bipolar 1 disorder (HCC) F31.9 OLANZapine (ZYPREXA) 5 MG tablet    traZODone (DESYREL) 100 MG tablet    Receiving Psychotherapy: No    RECOMMENDATIONS: Continue Lamictal 200 mg daily. Continue trazodone 100 mg nightly as needed.  If needed, she can increase to 1-1/2 pills.  Let me know if she has to do that however so that I can increase the quantity of her medication.  We also discussed sleep hygiene. Continue Wellbutrin XL 150 mg every morning. Continue Zyprexa 5 mg 1 nightly. Return in 6 months or sooner as needed.  Donnal Moat, PA-C   This record has been created using [employer].  Chart creation errors have been sought, but may not always have been located and corrected. Such creation errors do not reflect on the standard of medical care.

## 2018-06-16 ENCOUNTER — Ambulatory Visit: Payer: Self-pay | Admitting: Physician Assistant

## 2018-06-18 ENCOUNTER — Telehealth: Payer: Self-pay | Admitting: Physician Assistant

## 2018-06-18 NOTE — Telephone Encounter (Signed)
Patient called and said that the 150 mg of trazadone is working. Just wanted you to know

## 2018-06-29 ENCOUNTER — Other Ambulatory Visit: Payer: Self-pay | Admitting: Family Medicine

## 2018-07-03 ENCOUNTER — Telehealth: Payer: Self-pay | Admitting: Family Medicine

## 2018-07-03 NOTE — Telephone Encounter (Signed)
Returned pt call and told her we are closed till tue am.  If Emergency go to urgent care if not call back and leave a message.  We will be happy to call you back on Tuesday.

## 2018-07-07 ENCOUNTER — Other Ambulatory Visit: Payer: Self-pay | Admitting: Family Medicine

## 2018-07-21 ENCOUNTER — Telehealth: Payer: Self-pay

## 2018-07-21 DIAGNOSIS — E038 Other specified hypothyroidism: Secondary | ICD-10-CM

## 2018-07-21 DIAGNOSIS — R7301 Impaired fasting glucose: Secondary | ICD-10-CM | POA: Diagnosis not present

## 2018-07-21 DIAGNOSIS — E8881 Metabolic syndrome: Secondary | ICD-10-CM | POA: Diagnosis not present

## 2018-07-21 DIAGNOSIS — I10 Essential (primary) hypertension: Secondary | ICD-10-CM | POA: Diagnosis not present

## 2018-07-21 DIAGNOSIS — E785 Hyperlipidemia, unspecified: Secondary | ICD-10-CM

## 2018-07-21 NOTE — Telephone Encounter (Signed)
Labs ordered- pt called at [facility] without order

## 2018-07-24 LAB — COMPLETE METABOLIC PANEL WITH GFR
AG Ratio: 1.4 (calc) (ref 1.0–2.5)
ALT: 20 U/L (ref 6–29)
AST: 22 U/L (ref 10–35)
Albumin: 4.3 g/dL (ref 3.6–5.1)
Alkaline phosphatase (APISO): 59 U/L (ref 37–153)
BUN: 21 mg/dL (ref 7–25)
CO2: 26 mmol/L (ref 20–32)
Calcium: 9.7 mg/dL (ref 8.6–10.4)
Chloride: 103 mmol/L (ref 98–110)
Creat: 0.99 mg/dL (ref 0.50–0.99)
GFR, Est African American: 71 mL/min/{1.73_m2} (ref >=60)
GFR, Est Non African American: 61 mL/min/{1.73_m2} (ref >=60)
Globulin: 3.1 g/dL (calc) (ref 1.9–3.7)
Glucose, Bld: 91 mg/dL (ref 65–99)
Potassium: 3.8 mmol/L (ref 3.5–5.3)
Sodium: 139 mmol/L (ref 135–146)
Total Bilirubin: 0.3 mg/dL (ref 0.2–1.2)
Total Protein: 7.4 g/dL (ref 6.1–8.1)

## 2018-07-24 LAB — HEMOGLOBIN A1C
Hgb A1c MFr Bld: 5.7 % of total Hgb — ABNORMAL HIGH (ref <5.7)
Mean Plasma Glucose: 117 (calc)
eAG (mmol/L): 6.5 (calc)

## 2018-07-24 LAB — LIPID PANEL
Cholesterol: 185 mg/dL (ref <200)
HDL: 70 mg/dL (ref >=50)
LDL Cholesterol (Calc): 99 mg/dL (calc)
Non-HDL Cholesterol (Calc): 115 mg/dL (calc) (ref <130)
Total CHOL/HDL Ratio: 2.6 (calc) (ref <5.0)
Triglycerides: 74 mg/dL (ref <150)

## 2018-07-24 LAB — TSH: TSH: 2.39 mIU/L (ref 0.40–4.50)

## 2018-07-27 ENCOUNTER — Other Ambulatory Visit: Payer: Self-pay

## 2018-07-27 ENCOUNTER — Ambulatory Visit: Payer: Self-pay | Admitting: Family Medicine

## 2018-07-27 ENCOUNTER — Encounter: Payer: Self-pay | Admitting: Family Medicine

## 2018-07-27 ENCOUNTER — Ambulatory Visit (INDEPENDENT_AMBULATORY_CARE_PROVIDER_SITE_OTHER): Payer: Medicare HMO | Admitting: Family Medicine

## 2018-07-27 VITALS — BP 120/70 | Ht 63.0 in | Wt 167.0 lb

## 2018-07-27 DIAGNOSIS — E038 Other specified hypothyroidism: Secondary | ICD-10-CM | POA: Diagnosis not present

## 2018-07-27 DIAGNOSIS — E559 Vitamin D deficiency, unspecified: Secondary | ICD-10-CM

## 2018-07-27 DIAGNOSIS — I10 Essential (primary) hypertension: Secondary | ICD-10-CM | POA: Diagnosis not present

## 2018-07-27 DIAGNOSIS — R7302 Impaired glucose tolerance (oral): Secondary | ICD-10-CM | POA: Diagnosis not present

## 2018-07-27 DIAGNOSIS — J309 Allergic rhinitis, unspecified: Secondary | ICD-10-CM | POA: Diagnosis not present

## 2018-07-27 DIAGNOSIS — E782 Mixed hyperlipidemia: Secondary | ICD-10-CM | POA: Diagnosis not present

## 2018-07-27 DIAGNOSIS — Z1231 Encounter for screening mammogram for malignant neoplasm of breast: Secondary | ICD-10-CM

## 2018-07-27 NOTE — Patient Instructions (Addendum)
Wellness past due , please schedule   Annual physical exam with MD 2nd week  Mammogram is scheduled and the appoint,ment info is sent  Please get CBC, fasting lipid, cmp and eGFR, HBA1c, TSH and vit D 3rd week in November  It is important that you exercise regularly at least 30 minutes 5 times a week. If you develop chest pain, have severe difficulty breathing, or feel very tired, stop exercising immediately and seek medical attention   Think about what you will eat, plan ahead. Choose " clean, green, fresh or frozen" over canned, processed or packaged foods which are more sugary, salty and fatty. 70 to 75% of food eaten should be vegetables and fruit. Three meals at set times with snacks allowed between meals, but they must be fruit or vegetables. Aim to eat over a 12 hour period , example 7 am to 7 pm, and STOP after  your last meal of the day. Drink water,generally about 64 ounces per day, no other drink is as healthy. Fruit juice is best enjoyed in a healthy way, by EATING the fruit.  Thanks for choosing [facility], we consider it a privelige to serve you.

## 2018-07-28 ENCOUNTER — Encounter: Payer: Self-pay | Admitting: Family Medicine

## 2018-07-28 ENCOUNTER — Other Ambulatory Visit: Payer: Self-pay

## 2018-07-28 ENCOUNTER — Ambulatory Visit (INDEPENDENT_AMBULATORY_CARE_PROVIDER_SITE_OTHER): Payer: Medicare HMO | Admitting: Family Medicine

## 2018-07-28 VITALS — BP 120/70 | Ht 63.0 in | Wt 167.0 lb

## 2018-07-28 DIAGNOSIS — Z0000 Encounter for general adult medical examination without abnormal findings: Secondary | ICD-10-CM

## 2018-07-28 NOTE — Progress Notes (Signed)
Subjective:   Rebecca Rodgers is a 63 y.o. female who presents for Medicare Annual (Subsequent) preventive examination.  Location of Patient: Home Location of Provider: Telehealth Consent was obtain for visit to be over via telehealth. I verified that I am speaking with the correct person using two identifiers.   Review of Systems:    Cardiac Risk Factors include: hypertension;dyslipidemia     Objective:     Vitals: BP 120/70   Ht 5\' 3"  (1.6 m)   Wt 167 lb (75.8 kg)   BMI 29.58 kg/m   Body mass index is 29.58 kg/m.  Advanced Directives 01/26/2018 07/23/2017 06/27/2016 05/21/2016 03/29/2016  Does Patient Have a Medical Advance Directive? Yes Yes No No No  Does patient want to make changes to medical advance directive? - No - Patient declined - - -  Would patient like information on creating a medical advance directive? - - - Yes (MAU/Ambulatory/Procedural Areas - Information given) No - Patient declined    Tobacco Social History   Tobacco Use  Smoking Status Former Smoker  . Packs/day: 0.25  . Years: 15.00  . Pack years: 3.75  . Types: Cigarettes  . Last attempt to quit: 03/09/2013  . Years since quitting: 5.3  Smokeless Tobacco Former Engineer, structural given: Not Answered   Clinical Intake:  Pre-visit preparation completed: Yes  Pain : No/denies pain Pain Score: 0-No pain     Nutritional Status: BMI 25 -29 Overweight Nutritional Risks: None Diabetes: No  How often do you need to have someone help you when you read instructions, pamphlets, or other written materials from your doctor or pharmacy?: 1 - Never What is the last grade level you completed in school?: 12+ some college  Interpreter Needed?: No     Past Medical History:  Diagnosis Date  . Asthma   . Hx of hysterectomy   . Hyperlipidemia   . Hypertension   . Hypothyroidism   . Suicide attempt ([place]) 1997   when a relationship broke up    . Suicide attempt ([place])    3 months ago because of  ill health of her parents   . Uterine cancer ([place]) 2005   Past Surgical History:  Procedure Laterality Date  . ABDOMINAL HYSTERECTOMY    . CARPAL TUNNEL RELEASE  1998   bilateral    . COLONOSCOPY  12/20/2010   Procedure: COLONOSCOPY;  Surgeon: Rogene Houston, MD;  [facility];  Service: Endoscopy;  Laterality: N/A;  . COLONOSCOPY N/A 06/27/2016   Procedure: COLONOSCOPY;  Surgeon: Rogene Houston, MD;  Location[facility];  Service: Endoscopy;  Laterality: N/A;  1030  . DILATION AND CURETTAGE OF UTERUS     at 20   . VESICOVAGINAL FISTULA CLOSURE W/ TAH  2002   dur to uterine cancer    Family History  Problem Relation Age of Onset  . Cataracts Mother   . Hypertension Mother   . Hyperlipidemia Mother   . Dementia Mother   . Depression Mother   . Coronary artery disease Father   . Hyperlipidemia Father   . Stroke Father   . Cancer Sister 66       breast   . Cancer Brother 56       prostate   Social History   Socioeconomic History  . Marital status: Single    Spouse name: Not on file  . Number of children: Not on file  . Years of education: Not  on file  . Highest education level: Not on file  Occupational History  . Occupation: disabled  Social Needs  . Financial resource strain: Hard  . Food insecurity:    Worry: Never true    Inability: Never true  . Transportation needs:    Medical: No    Non-medical: No  Tobacco Use  . Smoking status: Former Smoker    Packs/day: 0.25    Years: 15.00    Pack years: 3.75    Types: Cigarettes    Last attempt to quit: 03/09/2013    Years since quitting: 5.3  . Smokeless tobacco: Former Network engineer and Sexual Activity  . Alcohol use: No    Alcohol/week: 0.0 standard drinks  . Drug use: No  . Sexual activity: Not Currently    Birth control/protection: Other-see comments, Surgical  Lifestyle  . Physical activity:    Days per week: 3 days    Minutes per session: 30 min  . Stress: Rather much   Relationships  . Social connections:    Talks on phone: More than three times a week    Gets together: More than three times a week    Attends religious service: Never    Active member of club or organization: No    Attends meetings of clubs or organizations: Never    Relationship status: Divorced  Other Topics Concern  . Not on file  Social History Narrative   Partner passed in the summer of 2018. This has been very difficult on her.     Outpatient Encounter Medications as of 07/28/2018  Medication Sig  . acetaminophen (TYLENOL) 500 MG tablet Take 500 mg by mouth every 6 (six) hours as needed (pain).  Marland Kitchen albuterol (PROVENTIL HFA;VENTOLIN HFA) 108 (90 Base) MCG/ACT inhaler Inhale 2 puffs into the lungs every 6 (six) hours as needed for wheezing or shortness of breath.  Marland Kitchen amLODipine (NORVASC) 10 MG tablet TAKE 1 TABLET DAILY  . buPROPion (WELLBUTRIN XL) 150 MG 24 hr tablet Take 1 tablet (150 mg total) by mouth daily.  . cholecalciferol (VITAMIN D) 1000 units tablet Take 2,000 Units by mouth every Tuesday.   . clotrimazole-betamethasone (LOTRISONE) cream APPLY TO AFFECTED AREAS TWICE A DAY  . ezetimibe (ZETIA) 10 MG tablet Take 1 tablet (10 mg total) by mouth daily.  Marland Kitchen lamoTRIgine (LAMICTAL) 200 MG tablet Take 1 tablet (200 mg total) by mouth daily.  Marland Kitchen levothyroxine (SYNTHROID, LEVOTHROID) 50 MCG tablet TAKE 1 TABLET DAILY MON-SAT AND 1&1/2 TABLETS ON SUN  . montelukast (SINGULAIR) 10 MG tablet Take 1 tablet (10 mg total) by mouth at bedtime.  Marland Kitchen OLANZapine (ZYPREXA) 5 MG tablet Take 1 tablet (5 mg total) by mouth at bedtime.  Marland Kitchen omeprazole (PRILOSEC) 20 MG capsule Take 20 mg by mouth daily.   . rosuvastatin (CRESTOR) 40 MG tablet TAKE 1 TABLET DAILY AT BEDTIME FOR CHOLESTEROL  . SYMBICORT 160-4.5 MCG/ACT inhaler 2 PUFFS 2 TIMES A DAY  . traZODone (DESYREL) 100 MG tablet Take 1 tablet (100 mg total) by mouth at bedtime as needed for sleep.  Marland Kitchen triamterene-hydrochlorothiazide (MAXZIDE-25)  37.5-25 MG tablet Take 1 tablet by mouth daily.  . VOLTAREN 1 % GEL APPLY TO THE AFFECTED AREA(S) TWICE A DAY AS NEEDED FOR PAIN   No facility-administered encounter medications on file as of 07/28/2018.     Activities of Daily Living In your present state of health, do you have any difficulty performing the following activities: 07/28/2018  Hearing? N  Vision? N  Difficulty concentrating or making decisions? N  Walking or climbing stairs? N  Dressing or bathing? N  Doing errands, shopping? N  Preparing Food and eating ? N  Using the Toilet? N  In the past six months, have you accidently leaked urine? N  Do you have problems with loss of bowel control? N  Managing your Medications? N  Managing your Finances? N  Housekeeping or managing your Housekeeping? N  Some recent data might be hidden    Patient Care Team: Fayrene Helper, MD as PCP - General Cloria Spring, MD as Consulting Physician [facility])    Assessment:   This is a routine wellness examination for Zitlaly.  Exercise Activities and Dietary recommendations Current Exercise Habits: The patient does not participate in regular exercise at present, Exercise limited by: None identified  Goals    . decrease carbohydrates     Recommend decreasing simple carbohydrates.       Fall Risk Fall Risk  07/28/2018 07/27/2018 02/23/2018 07/23/2017 05/21/2016  Falls in the past year? 0 0 0 No Yes  Number falls in past yr: - - 0 - 2 or more  Injury with Fall? 0 0 0 - Yes  Follow up - - - - Falls evaluation completed;Falls prevention discussed  Comment - - - - reveals patient accidentally stumbled   Is the patient's home free of loose throw rugs in walkways, pet beds, electrical cords, etc?   yes      Grab bars in the bathroom? in shower      Handrails on the stairs?   no stairs      Adequate lighting?   yes  Timed Get Up and Go performed:  Telemedicine n/a  Depression Screen PHQ 2/9 Scores 07/28/2018 07/27/2018  02/23/2018 07/23/2017  PHQ - 2 Score 0 0 2 2  PHQ- 9 Score - - 5 -     Cognitive Function     6CIT Screen 07/28/2018 07/23/2017 05/21/2016  What Year? 0 points 0 points 0 points  What month? 0 points 0 points 0 points  What time? 0 points 0 points 0 points  Count back from 20 0 points 0 points 0 points  Months in reverse 0 points 0 points 0 points  Repeat phrase 2 points 0 points 0 points  Total Score 2 0 0    Immunization History  Administered Date(s) Administered  . Influenza Split 01/08/2012, 12/08/2013, 01/06/2015  . Influenza Whole 12/29/2008, 11/28/2010  . Influenza,inj,Quad PF,6+ Mos 01/15/2013, 02/20/2016, 02/19/2017, 02/23/2018  . Pneumococcal Conjugate-13 10/26/2013  . Tdap 03/26/2010, 09/17/2016  . Zoster Recombinat (Shingrix) 11/05/2016    Qualifies for Shingles Vaccine? Completed  Screening Tests Health Maintenance  Topic Date Due  . INFLUENZA VACCINE  10/24/2018  . MAMMOGRAM  09/23/2019  . PAP SMEAR-Modifier  02/23/2021  . COLONOSCOPY  06/27/2021  . TETANUS/TDAP  09/18/2026  . Hepatitis C Screening  Completed  . HIV Screening  Completed    Cancer Screenings: Lung: Low Dose CT Chest recommended if Age 55-80 years, 30 pack-year currently smoking OR have quit w/in 15years. Patient does not qualify. Breast:  Up to date on Mammogram? Yes   Up to date of Bone Density/Dexa? No Discussed with Dr Moshe Cipro Colorectal:  Up to date Due 2023  Additional Screenings:   Hepatitis C Screening: Completed      Plan:      1. Encounter for Medicare annual wellness exam I have personally reviewed and noted the following in the patient's chart:   .  Medical and social history . Use of alcohol, tobacco or illicit drugs  . Current medications and supplements . Functional ability and status . Nutritional status . Physical activity . Advanced directives . List of other physicians . Hospitalizations, surgeries, and ER visits in previous 12 months . Vitals . Screenings to  include cognitive, depression, and falls . Referrals and appointments  In addition, I have reviewed and discussed with patient certain preventive protocols, quality metrics, and best practice recommendations. A written personalized care plan for preventive services as well as general preventive health recommendations were provided to patient.    I provided 20 minutes of non-face-to-face time during this encounter.   Perlie Mayo, NP  07/28/2018

## 2018-07-28 NOTE — Patient Instructions (Signed)
Rebecca Rodgers , Thank you for taking time to come for your Medicare Wellness Visit. I appreciate your ongoing commitment to your health goals. Please review the following plan we discussed and let me know if I can assist you in the future.   Screening recommendations/referrals: Colonoscopy: Due 2023 Mammogram: Due 2020 Bone Density: Due -Discuss with Dr Moshe Cipro Recommended yearly ophthalmology/optometry visit for glaucoma screening and checkup Recommended yearly dental visit for hygiene and checkup  Vaccinations: Influenza vaccine: Due Fall 2020 Pneumococcal vaccine: 13 completed, 23 could be administered.  Tdap vaccine: Up to date Shingles vaccine: Up  To date  Next appointment: 12/09/2018 at [place] 40-64 Years, Female Preventive care refers to lifestyle choices and visits with your health care provider that can promote health and wellness. What does preventive care include?  A yearly physical exam. This is also called an annual well check.  Dental exams once or twice a year.  Routine eye exams. Ask your health care provider how often you should have your eyes checked.  Personal lifestyle choices, including:  Daily care of your teeth and gums.  Regular physical activity.  Eating a healthy diet.  Avoiding tobacco and drug use.  Limiting alcohol use.  Practicing safe sex.  Taking low-dose aspirin daily starting at age 68.  Taking vitamin and mineral supplements as recommended by your health care provider. What happens during an annual well check? The services and screenings done by your health care provider during your annual well check will depend on your age, overall health, lifestyle risk factors, and family history of disease. Counseling  Your health care provider may ask you questions about your:  Alcohol use.  Tobacco use.  Drug use.  Emotional well-being.  Home and relationship well-being.  Sexual activity.  Eating habits.  Work and work  Statistician.  Method of birth control.  Menstrual cycle.  Pregnancy history. Screening  You may have the following tests or measurements:  Height, weight, and BMI.  Blood pressure.  Lipid and cholesterol levels. These may be checked every 5 years, or more frequently if you are over 39 years old.  Skin check.  Lung cancer screening. You may have this screening every year starting at age 42 if you have a 30-pack-year history of smoking and currently smoke or have quit within the past 15 years.  Fecal occult blood test (FOBT) of the stool. You may have this test every year starting at age 63.  Flexible sigmoidoscopy or colonoscopy. You may have a sigmoidoscopy every 5 years or a colonoscopy every 10 years starting at age 45.  Hepatitis C blood test.  Hepatitis B blood test.  Sexually transmitted disease (STD) testing.  Diabetes screening. This is done by checking your blood sugar (glucose) after you have not eaten for a while (fasting). You may have this done every 1-3 years.  Mammogram. This may be done every 1-2 years. Talk to your health care provider about when you should start having regular mammograms. This may depend on whether you have a family history of breast cancer.  BRCA-related cancer screening. This may be done if you have a family history of breast, ovarian, tubal, or peritoneal cancers.  Pelvic exam and Pap test. This may be done every 3 years starting at age 28. Starting at age 22, this may be done every 5 years if you have a Pap test in combination with an HPV test.  Bone density scan. This is done to screen for osteoporosis. You may  have this scan if you are at high risk for osteoporosis. Discuss your test results, treatment options, and if necessary, the need for more tests with your health care provider. Vaccines  Your health care provider may recommend certain vaccines, such as:  Influenza vaccine. This is recommended every year.  Tetanus, diphtheria,  and acellular pertussis (Tdap, Td) vaccine. You may need a Td booster every 10 years.  Zoster vaccine. You may need this after age 59.  Pneumococcal 13-valent conjugate (PCV13) vaccine. You may need this if you have certain conditions and were not previously vaccinated.  Pneumococcal polysaccharide (PPSV23) vaccine. You may need one or two doses if you smoke cigarettes or if you have certain conditions. Talk to your health care provider about which screenings and vaccines you need and how often you need them. This information is not intended to replace advice given to you by your health care provider. Make sure you discuss any questions you have with your health care provider. Document Released: 04/07/2015 Document Revised: 11/29/2015 Document Reviewed: 01/10/2015 Elsevier Interactive Patient Education  2017 [place] Prevention in the Home Falls can cause injuries. They can happen to people of all ages. There are many things you can do to make your home safe and to help prevent falls. What can I do on the outside of my home?  Regularly fix the edges of walkways and driveways and fix any cracks.  Remove anything that might make you trip as you walk through a door, such as a raised step or threshold.  Trim any bushes or trees on the path to your home.  Use bright outdoor lighting.  Clear any walking paths of anything that might make someone trip, such as rocks or tools.  Regularly check to see if handrails are loose or broken. Make sure that both sides of any steps have handrails.  Any raised decks and porches should have guardrails on the edges.  Have any leaves, snow, or ice cleared regularly.  Use sand or salt on walking paths during winter.  Clean up any spills in your garage right away. This includes oil or grease spills. What can I do in the bathroom?  Use night lights.  Install grab bars by the toilet and in the tub and shower. Do not use towel bars as grab  bars.  Use non-skid mats or decals in the tub or shower.  If you need to sit down in the shower, use a plastic, non-slip stool.  Keep the floor dry. Clean up any water that spills on the floor as soon as it happens.  Remove soap buildup in the tub or shower regularly.  Attach bath mats securely with double-sided non-slip rug tape.  Do not have throw rugs and other things on the floor that can make you trip. What can I do in the bedroom?  Use night lights.  Make sure that you have a light by your bed that is easy to reach.  Do not use any sheets or blankets that are too big for your bed. They should not hang down onto the floor.  Have a firm chair that has side arms. You can use this for support while you get dressed.  Do not have throw rugs and other things on the floor that can make you trip. What can I do in the kitchen?  Clean up any spills right away.  Avoid walking on wet floors.  Keep items that you use a lot in easy-to-reach  places.  If you need to reach something above you, use a strong step stool that has a grab bar.  Keep electrical cords out of the way.  Do not use floor polish or wax that makes floors slippery. If you must use wax, use non-skid floor wax.  Do not have throw rugs and other things on the floor that can make you trip. What can I do with my stairs?  Do not leave any items on the stairs.  Make sure that there are handrails on both sides of the stairs and use them. Fix handrails that are broken or loose. Make sure that handrails are as long as the stairways.  Check any carpeting to make sure that it is firmly attached to the stairs. Fix any carpet that is loose or worn.  Avoid having throw rugs at the top or bottom of the stairs. If you do have throw rugs, attach them to the floor with carpet tape.  Make sure that you have a light switch at the top of the stairs and the bottom of the stairs. If you do not have them, ask someone to add them for  you. What else can I do to help prevent falls?  Wear shoes that:  Do not have high heels.  Have rubber bottoms.  Are comfortable and fit you well.  Are closed at the toe. Do not wear sandals.  If you use a stepladder:  Make sure that it is fully opened. Do not climb a closed stepladder.  Make sure that both sides of the stepladder are locked into place.  Ask someone to hold it for you, if possible.  Clearly mark and make sure that you can see:  Any grab bars or handrails.  First and last steps.  Where the edge of each step is.  Use tools that help you move around (mobility aids) if they are needed. These include:  Canes.  Walkers.  Scooters.  Crutches.  Turn on the lights when you go into a dark area. Replace any light bulbs as soon as they burn out.  Set up your furniture so you have a clear path. Avoid moving your furniture around.  If any of your floors are uneven, fix them.  If there are any pets around you, be aware of where they are.  Review your medicines with your doctor. Some medicines can make you feel dizzy. This can increase your chance of falling. Ask your doctor what other things that you can do to help prevent falls. This information is not intended to replace advice given to you by your health care provider. Make sure you discuss any questions you have with your health care provider. Document Released: 01/05/2009 Document Revised: 08/17/2015 Document Reviewed: 04/15/2014 Elsevier Interactive Patient Education  2017 [employer].

## 2018-08-02 ENCOUNTER — Encounter: Payer: Self-pay | Admitting: Family Medicine

## 2018-08-02 NOTE — Progress Notes (Signed)
Virtual Visit via Telephone Note  I connected with Rebecca Rodgers on 08/02/18 at  1:40 PM EDT by telephone and verified that I am speaking with the correct person using two identifiers.  Location: Patient: home Provider: office   I discussed the limitations, risks, security and privacy concerns of performing an evaluation and management service by telephone and the availability of in person appointments. I also discussed with the patient that there may be a patient responsible charge related to this service. The patient expressed understanding and agreed to proceed.   History of Present Illness: F/u chronic problems and lab review Reports doing well o verall, still cries and misses her partner , but states family very supportive and they visit together often Denies recent fever or chills. Denies sinus pressure, nasal congestion, ear pain or sore throat. Denies chest congestion, productive cough or wheezing. Denies chest pains, palpitations and leg swelling Denies abdominal pain, nausea, vomiting,diarrhea or constipation.   Denies dysuria, frequency, hesitancy or incontinence. Denies joint pain, swelling and limitation in mobility. Denies headaches, seizures, numbness, or tingling. Denies uncontrolled depression, anxiety or insomnia. Denies skin break down or rash.       Observations/Objective: BP 120/70   Ht 5\' 3"  (1.6 m)   Wt 167 lb (75.8 kg)   BMI 29.58 kg/m  Good communication with no confusion and intact memory. Alert and oriented x 3 No signs of respiratory distress during sppech    Assessment and Plan: Allergic rhinitis Controlled, no change in medication   Essential hypertension Controlled, no change in medication DASH diet and commitment to daily physical activity for a minimum of 30 minutes discussed and encouraged, as a part of hypertension management. The importance of attaining a healthy weight is also discussed.  BP/Weight 07/28/2018 07/27/2018 05/07/2018  04/01/2018 02/26/2018 02/23/2018 68/34/1962  Systolic BP 229 798 921 194 174 081 448  Diastolic BP 70 70 74 82 75 68 80  Wt. (Lbs) 167 167 162 162 160 164.04 160  BMI 29.58 29.58 28.7 28.7 28.34 29.06 28.34  Some encounter information is confidential and restricted. Go to Review Flowsheets activity to see all data.       Hyperlipidemia Hyperlipidemia:Low fat diet discussed and encouraged.   Lipid Panel  Lab Results  Component Value Date   CHOL 185 07/21/2018   HDL 70 07/21/2018   LDLCALC 99 07/21/2018   TRIG 74 07/21/2018   CHOLHDL 2.6 07/21/2018   Controlled, no change in medication     Hypothyroidism Controlled, no change in medication   IGT (impaired glucose tolerance) Patient educated about the importance of limiting  Carbohydrate intake , the need to commit to daily physical activity for a minimum of 30 minutes , and to commit weight loss. The fact that changes in all these areas will reduce or eliminate all together the development of diabetes is stressed.  Improving, she is applauded on this  Diabetic Labs Latest Ref Rng & Units 07/21/2018 02/24/2018 07/02/2017 01/29/2017 08/08/2016  HbA1c <5.7 % of total Hgb 5.7(H) 5.9(H) 5.8(H) 5.6 5.9(H)  Chol <200 mg/dL 185 181 171 212(H) 198  HDL > OR = 50 mg/dL 70 69 59 82 69  Calc LDL mg/dL (calc) 99 95 95 110(H) 110(H)  Triglycerides <150 mg/dL 74 77 83 95 93  Creatinine 0.50 - 0.99 mg/dL 0.99 1.15(H) 1.36(H) 1.14(H) 1.02(H)   BP/Weight 07/28/2018 07/27/2018 05/07/2018 04/01/2018 02/26/2018 02/23/2018 18/56/3149  Systolic BP 702 637 858 850 277 412 878  Diastolic BP 70 70 74 82  75 68 80  Wt. (Lbs) 167 167 162 162 160 164.04 160  BMI 29.58 29.58 28.7 28.7 28.34 29.06 28.34  Some encounter information is confidential and restricted. Go to Review Flowsheets activity to see all data.   No flowsheet data found.      Follow Up Instructions:    I discussed the assessment and treatment plan with the patient. The patient was provided  an opportunity to ask questions and all were answered. The patient agreed with the plan and demonstrated an understanding of the instructions.   The patient was advised to call back or seek an in-person evaluation if the symptoms worsen or if the condition fails to improve as anticipated.  I provided 25 minutes of non-face-to-face time during this encounter.   Tula Nakayama, MD

## 2018-08-02 NOTE — Assessment & Plan Note (Signed)
Controlled, no change in medication DASH diet and commitment to daily physical activity for a minimum of 30 minutes discussed and encouraged, as a part of hypertension management. The importance of attaining a healthy weight is also discussed.  BP/Weight 07/28/2018 07/27/2018 05/07/2018 04/01/2018 02/26/2018 02/23/2018 27/61/8485  Systolic BP 927 639 432 003 794 446 190  Diastolic BP 70 70 74 82 75 68 80  Wt. (Lbs) 167 167 162 162 160 164.04 160  BMI 29.58 29.58 28.7 28.7 28.34 29.06 28.34  Some encounter information is confidential and restricted. Go to Review Flowsheets activity to see all data.

## 2018-08-02 NOTE — Assessment & Plan Note (Signed)
Controlled, no change in medication  

## 2018-08-02 NOTE — Assessment & Plan Note (Signed)
Hyperlipidemia:Low fat diet discussed and encouraged.   Lipid Panel  Lab Results  Component Value Date   CHOL 185 07/21/2018   HDL 70 07/21/2018   LDLCALC 99 07/21/2018   TRIG 74 07/21/2018   CHOLHDL 2.6 07/21/2018   Controlled, no change in medication

## 2018-08-02 NOTE — Assessment & Plan Note (Signed)
Patient educated about the importance of limiting  Carbohydrate intake , the need to commit to daily physical activity for a minimum of 30 minutes , and to commit weight loss. The fact that changes in all these areas will reduce or eliminate all together the development of diabetes is stressed.  Improving, she is applauded on this  Diabetic Labs Latest Ref Rng & Units 07/21/2018 02/24/2018 07/02/2017 01/29/2017 08/08/2016  HbA1c <5.7 % of total Hgb 5.7(H) 5.9(H) 5.8(H) 5.6 5.9(H)  Chol <200 mg/dL 185 181 171 212(H) 198  HDL > OR = 50 mg/dL 70 69 59 82 69  Calc LDL mg/dL (calc) 99 95 95 110(H) 110(H)  Triglycerides <150 mg/dL 74 77 83 95 93  Creatinine 0.50 - 0.99 mg/dL 0.99 1.15(H) 1.36(H) 1.14(H) 1.02(H)   BP/Weight 07/28/2018 07/27/2018 05/07/2018 04/01/2018 02/26/2018 02/23/2018 37/29/0211  Systolic BP 155 208 022 336 122 449 753  Diastolic BP 70 70 74 82 75 68 80  Wt. (Lbs) 167 167 162 162 160 164.04 160  BMI 29.58 29.58 28.7 28.7 28.34 29.06 28.34  Some encounter information is confidential and restricted. Go to Review Flowsheets activity to see all data.   No flowsheet data found.

## 2018-08-03 ENCOUNTER — Other Ambulatory Visit: Payer: Self-pay | Admitting: Family Medicine

## 2018-08-04 ENCOUNTER — Other Ambulatory Visit: Payer: Self-pay | Admitting: Family Medicine

## 2018-08-27 DIAGNOSIS — H2510 Age-related nuclear cataract, unspecified eye: Secondary | ICD-10-CM | POA: Diagnosis not present

## 2018-08-27 DIAGNOSIS — Z135 Encounter for screening for eye and ear disorders: Secondary | ICD-10-CM | POA: Diagnosis not present

## 2018-08-27 DIAGNOSIS — H524 Presbyopia: Secondary | ICD-10-CM | POA: Diagnosis not present

## 2018-08-27 DIAGNOSIS — H04123 Dry eye syndrome of bilateral lacrimal glands: Secondary | ICD-10-CM | POA: Diagnosis not present

## 2018-08-27 DIAGNOSIS — H259 Unspecified age-related cataract: Secondary | ICD-10-CM | POA: Diagnosis not present

## 2018-08-31 ENCOUNTER — Other Ambulatory Visit: Payer: Self-pay | Admitting: Family Medicine

## 2018-09-03 ENCOUNTER — Other Ambulatory Visit: Payer: Self-pay | Admitting: Family Medicine

## 2018-09-24 ENCOUNTER — Other Ambulatory Visit: Payer: Self-pay | Admitting: Family Medicine

## 2018-10-07 ENCOUNTER — Other Ambulatory Visit: Payer: Self-pay | Admitting: Family Medicine

## 2018-10-12 DIAGNOSIS — H6982 Other specified disorders of Eustachian tube, left ear: Secondary | ICD-10-CM | POA: Diagnosis not present

## 2018-10-12 DIAGNOSIS — H60332 Swimmer's ear, left ear: Secondary | ICD-10-CM | POA: Diagnosis not present

## 2018-11-02 ENCOUNTER — Other Ambulatory Visit: Payer: Self-pay | Admitting: Family Medicine

## 2018-11-06 ENCOUNTER — Other Ambulatory Visit: Payer: Self-pay

## 2018-11-06 ENCOUNTER — Ambulatory Visit (HOSPITAL_COMMUNITY)
Admission: RE | Admit: 2018-11-06 | Discharge: 2018-11-06 | Disposition: A | Discharge status: Home / Self Care | Payer: Medicare HMO | Source: Ambulatory Visit | Attending: Family Medicine | Admitting: Family Medicine

## 2018-11-06 DIAGNOSIS — Z1231 Encounter for screening mammogram for malignant neoplasm of breast: Secondary | ICD-10-CM | POA: Diagnosis not present

## 2018-11-27 ENCOUNTER — Other Ambulatory Visit: Payer: Self-pay | Admitting: Family Medicine

## 2018-12-03 ENCOUNTER — Ambulatory Visit: Payer: Medicare HMO | Admitting: Physician Assistant

## 2018-12-09 ENCOUNTER — Other Ambulatory Visit: Payer: Self-pay

## 2018-12-09 ENCOUNTER — Ambulatory Visit (INDEPENDENT_AMBULATORY_CARE_PROVIDER_SITE_OTHER): Payer: Medicare HMO | Admitting: Family Medicine

## 2018-12-09 ENCOUNTER — Encounter (INDEPENDENT_AMBULATORY_CARE_PROVIDER_SITE_OTHER): Payer: Self-pay

## 2018-12-09 ENCOUNTER — Encounter: Payer: Self-pay | Admitting: Family Medicine

## 2018-12-09 DIAGNOSIS — Z23 Encounter for immunization: Secondary | ICD-10-CM | POA: Diagnosis not present

## 2018-12-09 DIAGNOSIS — Z0000 Encounter for general adult medical examination without abnormal findings: Secondary | ICD-10-CM | POA: Diagnosis not present

## 2018-12-09 NOTE — Patient Instructions (Signed)
Physical exam with mD Sept 17 or after , 2021, call if you need me before  Flu vaccine today  Please get fasting labs in the next 1 to 2 weeks  Continue  to work on healthy food choice  Increase exercise to a 30 minute per day at least 5 days per week commitment   Thanks for choosing [facility], we consider it a privelige to serve you.

## 2018-12-09 NOTE — Progress Notes (Signed)
    Rebecca Rodgers     MRN: [id number]      DOB: [birth year]  HPI: Patient is in for annual physical exam. No other health concerns are expressed or addressed at the visit. Needs updated labs Immunization is reviewed , and  updated .   PE: BP 118/78   Pulse 83   Temp 98.3 F (36.8 C) (Temporal)   Resp 15   Ht 5\' 3"  (1.6 m)   Wt 167 lb (75.8 kg)   SpO2 97%   BMI 29.58 kg/m   Pleasant  female, alert and oriented x 3, in no cardio-pulmonary distress. Afebrile. HEENT No facial trauma or asymetry. Sinuses non tender.  Extra occullar muscles intact External ears normal, tympanic membranes clear. . Neck: supple, no adenopathy,JVD or thyromegaly.No bruits.  Chest: Clear to ascultation bilaterally.No crackles or wheezes. Non tender to palpation  Breast: Not examined, mammogram in 10/2018 is normal  Cardiovascular system; Heart sounds normal,  S1 and  S2 ,no S3.  No murmur, or thrill. Apical beat not displaced Peripheral pulses normal.  Abdomen: Soft, non tender, no organomegaly or masses. No bruits. Bowel sounds normal. No guarding, tenderness or rebound.    GU: No exam indicated currently, asymptomatic.   Musculoskeletal exam: Full ROM of spine, hips , shoulders and knees. No deformity ,swelling or crepitus noted. No muscle wasting or atrophy.   Neurologic: Cranial nerves 2 to 12 intact. Power, tone ,sensation and reflexes normal throughout. No disturbance in gait. No tremor.  Skin: Intact, no ulceration, erythema , scaling or rash noted. Pigmentation normal throughout  Psych; Normal mood and affect. Judgement and concentration normal   Assessment & Plan:  Annual physical exam Annual exam as documented. Counseling done  re healthy lifestyle involving commitment to 150 minutes exercise per week, heart healthy diet, and attaining healthy weight.The importance of adequate sleep also discussed. Regular seat belt use and home safety, is also discussed.  Changes in health habits are decided on by the patient with goals and time frames  set for achieving them. Immunization and cancer screening needs are specifically addressed at this visit.   Need for immunization against influenza After obtaining informed consent, the vaccine is  administered , with no adverse effect noted at the time of administration.

## 2018-12-12 ENCOUNTER — Encounter: Payer: Self-pay | Admitting: Family Medicine

## 2018-12-12 NOTE — Assessment & Plan Note (Signed)

## 2018-12-12 NOTE — Assessment & Plan Note (Signed)
After obtaining informed consent, the vaccine is  administered , with no adverse effect noted at the time of administration.  

## 2018-12-22 ENCOUNTER — Other Ambulatory Visit: Payer: Self-pay | Admitting: Family Medicine

## 2018-12-22 ENCOUNTER — Other Ambulatory Visit: Payer: Self-pay | Admitting: Physician Assistant

## 2018-12-22 DIAGNOSIS — E782 Mixed hyperlipidemia: Secondary | ICD-10-CM | POA: Diagnosis not present

## 2018-12-22 DIAGNOSIS — E559 Vitamin D deficiency, unspecified: Secondary | ICD-10-CM | POA: Diagnosis not present

## 2018-12-22 DIAGNOSIS — F3162 Bipolar disorder, current episode mixed, moderate: Secondary | ICD-10-CM

## 2018-12-22 DIAGNOSIS — R7302 Impaired glucose tolerance (oral): Secondary | ICD-10-CM | POA: Diagnosis not present

## 2018-12-22 DIAGNOSIS — I10 Essential (primary) hypertension: Secondary | ICD-10-CM | POA: Diagnosis not present

## 2018-12-22 DIAGNOSIS — E038 Other specified hypothyroidism: Secondary | ICD-10-CM | POA: Diagnosis not present

## 2018-12-23 ENCOUNTER — Other Ambulatory Visit: Payer: Self-pay | Admitting: Physician Assistant

## 2018-12-23 ENCOUNTER — Other Ambulatory Visit: Payer: Self-pay | Admitting: Family Medicine

## 2018-12-23 DIAGNOSIS — F3162 Bipolar disorder, current episode mixed, moderate: Secondary | ICD-10-CM

## 2018-12-23 LAB — COMPLETE METABOLIC PANEL WITH GFR
AG Ratio: 1.6 (calc) (ref 1.0–2.5)
ALT: 21 U/L (ref 6–29)
AST: 23 U/L (ref 10–35)
Albumin: 4.7 g/dL (ref 3.6–5.1)
Alkaline phosphatase (APISO): 56 U/L (ref 37–153)
BUN/Creatinine Ratio: 14 (calc) (ref 6–22)
BUN: 17 mg/dL (ref 7–25)
CO2: 24 mmol/L (ref 20–32)
Calcium: 10.3 mg/dL (ref 8.6–10.4)
Chloride: 102 mmol/L (ref 98–110)
Creat: 1.19 mg/dL — ABNORMAL HIGH (ref 0.50–0.99)
GFR, Est African American: 57 mL/min/{1.73_m2} — ABNORMAL LOW (ref >=60)
GFR, Est Non African American: 49 mL/min/{1.73_m2} — ABNORMAL LOW (ref >=60)
Globulin: 3 g/dL (calc) (ref 1.9–3.7)
Glucose, Bld: 95 mg/dL (ref 65–99)
Potassium: 3.8 mmol/L (ref 3.5–5.3)
Sodium: 139 mmol/L (ref 135–146)
Total Bilirubin: 0.5 mg/dL (ref 0.2–1.2)
Total Protein: 7.7 g/dL (ref 6.1–8.1)

## 2018-12-23 LAB — HEMOGLOBIN A1C
Hgb A1c MFr Bld: 5.6 % of total Hgb (ref <5.7)
Mean Plasma Glucose: 114 (calc)
eAG (mmol/L): 6.3 (calc)

## 2018-12-23 LAB — VITAMIN D 25 HYDROXY (VIT D DEFICIENCY, FRACTURES): Vit D, 25-Hydroxy: 35 ng/mL (ref 30–100)

## 2018-12-23 LAB — CBC
HCT: 43.9 % (ref 35.0–45.0)
Hemoglobin: 14.2 g/dL (ref 11.7–15.5)
MCH: 29.9 pg (ref 27.0–33.0)
MCHC: 32.3 g/dL (ref 32.0–36.0)
MCV: 92.4 fL (ref 80.0–100.0)
MPV: 10.4 fL (ref 7.5–12.5)
Platelets: 384 10*3/uL (ref 140–400)
RBC: 4.75 10*6/uL (ref 3.80–5.10)
RDW: 13.2 % (ref 11.0–15.0)
WBC: 6.9 10*3/uL (ref 3.8–10.8)

## 2018-12-23 LAB — LIPID PANEL
Cholesterol: 183 mg/dL (ref <200)
HDL: 65 mg/dL (ref >=50)
LDL Cholesterol (Calc): 101 mg/dL (calc) — ABNORMAL HIGH
Non-HDL Cholesterol (Calc): 118 mg/dL (calc) (ref <130)
Total CHOL/HDL Ratio: 2.8 (calc) (ref <5.0)
Triglycerides: 84 mg/dL (ref <150)

## 2018-12-23 LAB — TSH: TSH: 1.6 mIU/L (ref 0.40–4.50)

## 2018-12-29 ENCOUNTER — Ambulatory Visit: Payer: Medicare HMO | Admitting: Physician Assistant

## 2019-01-06 ENCOUNTER — Telehealth: Payer: Self-pay | Admitting: *Deleted

## 2019-01-06 NOTE — Telephone Encounter (Signed)
Patient would like last lab orders sent to dr Donnal Moat at [facility] fax number [phone]

## 2019-01-07 NOTE — Telephone Encounter (Signed)
Last labs faxed to requested doctor.

## 2019-01-15 ENCOUNTER — Encounter: Payer: Self-pay | Admitting: Physician Assistant

## 2019-01-15 ENCOUNTER — Ambulatory Visit (INDEPENDENT_AMBULATORY_CARE_PROVIDER_SITE_OTHER): Payer: Medicare HMO | Admitting: Physician Assistant

## 2019-01-15 ENCOUNTER — Other Ambulatory Visit: Payer: Self-pay

## 2019-01-15 DIAGNOSIS — F319 Bipolar disorder, unspecified: Secondary | ICD-10-CM

## 2019-01-15 DIAGNOSIS — G4700 Insomnia, unspecified: Secondary | ICD-10-CM

## 2019-01-15 NOTE — Progress Notes (Signed)
[facility]  Patient ID: Rebecca Rodgers,  MRN: [id number]  PCP: Fayrene Helper, MD  Date of Evaluation: 01/15/2019 Time spent:15 minutes  Chief Complaint:  Chief Complaint    Follow-up     Virtual Visit via Telephone Note  I connected with patient by a video enabled telemedicine application or telephone, with their informed consent, and verified patient privacy and that I am speaking with the correct person using two identifiers.  I am private, in my office and the patient is at home.   I discussed the limitations, risks, security and privacy concerns of performing an evaluation and management service by telephone and the availability of in person appointments. I also discussed with the patient that there may be a patient responsible charge related to this service. The patient expressed understanding and agreed to proceed.   I discussed the assessment and treatment plan with the patient. The patient was provided an opportunity to ask questions and all were answered. The patient agreed with the plan and demonstrated an understanding of the instructions.   The patient was advised to call back or seek an in-person evaluation if the symptoms worsen or if the condition fails to improve as anticipated.  I provided 15 minutes of non-face-to-face time during this encounter.  HISTORY/CURRENT STATUS: HPI Here for routine 6 month med check.    Doing well. No new problems. Sleeping well as long as she takes the Trazodone.   Patient denies loss of interest in usual activities and is able to enjoy things.  Denies decreased energy or motivation.  Appetite has not changed.  No extreme sadness, tearfulness, or feelings of hopelessness.  Denies any changes in concentration, making decisions or remembering things.  Denies suicidal or homicidal thoughts.  Patient denies increased energy with decreased need for sleep, no increased talkativeness, no racing thoughts, no impulsivity or  risky behaviors, no increased spending, no increased libido, no grandiosity.  Denies dizziness, syncope, seizures, numbness, tingling, tremor, tics, unsteady gait, slurred speech, confusion.  Denies muscle or joint pain, stiffness, or dystonia.  Individual Medical History/ Review of Systems: Changes? :No    Past medications for mental health diagnoses include: Lamictal, Trazodone, Zyprexa, Wellbutrin, others she can't remember.   Allergies: Codeine and Penicillins  Current Medications:  Current Outpatient Medications:  .  acetaminophen (TYLENOL) 500 MG tablet, Take 500 mg by mouth every 6 (six) hours as needed (pain)., Disp: , Rfl:  .  albuterol (PROVENTIL HFA;VENTOLIN HFA) 108 (90 Base) MCG/ACT inhaler, Inhale 2 puffs into the lungs every 6 (six) hours as needed for wheezing or shortness of breath., Disp: 1 Inhaler, Rfl: 0 .  amLODipine (NORVASC) 10 MG tablet, TAKE 1 TABLET DAILY, Disp: 90 tablet, Rfl: 0 .  buPROPion (WELLBUTRIN XL) 150 MG 24 hr tablet, Take 1 tablet (150 mg total) by mouth daily., Disp: 90 tablet, Rfl: 0 .  cholecalciferol (VITAMIN D) 1000 units tablet, Take 2,000 Units by mouth every Tuesday. , Disp: , Rfl:  .  clotrimazole-betamethasone (LOTRISONE) cream, APPLY TO AFFECTED AREAS TWICE A DAY, Disp: 45 g, Rfl: 0 .  ezetimibe (ZETIA) 10 MG tablet, Take 1 tablet (10 mg total) by mouth daily., Disp: 90 tablet, Rfl: 0 .  lamoTRIgine (LAMICTAL) 200 MG tablet, Take 1 tablet (200 mg total) by mouth daily., Disp: 90 tablet, Rfl: 0 .  levothyroxine (SYNTHROID) 50 MCG tablet, TAKE 1 TABLET DAILY MON-SAT AND 1&1/2 TABLETS ON SUN, Disp: 100 tablet, Rfl: 0 .  montelukast (SINGULAIR) 10 MG tablet,  Take 1 tablet (10 mg total) by mouth at bedtime., Disp: 90 tablet, Rfl: 0 .  OLANZapine (ZYPREXA) 5 MG tablet, Take 1 tablet (5 mg total) by mouth at bedtime., Disp: 90 tablet, Rfl: 0 .  omeprazole (PRILOSEC) 20 MG capsule, Take 20 mg by mouth daily. , Disp: , Rfl:  .  rosuvastatin (CRESTOR)  40 MG tablet, TAKE 1 TABLET DAILY AT BEDTIME FOR CHOLESTEROL, Disp: 90 tablet, Rfl: 0 .  SYMBICORT 160-4.5 MCG/ACT inhaler, 2 PUFFS 2 TIMES A DAY, Disp: 30.6 g, Rfl: 3 .  traZODone (DESYREL) 100 MG tablet, Take 1 tablet (100 mg total) by mouth at bedtime as needed for sleep., Disp: 90 tablet, Rfl: 0 .  triamterene-hydrochlorothiazide (MAXZIDE-25) 37.5-25 MG tablet, Take 1 tablet by mouth daily., Disp: 90 tablet, Rfl: 0 .  VOLTAREN 1 % GEL, APPLY TO THE AFFECTED AREA(S) TWICE A DAY AS NEEDED FOR PAIN, Disp: 100 g, Rfl: 0 Medication Side Effects: none  Family Medical/ Social History: Changes? No  MENTAL HEALTH EXAM:  There were no vitals taken for this visit.There is no height or weight on file to calculate BMI.  General Appearance: unable to assess  Eye Contact:  unable to assess  Speech:  Clear and Coherent  Volume:  Normal  Mood:  Euthymic  Affect:  Appropriate  Thought Process:  Goal Directed and Descriptions of Associations: Intact  Orientation:  Full (Time, Place, and Person)  Thought Content: Logical   Suicidal Thoughts:  No  Homicidal Thoughts:  No  Memory:  WNL  Judgement:  Good  Insight:  Good  Psychomotor Activity:  unable to assess  Concentration:  Concentration: Good  Recall:  Good  Fund of Knowledge: Good  Language: Good  Assets:  Desire for Improvement  ADL's:  Intact  Cognition: WNL  Prognosis:  Good  12/22/2018 Vitamin D 35, CBC nl, Glu 95, BUN 17, Cr 1.19, LFTs nl, Hgb A1C 5.6, Lipid panel nl ,  DIAGNOSES:    ICD-10-CM   1. Bipolar 1 disorder (HCC)  F31.9   2. Insomnia, unspecified type  G47.00     Receiving Psychotherapy: No    RECOMMENDATIONS:  Continue Lamictal 200 mg daily. Continue trazodone 100 mg nightly as needed. Continue Wellbutrin XL 150 mg every morning. Continue Zyprexa 5 mg 1 nightly. Return in 6 months or sooner as needed.  Donnal Moat, PA-C

## 2019-01-26 ENCOUNTER — Other Ambulatory Visit: Payer: Self-pay | Admitting: Family Medicine

## 2019-03-01 ENCOUNTER — Other Ambulatory Visit: Payer: Self-pay

## 2019-03-01 MED ORDER — ROSUVASTATIN CALCIUM 40 MG PO TABS: 40.0000 mg | ORAL_TABLET | Freq: Every day | ORAL | 0 refills | Status: DC

## 2019-03-01 MED ORDER — AMLODIPINE BESYLATE 10 MG PO TABS: 10.0000 mg | ORAL_TABLET | Freq: Every day | ORAL | 0 refills | Status: DC

## 2019-03-20 ENCOUNTER — Other Ambulatory Visit: Payer: Self-pay | Admitting: Family Medicine

## 2019-03-20 ENCOUNTER — Other Ambulatory Visit: Payer: Self-pay | Admitting: Physician Assistant

## 2019-03-20 DIAGNOSIS — F3162 Bipolar disorder, current episode mixed, moderate: Secondary | ICD-10-CM

## 2019-03-29 ENCOUNTER — Other Ambulatory Visit: Payer: Self-pay | Admitting: Family Medicine

## 2019-03-30 ENCOUNTER — Other Ambulatory Visit: Payer: Self-pay

## 2019-03-30 MED ORDER — OMEPRAZOLE 20 MG PO CPDR: 20.0000 mg | DELAYED_RELEASE_CAPSULE | Freq: Every day | ORAL | 1 refills | Status: DC

## 2019-04-21 ENCOUNTER — Other Ambulatory Visit: Payer: Self-pay | Admitting: Family Medicine

## 2019-05-26 ENCOUNTER — Other Ambulatory Visit: Payer: Self-pay | Admitting: Family Medicine

## 2019-06-15 ENCOUNTER — Other Ambulatory Visit: Payer: Self-pay | Admitting: Family Medicine

## 2019-07-01 ENCOUNTER — Other Ambulatory Visit: Payer: Self-pay | Admitting: Family Medicine

## 2019-07-07 DIAGNOSIS — E7800 Pure hypercholesterolemia, unspecified: Secondary | ICD-10-CM | POA: Diagnosis not present

## 2019-07-07 DIAGNOSIS — H2513 Age-related nuclear cataract, bilateral: Secondary | ICD-10-CM | POA: Diagnosis not present

## 2019-07-07 DIAGNOSIS — H35363 Drusen (degenerative) of macula, bilateral: Secondary | ICD-10-CM | POA: Diagnosis not present

## 2019-07-07 DIAGNOSIS — H5200 Hypermetropia, unspecified eye: Secondary | ICD-10-CM | POA: Diagnosis not present

## 2019-07-07 DIAGNOSIS — Z0100 Encounter for examination of eyes and vision without abnormal findings: Secondary | ICD-10-CM | POA: Diagnosis not present

## 2019-07-16 ENCOUNTER — Encounter: Payer: Self-pay | Admitting: Physician Assistant

## 2019-07-16 ENCOUNTER — Telehealth (INDEPENDENT_AMBULATORY_CARE_PROVIDER_SITE_OTHER): Payer: Medicare HMO | Admitting: Physician Assistant

## 2019-07-16 DIAGNOSIS — F319 Bipolar disorder, unspecified: Secondary | ICD-10-CM | POA: Diagnosis not present

## 2019-07-16 DIAGNOSIS — G4700 Insomnia, unspecified: Secondary | ICD-10-CM | POA: Diagnosis not present

## 2019-07-16 NOTE — Progress Notes (Signed)
[facility]  Patient ID: Rebecca Rodgers,  MRN: [id number]  PCP: Fayrene Helper, MD  Date of Evaluation: 07/16/2019 Time spent:20 minutes  Chief Complaint:  Chief Complaint    Medication Refill     Virtual Visit via Telephone Note  I connected with patient by a video enabled telemedicine application or telephone, with their informed consent, and verified patient privacy and that I am speaking with the correct person using two identifiers.  I am private, in my office and the patient is home.  I discussed the limitations, risks, security and privacy concerns of performing an evaluation and management service by video and the availability of in person appointments. I also discussed with the patient that there may be a patient responsible charge related to this service. The patient expressed understanding and agreed to proceed.   I discussed the assessment and treatment plan with the patient. The patient was provided an opportunity to ask questions and all were answered. The patient agreed with the plan and demonstrated an understanding of the instructions.   The patient was advised to call back or seek an in-person evaluation if the symptoms worsen or if the condition fails to improve as anticipated.  I provided 20 minutes of non-face-to-face time during this encounter.  HISTORY/CURRENT STATUS: HPI For routine med check.  Doing well.  Taking her meds as directed. Feels good.  She's been able to see her Mom in the NH and that has been good.  Patient denies loss of interest in usual activities and is able to enjoy things.  Denies decreased energy or motivation.  Appetite has not changed.  No extreme sadness, tearfulness, or feelings of hopelessness.  Denies any changes in concentration, making decisions or remembering things. Denies anxiety. Is able to sleep well with the Trazodone. Denies suicidal or homicidal thoughts.  Patient denies increased energy with decreased  need for sleep, no increased talkativeness, no racing thoughts, no impulsivity or risky behaviors, no increased spending, no increased libido, no grandiosity, no increased irritability or anger, and no hallucinations.  Denies dizziness, syncope, seizures, numbness, tingling, tremor, tics, unsteady gait, slurred speech, confusion. Denies muscle or joint pain, stiffness, or dystonia. Denies unexplained weight loss, frequent infections, or sores that heal slowly.  No polyphagia, polydipsia, or polyuria. Denies visual changes or paresthesias.   Individual Medical History/ Review of Systems: Changes? :Yes  New hearing aides. Bilateral.   Past medications for mental health diagnoses include: Lamictal, Trazodone, Zyprexa, Wellbutrin, others she can't remember.   Allergies: Codeine and Penicillins  Current Medications:  Current Outpatient Medications:  .  albuterol (PROVENTIL HFA;VENTOLIN HFA) 108 (90 Base) MCG/ACT inhaler, Inhale 2 puffs into the lungs every 6 (six) hours as needed for wheezing or shortness of breath., Disp: 1 Inhaler, Rfl: 0 .  amLODipine (NORVASC) 10 MG tablet, Take 1 tablet (10 mg total) by mouth daily., Disp: 90 tablet, Rfl: 0 .  buPROPion (WELLBUTRIN XL) 150 MG 24 hr tablet, Take 1 tablet (150 mg total) by mouth daily., Disp: 90 tablet, Rfl: 1 .  ezetimibe (ZETIA) 10 MG tablet, Take 1 tablet (10 mg total) by mouth daily., Disp: 90 tablet, Rfl: 0 .  lamoTRIgine (LAMICTAL) 200 MG tablet, Take 1 tablet (200 mg total) by mouth daily., Disp: 90 tablet, Rfl: 1 .  levothyroxine (SYNTHROID) 50 MCG tablet, TAKE 1 TABLET DAILY MON-SAT AND 1&1/2 TABLETS ON SUN, Disp: 100 tablet, Rfl: 0 .  montelukast (SINGULAIR) 10 MG tablet, Take 1 tablet (10 mg total) by  mouth at bedtime., Disp: 90 tablet, Rfl: 0 .  OLANZapine (ZYPREXA) 5 MG tablet, Take 1 tablet (5 mg total) by mouth at bedtime., Disp: 90 tablet, Rfl: 1 .  omeprazole (PRILOSEC) 20 MG capsule, Take 1 capsule (20 mg total) by mouth daily.,  Disp: 60 capsule, Rfl: 1 .  rosuvastatin (CRESTOR) 40 MG tablet, Take 1 tablet (40 mg total) by mouth daily., Disp: 90 tablet, Rfl: 0 .  SYMBICORT 160-4.5 MCG/ACT inhaler, 2 PUFFS 2 TIMES A DAY, Disp: 30.6 g, Rfl: 3 .  traZODone (DESYREL) 100 MG tablet, Take 1 tablet (100 mg total) by mouth at bedtime as needed for sleep., Disp: 90 tablet, Rfl: 1 .  triamterene-hydrochlorothiazide (MAXZIDE-25) 37.5-25 MG tablet, TAKE 1 TABLET ONCE DAILY, Disp: 90 tablet, Rfl: 0 .  VOLTAREN 1 % GEL, APPLY TO THE AFFECTED AREA(S) TWICE A DAY AS NEEDED FOR PAIN, Disp: 100 g, Rfl: 0 .  acetaminophen (TYLENOL) 500 MG tablet, Take 500 mg by mouth every 6 (six) hours as needed (pain)., Disp: , Rfl:  .  cholecalciferol (VITAMIN D) 1000 units tablet, Take 2,000 Units by mouth every Tuesday. , Disp: , Rfl:  .  clotrimazole-betamethasone (LOTRISONE) cream, APPLY TO AFFECTED AREAS TWICE A DAY (Patient not taking: Reported on 07/16/2019), Disp: 45 g, Rfl: 0 Medication Side Effects: none  Family Medical/ Social History: Changes? No  MENTAL HEALTH EXAM:  There were no vitals taken for this visit.There is no height or weight on file to calculate BMI.  General Appearance: Casual, Neat and Well Groomed  Eye Contact:  Good  Speech:  Clear and Coherent and Normal Rate  Volume:  Normal  Mood:  Euthymic  Affect:  Appropriate  Thought Process:  Goal Directed and Descriptions of Associations: Intact  Orientation:  Full (Time, Place, and Person)  Thought Content: Logical   Suicidal Thoughts:  No  Homicidal Thoughts:  No  Memory:  WNL  Judgement:  Good  Insight:  Good  Psychomotor Activity:  Normal  Concentration:  Concentration: Good  Recall:  Good  Fund of Knowledge: Good  Language: Good  Assets:  Desire for Improvement  ADL's:  Intact  Cognition: WNL  Prognosis:  Good    DIAGNOSES:    ICD-10-CM   1. Bipolar 1 disorder (HCC)  F31.9   2. Insomnia, unspecified type  G47.00     Receiving Psychotherapy: No     RECOMMENDATIONS:  PDMD reviewed. I spent 20 minutes with her. I am glad to see her doing so well! Continue Wellbutrin XL 150 mg, 1 p.o. daily. Continue Lamictal 200 mg, 1 p.o. daily. Continue Zyprexa 5 mg, 1 p.o. nightly. Continue trazodone 100 mg, 1 p.o. nightly as needed sleep. She will be due for labs this fall and will have her PCP fax the results to me. Return in 6 months.  Donnal Moat, PA-C

## 2019-07-26 ENCOUNTER — Other Ambulatory Visit: Payer: Self-pay | Admitting: Family Medicine

## 2019-08-02 ENCOUNTER — Telehealth (INDEPENDENT_AMBULATORY_CARE_PROVIDER_SITE_OTHER): Payer: Medicare HMO

## 2019-08-02 ENCOUNTER — Other Ambulatory Visit: Payer: Self-pay

## 2019-08-02 VITALS — Ht 63.0 in | Wt 172.0 lb

## 2019-08-02 DIAGNOSIS — Z1231 Encounter for screening mammogram for malignant neoplasm of breast: Secondary | ICD-10-CM

## 2019-08-02 DIAGNOSIS — Z0000 Encounter for general adult medical examination without abnormal findings: Secondary | ICD-10-CM

## 2019-08-02 NOTE — Progress Notes (Signed)
Subjective:   Rebecca Rodgers is a 64 y.o. female who presents for Medicare Annual (Subsequent) preventive examination.  Review of Systems:   Cardiac Risk Factors include: dyslipidemia;hypertension     Objective:     Vitals: Ht 5\' 3"  (1.6 m)   Wt 172 lb (78 kg)   BMI 30.47 kg/m   Body mass index is 30.47 kg/m.  Advanced Directives 01/26/2018 07/23/2017 06/27/2016 05/21/2016 03/29/2016  Does Patient Have a Medical Advance Directive? Yes Yes No No No  Does patient want to make changes to medical advance directive? - No - Patient declined - - -  Would patient like information on creating a medical advance directive? - - - Yes (MAU/Ambulatory/Procedural Areas - Information given) No - Patient declined    Tobacco Social History   Tobacco Use  Smoking Status Former Smoker  . Packs/day: 0.25  . Years: 15.00  . Pack years: 3.75  . Types: Cigarettes  . Quit date: 03/09/2013  . Years since quitting: 6.4  Smokeless Tobacco Former Engineer, structural given: Not Answered   Clinical Intake:  Pre-visit preparation completed: Yes  Pain : No/denies pain Pain Score: 0-No pain     Nutritional Status: BMI 25 -29 Overweight Diabetes: No  How often do you need to have someone help you when you read instructions, pamphlets, or other written materials from your doctor or pharmacy?: 1 - Never        Past Medical History:  Diagnosis Date  . Asthma   . Hx of hysterectomy   . Hyperlipidemia   . Hypertension   . Hypothyroidism   . Suicide attempt ([place]) 1997   when a relationship broke up    . Suicide attempt ([place])    3 months ago because of ill health of her parents   . Uterine cancer ([place]) 2005   Past Surgical History:  Procedure Laterality Date  . ABDOMINAL HYSTERECTOMY    . CARPAL TUNNEL RELEASE  1998   bilateral    . COLONOSCOPY  12/20/2010   Procedure: COLONOSCOPY;  Surgeon: Rogene Houston, MD;  [facility];  Service: Endoscopy;  Laterality: N/A;  .  COLONOSCOPY N/A 06/27/2016   Procedure: COLONOSCOPY;  Surgeon: Rogene Houston, MD;  Location: [facility];  Service: Endoscopy;  Laterality: N/A;  1030  . DILATION AND CURETTAGE OF UTERUS     at 20   . VESICOVAGINAL FISTULA CLOSURE W/ TAH  2002   dur to uterine cancer    Family History  Problem Relation Age of Onset  . Cataracts Mother   . Hypertension Mother   . Hyperlipidemia Mother   . Dementia Mother   . Depression Mother   . Coronary artery disease Father   . Hyperlipidemia Father   . Stroke Father   . Cancer Sister 34       breast   . Cancer Brother 58       prostate   Social History   Socioeconomic History  . Marital status: Single    Spouse name: Not on file  . Number of children: Not on file  . Years of education: Not on file  . Highest education level: Not on file  Occupational History  . Occupation: disabled  Tobacco Use  . Smoking status: Former Smoker    Packs/day: 0.25    Years: 15.00    Pack years: 3.75    Types: Cigarettes    Quit date: 03/09/2013    Years  since quitting: 6.4  . Smokeless tobacco: Former Network engineer and Sexual Activity  . Alcohol use: No    Alcohol/week: 0.0 standard drinks  . Drug use: No  . Sexual activity: Not Currently    Birth control/protection: Other-see comments, Surgical  Other Topics Concern  . Not on file  Social History Narrative   Partner passed in the summer of 2018. This has been very difficult on her.    Social Determinants of Health   Financial Resource Strain: Low Risk   . Difficulty of Paying Living Expenses: Not hard at all  Food Insecurity: No Food Insecurity  . Worried About Charity fundraiser in the Last Year: Never true  . Ran Out of Food in the Last Year: Never true  Transportation Needs: No Transportation Needs  . Lack of Transportation (Medical): No  . Lack of Transportation (Non-Medical): No  Physical Activity: Sufficiently Active  . Days of Exercise per Week: 5 days  . Minutes of  Exercise per Session: 30 min  Stress: No Stress Concern Present  . Feeling of Stress : Only a little  Social Connections: Moderately Isolated  . Frequency of Communication with Friends and Family: More than three times a week  . Frequency of Social Gatherings with Friends and Family: More than three times a week  . Attends Religious Services: Never  . Active Member of Clubs or Organizations: No  . Attends Archivist Meetings: Never  . Marital Status: Widowed    Outpatient Encounter Medications as of 08/02/2019  Medication Sig  . acetaminophen (TYLENOL) 500 MG tablet Take 500 mg by mouth every 6 (six) hours as needed (pain).  Marland Kitchen albuterol (PROVENTIL HFA;VENTOLIN HFA) 108 (90 Base) MCG/ACT inhaler Inhale 2 puffs into the lungs every 6 (six) hours as needed for wheezing or shortness of breath.  Marland Kitchen amLODipine (NORVASC) 10 MG tablet Take 1 tablet (10 mg total) by mouth daily.  Marland Kitchen buPROPion (WELLBUTRIN XL) 150 MG 24 hr tablet Take 1 tablet (150 mg total) by mouth daily.  . cholecalciferol (VITAMIN D) 1000 units tablet Take 2,000 Units by mouth every Tuesday.   . clotrimazole-betamethasone (LOTRISONE) cream APPLY TO AFFECTED AREAS TWICE A DAY  . ezetimibe (ZETIA) 10 MG tablet Take 1 tablet (10 mg total) by mouth daily.  Marland Kitchen lamoTRIgine (LAMICTAL) 200 MG tablet Take 1 tablet (200 mg total) by mouth daily.  Marland Kitchen levothyroxine (SYNTHROID) 50 MCG tablet TAKE 1 TABLET DAILY MON-SAT AND 1&1/2 TABLETS ON SUN  . montelukast (SINGULAIR) 10 MG tablet Take 1 tablet (10 mg total) by mouth at bedtime.  Marland Kitchen OLANZapine (ZYPREXA) 5 MG tablet Take 1 tablet (5 mg total) by mouth at bedtime.  Marland Kitchen omeprazole (PRILOSEC) 20 MG capsule Take 1 capsule (20 mg total) by mouth daily.  . rosuvastatin (CRESTOR) 40 MG tablet Take 1 tablet (40 mg total) by mouth daily.  . SYMBICORT 160-4.5 MCG/ACT inhaler 2 PUFFS 2 TIMES A DAY  . traZODone (DESYREL) 100 MG tablet Take 1 tablet (100 mg total) by mouth at bedtime as needed for  sleep.  Marland Kitchen triamterene-hydrochlorothiazide (MAXZIDE-25) 37.5-25 MG tablet TAKE 1 TABLET ONCE DAILY  . VOLTAREN 1 % GEL APPLY TO THE AFFECTED AREA(S) TWICE A DAY AS NEEDED FOR PAIN   No facility-administered encounter medications on file as of 08/02/2019.    Activities of Daily Living In your present state of health, do you have any difficulty performing the following activities: 08/02/2019 08/02/2019  Hearing? N N  Vision? N N  Difficulty concentrating or making decisions? N N  Walking or climbing stairs? N N  Dressing or bathing? N N  Doing errands, shopping? N N  Preparing Food and eating ? N -  Using the Toilet? N -  In the past six months, have you accidently leaked urine? N -  Do you have problems with loss of bowel control? N -  Managing your Medications? N -  Managing your Finances? N -  Housekeeping or managing your Housekeeping? N -  Some recent data might be hidden    Patient Care Team: Fayrene Helper, MD as PCP - General Cloria Spring, MD as Consulting Physician [facility])    Assessment:   This is a routine wellness examination for Lukisha.  Exercise Activities and Dietary recommendations Current Exercise Habits: Structured exercise class, Type of exercise: strength training/weights, Time (Minutes): 60, Frequency (Times/Week): 2, Weekly Exercise (Minutes/Week): 120, Intensity: Moderate  Goals    . decrease carbohydrates     Recommend decreasing simple carbohydrates.    Marland Kitchen LIFESTYLE - DECREASE FALLS RISK       Fall Risk Fall Risk  08/02/2019 12/09/2018 07/28/2018 07/27/2018 02/23/2018  Falls in the past year? 0 0 0 0 0  Number falls in past yr: 0 0 - - 0  Injury with Fall? 0 0 0 0 0  Follow up - - - - -  Comment - - - - -   Is the patient's home free of loose throw rugs in walkways, pet beds, electrical cords, etc?   yes      Grab bars in the bathroom? yes      Handrails on the stairs?   yes      Adequate lighting?   yes  Timed Get Up and Go  performed: not performed phone visit   Depression Screen PHQ 2/9 Scores 08/02/2019 12/09/2018 07/28/2018 07/27/2018  PHQ - 2 Score 0 0 0 0  PHQ- 9 Score 0 - - -     Cognitive Function     6CIT Screen 08/02/2019 07/28/2018 07/23/2017 05/21/2016  What Year? 0 points 0 points 0 points 0 points  What month? 0 points 0 points 0 points 0 points  What time? 0 points 0 points 0 points 0 points  Count back from 20 0 points 0 points 0 points 0 points  Months in reverse 0 points 0 points 0 points 0 points  Repeat phrase 2 points 2 points 0 points 0 points  Total Score 2 2 0 0    Immunization History  Administered Date(s) Administered  . Influenza Split 01/08/2012, 12/08/2013, 01/06/2015  . Influenza Whole 12/29/2008, 11/28/2010  . Influenza,inj,Quad PF,6+ Mos 01/15/2013, 02/20/2016, 02/19/2017, 02/23/2018, 12/09/2018  . Moderna SARS-COVID-2 Vaccination 06/05/2019, 07/21/2019  . Pneumococcal Conjugate-13 10/26/2013  . Tdap 03/26/2010, 09/17/2016  . Zoster Recombinat (Shingrix) 11/05/2016    Qualifies for Shingles Vaccine? qualifies  Screening Tests Health Maintenance  Topic Date Due  . INFLUENZA VACCINE  10/24/2019  . MAMMOGRAM  11/05/2020  . PAP SMEAR-Modifier  02/23/2021  . COLONOSCOPY  06/27/2021  . TETANUS/TDAP  09/18/2026  . COVID-19 Vaccine  Completed  . Hepatitis C Screening  Completed  . HIV Screening  Completed    Cancer Screenings: Lung: Low Dose CT Chest recommended if Age 73-80 years, 30 pack-year currently smoking OR have quit w/in 15years. Patient does not qualify. Breast:  Up to date on Mammogram? Yes   Up to date of Bone Density/Dexa? Yes n/a Colorectal: up to date  Additional Screenings:  Hepatitis C Screening: negative     Plan:     I have personally reviewed and noted the following in the patient's chart:   . Medical and social history . Use of alcohol, tobacco or illicit drugs  . Current medications and supplements . Functional ability and status .  Nutritional status . Physical activity . Advanced directives . List of other physicians . Hospitalizations, surgeries, and ER visits in previous 12 months . Vitals . Screenings to include cognitive, depression, and falls . Referrals and appointments  In addition, I have reviewed and discussed with patient certain preventive protocols, quality metrics, and best practice recommendations. A written personalized care plan for preventive services as well as general preventive health recommendations were provided to patient.     Kate Sable, LPN, LPN  [id number]

## 2019-08-02 NOTE — Patient Instructions (Signed)
Rebecca Rodgers , Thank you for taking time to come for your Medicare Wellness Visit. I appreciate your ongoing commitment to your health goals. Please review the following plan we discussed and let me know if I can assist you in the future.   Screening recommendations/referrals: Colonoscopy: up to date Mammogram: up to date- will schedule next appt in august  Bone Density: n/a Recommended yearly ophthalmology/optometry visit for glaucoma screening and checkup Recommended yearly dental visit for hygiene and checkup  Vaccinations: Influenza vaccine: will start the end of August 2021 Pneumococcal vaccine: up to date  Tdap vaccine: up to date  Shingles vaccine: check with the pharmacy      Preventive Care 40-64 Years, Female Preventive care refers to lifestyle choices and visits with your health care provider that can promote health and wellness. What does preventive care include?  A yearly physical exam. This is also called an annual well check.  Dental exams once or twice a year.  Routine eye exams. Ask your health care provider how often you should have your eyes checked.  Personal lifestyle choices, including:  Daily care of your teeth and gums.  Regular physical activity.  Eating a healthy diet.  Avoiding tobacco and drug use.  Limiting alcohol use.  Practicing safe sex.  Taking low-dose aspirin daily starting at age 8.  Taking vitamin and mineral supplements as recommended by your health care provider. What happens during an annual well check? The services and screenings done by your health care provider during your annual well check will depend on your age, overall health, lifestyle risk factors, and family history of disease. Counseling  Your health care provider may ask you questions about your:  Alcohol use.  Tobacco use.  Drug use.  Emotional well-being.  Home and relationship well-being.  Sexual activity.  Eating habits.  Work and work  Statistician.  Method of birth control.  Menstrual cycle.  Pregnancy history. Screening  You may have the following tests or measurements:  Height, weight, and BMI.  Blood pressure.  Lipid and cholesterol levels. These may be checked every 5 years, or more frequently if you are over 71 years old.  Skin check.  Lung cancer screening. You may have this screening every year starting at age 98 if you have a 30-pack-year history of smoking and currently smoke or have quit within the past 15 years.  Fecal occult blood test (FOBT) of the stool. You may have this test every year starting at age 78.  Flexible sigmoidoscopy or colonoscopy. You may have a sigmoidoscopy every 5 years or a colonoscopy every 10 years starting at age 42.  Hepatitis C blood test.  Hepatitis B blood test.  Sexually transmitted disease (STD) testing.  Diabetes screening. This is done by checking your blood sugar (glucose) after you have not eaten for a while (fasting). You may have this done every 1-3 years.  Mammogram. This may be done every 1-2 years. Talk to your health care provider about when you should start having regular mammograms. This may depend on whether you have a family history of breast cancer.  BRCA-related cancer screening. This may be done if you have a family history of breast, ovarian, tubal, or peritoneal cancers.  Pelvic exam and Pap test. This may be done every 3 years starting at age 38. Starting at age 74, this may be done every 5 years if you have a Pap test in combination with an HPV test.  Bone density scan. This is done to  screen for osteoporosis. You may have this scan if you are at high risk for osteoporosis. Discuss your test results, treatment options, and if necessary, the need for more tests with your health care provider. Vaccines  Your health care provider may recommend certain vaccines, such as:  Influenza vaccine. This is recommended every year.  Tetanus, diphtheria,  and acellular pertussis (Tdap, Td) vaccine. You may need a Td booster every 10 years.  Zoster vaccine. You may need this after age 42.  Pneumococcal 13-valent conjugate (PCV13) vaccine. You may need this if you have certain conditions and were not previously vaccinated.  Pneumococcal polysaccharide (PPSV23) vaccine. You may need one or two doses if you smoke cigarettes or if you have certain conditions. Talk to your health care provider about which screenings and vaccines you need and how often you need them. This information is not intended to replace advice given to you by your health care provider. Make sure you discuss any questions you have with your health care provider. Document Released: 04/07/2015 Document Revised: 11/29/2015 Document Reviewed: 01/10/2015 Elsevier Interactive Patient Education  2017 [place] Prevention in the Home Falls can cause injuries. They can happen to people of all ages. There are many things you can do to make your home safe and to help prevent falls. What can I do on the outside of my home?  Regularly fix the edges of walkways and driveways and fix any cracks.  Remove anything that might make you trip as you walk through a door, such as a raised step or threshold.  Trim any bushes or trees on the path to your home.  Use bright outdoor lighting.  Clear any walking paths of anything that might make someone trip, such as rocks or tools.  Regularly check to see if handrails are loose or broken. Make sure that both sides of any steps have handrails.  Any raised decks and porches should have guardrails on the edges.  Have any leaves, snow, or ice cleared regularly.  Use sand or salt on walking paths during winter.  Clean up any spills in your garage right away. This includes oil or grease spills. What can I do in the bathroom?  Use night lights.  Install grab bars by the toilet and in the tub and shower. Do not use towel bars as grab  bars.  Use non-skid mats or decals in the tub or shower.  If you need to sit down in the shower, use a plastic, non-slip stool.  Keep the floor dry. Clean up any water that spills on the floor as soon as it happens.  Remove soap buildup in the tub or shower regularly.  Attach bath mats securely with double-sided non-slip rug tape.  Do not have throw rugs and other things on the floor that can make you trip. What can I do in the bedroom?  Use night lights.  Make sure that you have a light by your bed that is easy to reach.  Do not use any sheets or blankets that are too big for your bed. They should not hang down onto the floor.  Have a firm chair that has side arms. You can use this for support while you get dressed.  Do not have throw rugs and other things on the floor that can make you trip. What can I do in the kitchen?  Clean up any spills right away.  Avoid walking on wet floors.  Keep items that you  use a lot in easy-to-reach places.  If you need to reach something above you, use a strong step stool that has a grab bar.  Keep electrical cords out of the way.  Do not use floor polish or wax that makes floors slippery. If you must use wax, use non-skid floor wax.  Do not have throw rugs and other things on the floor that can make you trip. What can I do with my stairs?  Do not leave any items on the stairs.  Make sure that there are handrails on both sides of the stairs and use them. Fix handrails that are broken or loose. Make sure that handrails are as long as the stairways.  Check any carpeting to make sure that it is firmly attached to the stairs. Fix any carpet that is loose or worn.  Avoid having throw rugs at the top or bottom of the stairs. If you do have throw rugs, attach them to the floor with carpet tape.  Make sure that you have a light switch at the top of the stairs and the bottom of the stairs. If you do not have them, ask someone to add them for  you. What else can I do to help prevent falls?  Wear shoes that:  Do not have high heels.  Have rubber bottoms.  Are comfortable and fit you well.  Are closed at the toe. Do not wear sandals.  If you use a stepladder:  Make sure that it is fully opened. Do not climb a closed stepladder.  Make sure that both sides of the stepladder are locked into place.  Ask someone to hold it for you, if possible.  Clearly mark and make sure that you can see:  Any grab bars or handrails.  First and last steps.  Where the edge of each step is.  Use tools that help you move around (mobility aids) if they are needed. These include:  Canes.  Walkers.  Scooters.  Crutches.  Turn on the lights when you go into a dark area. Replace any light bulbs as soon as they burn out.  Set up your furniture so you have a clear path. Avoid moving your furniture around.  If any of your floors are uneven, fix them.  If there are any pets around you, be aware of where they are.  Review your medicines with your doctor. Some medicines can make you feel dizzy. This can increase your chance of falling. Ask your doctor what other things that you can do to help prevent falls. This information is not intended to replace advice given to you by your health care provider. Make sure you discuss any questions you have with your health care provider. Document Released: 01/05/2009 Document Revised: 08/17/2015 Document Reviewed: 04/15/2014 Elsevier Interactive Patient Education  2017 [employer].

## 2019-08-04 ENCOUNTER — Ambulatory Visit: Payer: Medicare HMO | Admitting: Family Medicine

## 2019-08-19 ENCOUNTER — Other Ambulatory Visit: Payer: Self-pay | Admitting: Physician Assistant

## 2019-08-19 ENCOUNTER — Other Ambulatory Visit: Payer: Self-pay | Admitting: Family Medicine

## 2019-08-19 DIAGNOSIS — F3162 Bipolar disorder, current episode mixed, moderate: Secondary | ICD-10-CM

## 2019-09-13 ENCOUNTER — Other Ambulatory Visit: Payer: Self-pay | Admitting: Physician Assistant

## 2019-09-13 ENCOUNTER — Other Ambulatory Visit: Payer: Self-pay | Admitting: Family Medicine

## 2019-09-13 DIAGNOSIS — F3162 Bipolar disorder, current episode mixed, moderate: Secondary | ICD-10-CM

## 2019-09-28 ENCOUNTER — Other Ambulatory Visit: Payer: Self-pay | Admitting: Family Medicine

## 2019-10-28 ENCOUNTER — Other Ambulatory Visit: Payer: Self-pay | Admitting: Family Medicine

## 2019-11-09 ENCOUNTER — Ambulatory Visit (HOSPITAL_COMMUNITY): Payer: Medicare HMO

## 2019-11-11 ENCOUNTER — Ambulatory Visit (HOSPITAL_COMMUNITY): Payer: Medicare HMO

## 2019-11-15 ENCOUNTER — Other Ambulatory Visit: Payer: Self-pay | Admitting: Family Medicine

## 2019-11-15 ENCOUNTER — Other Ambulatory Visit: Payer: Self-pay | Admitting: Physician Assistant

## 2019-11-15 DIAGNOSIS — F3162 Bipolar disorder, current episode mixed, moderate: Secondary | ICD-10-CM

## 2019-11-17 ENCOUNTER — Telehealth: Payer: Self-pay | Admitting: Physician Assistant

## 2019-11-17 NOTE — Telephone Encounter (Signed)
Pt taking trazadone and zzzquil together at 9pm at night. Pt wants to know if this is something she can continue.

## 2019-11-18 NOTE — Telephone Encounter (Signed)
Left patient detailed message that it is safe to take trazodone and zquil together, obviously more sedating but if she can tolerate it and needs it she can continue. Instructed to call back with further questions.

## 2019-12-15 ENCOUNTER — Ambulatory Visit (INDEPENDENT_AMBULATORY_CARE_PROVIDER_SITE_OTHER): Payer: Medicare HMO | Admitting: Family Medicine

## 2019-12-15 ENCOUNTER — Encounter: Payer: Self-pay | Admitting: Family Medicine

## 2019-12-15 VITALS — BP 129/67 | HR 98 | Ht 63.0 in | Wt 175.0 lb

## 2019-12-15 DIAGNOSIS — E785 Hyperlipidemia, unspecified: Secondary | ICD-10-CM | POA: Diagnosis not present

## 2019-12-15 DIAGNOSIS — I10 Essential (primary) hypertension: Secondary | ICD-10-CM | POA: Diagnosis not present

## 2019-12-15 DIAGNOSIS — Z23 Encounter for immunization: Secondary | ICD-10-CM

## 2019-12-15 DIAGNOSIS — E038 Other specified hypothyroidism: Secondary | ICD-10-CM

## 2019-12-15 DIAGNOSIS — Z0000 Encounter for general adult medical examination without abnormal findings: Secondary | ICD-10-CM | POA: Diagnosis not present

## 2019-12-15 DIAGNOSIS — E559 Vitamin D deficiency, unspecified: Secondary | ICD-10-CM | POA: Diagnosis not present

## 2019-12-15 DIAGNOSIS — R7302 Impaired glucose tolerance (oral): Secondary | ICD-10-CM

## 2019-12-15 NOTE — Progress Notes (Signed)
    Rebecca Rodgers     MRN: [id number]      DOB: 1955-06-21  HPI: Patient is in for annual physical exam. No other health concerns are expressed or addressed at the visit.  Immunization is reviewed , and  updated   PE: BP 129/67   Pulse 98   Ht 5\' 3"  (1.6 m)   Wt 175 lb (79.4 kg)   BMI 31.00 kg/m   Pleasant  female, alert and oriented x 3, in no cardio-pulmonary distress. Afebrile. HEENT No facial trauma or asymetry. Sinuses non tender.  Extra occullar muscles intact.. External ears normal, . Neck: supple, no adenopathy,JVD or thyromegaly.No bruits.  Chest: Clear to ascultation bilaterally.No crackles or wheezes. Non tender to palpation  Breast: No asymetry,no masses or lumps. No tenderness. No nipple discharge or inversion. No axillary or supraclavicular adenopathy  Cardiovascular system; Heart sounds normal,  S1 and  S2 ,no S3.  No murmur, or thrill. Apical beat not displaced Peripheral pulses normal.  Abdomen: Soft, non tender, no organomegaly or masses. No bruits. Bowel sounds normal. No guarding, tenderness or rebound.      Musculoskeletal exam: Full ROM of spine, hips , shoulders and knees. No deformity ,swelling or crepitus noted. No muscle wasting or atrophy.   Neurologic: Cranial nerves 2 to 12 intact. Power, tone ,sensation and reflexes normal throughout. No disturbance in gait. No tremor.  Skin: Intact, no ulceration, erythema , scaling or rash noted. Pigmentation normal throughout  Psych; Normal mood and affect. Judgement and concentration normal   Assessment & Plan:  Annual physical exam Annual exam as documented. Counseling done  re healthy lifestyle involving commitment to 150 minutes exercise per week, heart healthy diet, and attaining healthy weight.The importance of adequate sleep also discussed. Regular seat belt use and home safety, is also discussed. Changes in health habits are decided on by the patient with goals and time  frames  set for achieving them. Immunization and cancer screening needs are specifically addressed at this visit.   Need for immunization against influenza After obtaining informed consent, the vaccine is  administered , with no adverse effect noted at the time of administration.

## 2019-12-15 NOTE — Assessment & Plan Note (Signed)

## 2019-12-15 NOTE — Patient Instructions (Signed)
Annual physical exam in 12 months, call if you need me sooner  Flu  vaccine today  Labs today CBC, lipid, cmp and eGFR, TSH, vit D  It is important that you exercise regularly at least 30 minutes 5 times a week. If you develop chest pain, have severe difficulty breathing, or feel very tired, stop exercising immediately and seek medical attention   Think about what you will eat, plan ahead. Choose " clean, green, fresh or frozen" over canned, processed or packaged foods which are more sugary, salty and fatty. 70 to 75% of food eaten should be vegetables and fruit. Three meals at set times with snacks allowed between meals, but they must be fruit or vegetables. Aim to eat over a 12 hour period , example 7 am to 7 pm, and STOP after  your last meal of the day. Drink water,generally about 64 ounces per day, no other drink is as healthy. Fruit juice is best enjoyed in a healthy way, by EATING the fruit.  Thanks for choosing [facility], we consider it a privelige to serve you.

## 2019-12-15 NOTE — Assessment & Plan Note (Signed)
After obtaining informed consent, the vaccine is  administered , with no adverse effect noted at the time of administration.  

## 2019-12-16 ENCOUNTER — Other Ambulatory Visit: Payer: Self-pay | Admitting: Family Medicine

## 2019-12-16 LAB — CMP14+EGFR
ALT: 21 IU/L (ref 0–32)
AST: 24 IU/L (ref 0–40)
Albumin/Globulin Ratio: 1.4 (ref 1.2–2.2)
Albumin: 4.6 g/dL (ref 3.8–4.8)
Alkaline Phosphatase: 77 IU/L (ref 44–121)
BUN/Creatinine Ratio: 14 (ref 12–28)
BUN: 16 mg/dL (ref 8–27)
Bilirubin Total: 0.3 mg/dL (ref 0.0–1.2)
CO2: 22 mmol/L (ref 20–29)
Calcium: 9.9 mg/dL (ref 8.7–10.3)
Chloride: 97 mmol/L (ref 96–106)
Creatinine, Ser: 1.13 mg/dL — ABNORMAL HIGH (ref 0.57–1.00)
GFR calc Af Amer: 60 mL/min/{1.73_m2} (ref >59)
GFR calc non Af Amer: 52 mL/min/{1.73_m2} — ABNORMAL LOW (ref >59)
Globulin, Total: 3.4 g/dL (ref 1.5–4.5)
Glucose: 100 mg/dL — ABNORMAL HIGH (ref 65–99)
Potassium: 4.1 mmol/L (ref 3.5–5.2)
Sodium: 137 mmol/L (ref 134–144)
Total Protein: 8 g/dL (ref 6.0–8.5)

## 2019-12-16 LAB — VITAMIN D 25 HYDROXY (VIT D DEFICIENCY, FRACTURES): Vit D, 25-Hydroxy: 44.1 ng/mL (ref 30.0–100.0)

## 2019-12-16 LAB — LIPID PANEL
Chol/HDL Ratio: 2.4 ratio (ref 0.0–4.4)
Cholesterol, Total: 185 mg/dL (ref 100–199)
HDL: 78 mg/dL (ref >39)
LDL Chol Calc (NIH): 92 mg/dL (ref 0–99)
Triglycerides: 82 mg/dL (ref 0–149)
VLDL Cholesterol Cal: 15 mg/dL (ref 5–40)

## 2019-12-16 LAB — CBC
Hematocrit: 41.4 % (ref 34.0–46.6)
Hemoglobin: 13.7 g/dL (ref 11.1–15.9)
MCH: 30.2 pg (ref 26.6–33.0)
MCHC: 33.1 g/dL (ref 31.5–35.7)
MCV: 91 fL (ref 79–97)
Platelets: 378 10*3/uL (ref 150–450)
RBC: 4.54 x10E6/uL (ref 3.77–5.28)
RDW: 13.9 % (ref 11.7–15.4)
WBC: 8.3 10*3/uL (ref 3.4–10.8)

## 2019-12-16 LAB — TSH: TSH: 1.23 u[IU]/mL (ref 0.450–4.500)

## 2019-12-20 ENCOUNTER — Other Ambulatory Visit: Payer: Self-pay

## 2019-12-20 ENCOUNTER — Ambulatory Visit (HOSPITAL_COMMUNITY)
Admission: RE | Admit: 2019-12-20 | Discharge: 2019-12-20 | Disposition: A | Discharge status: Home / Self Care | Payer: Medicare HMO | Source: Ambulatory Visit | Attending: Family Medicine | Admitting: Family Medicine

## 2019-12-20 DIAGNOSIS — Z1231 Encounter for screening mammogram for malignant neoplasm of breast: Secondary | ICD-10-CM | POA: Diagnosis not present

## 2019-12-28 IMAGING — MG DIGITAL SCREENING BILATERAL MAMMOGRAM WITH TOMO AND CAD
6 of 10 series · 6 of 30 positions shown · non-contrast
Comparison: Previous exam(s).

CLINICAL DATA: Screening.

EXAM:
DIGITAL SCREENING BILATERAL MAMMOGRAM WITH TOMO AND CAD

[R MLO synth-2D]
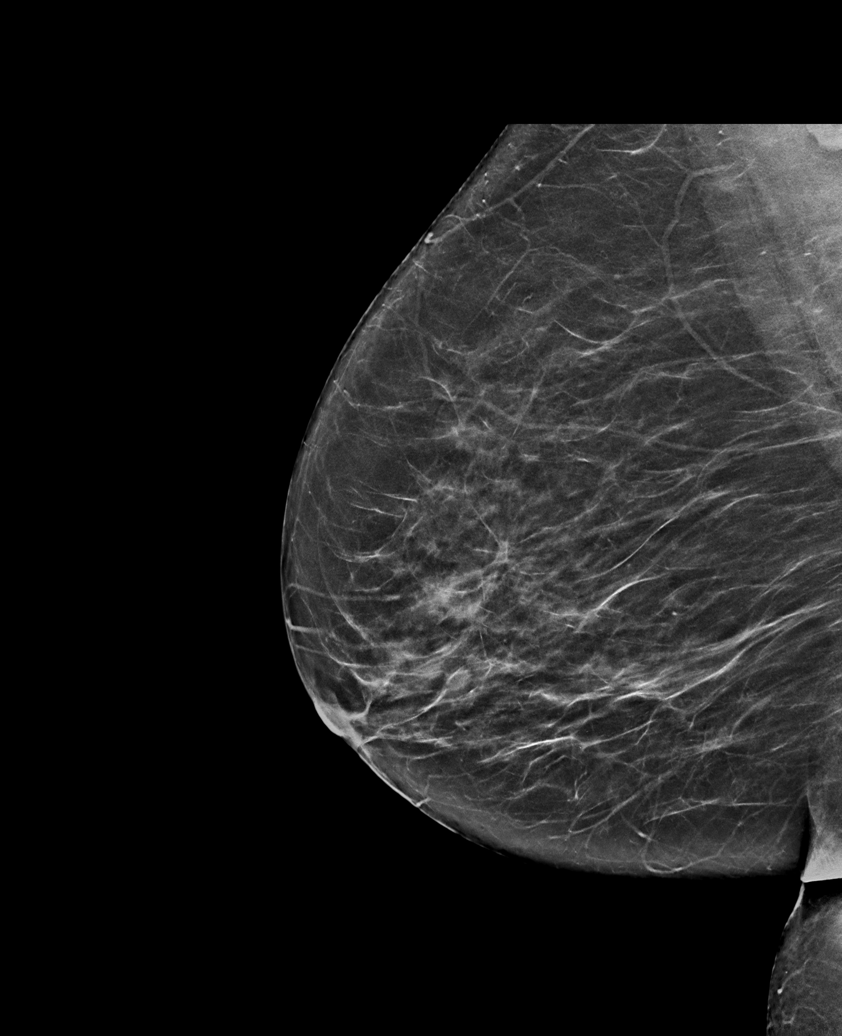

[R CC synth-2D]
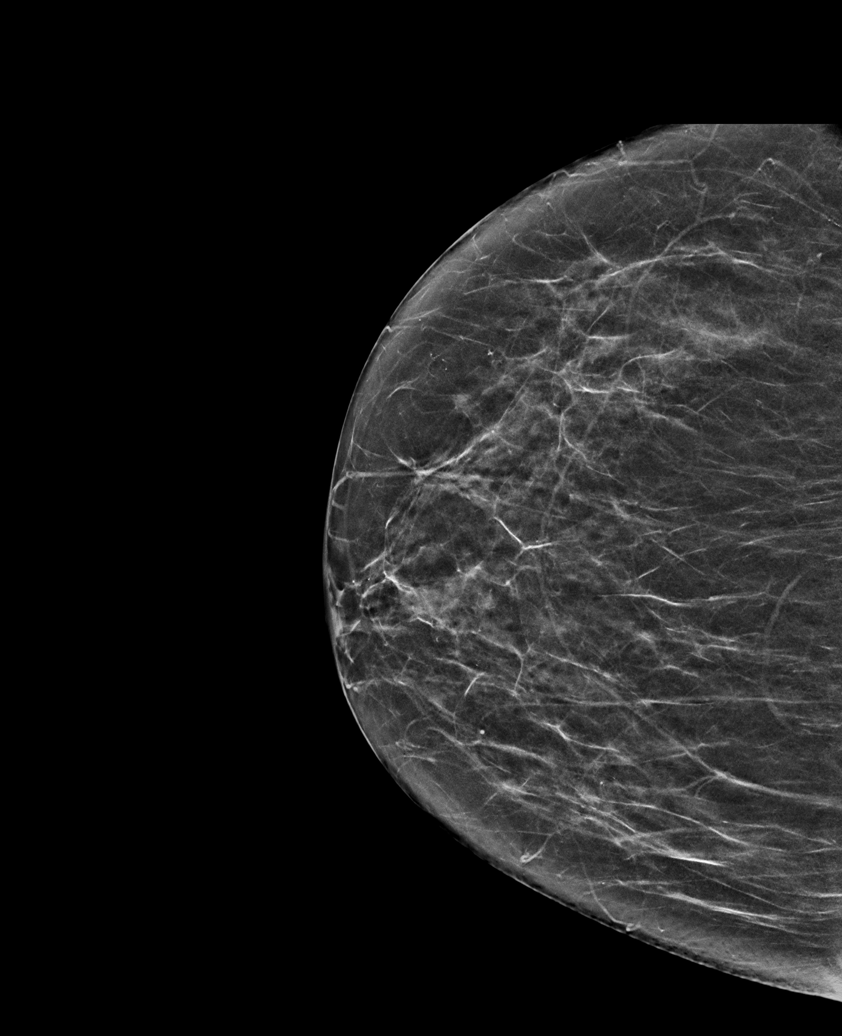

[L MLO synth-2D (1 of 2)]
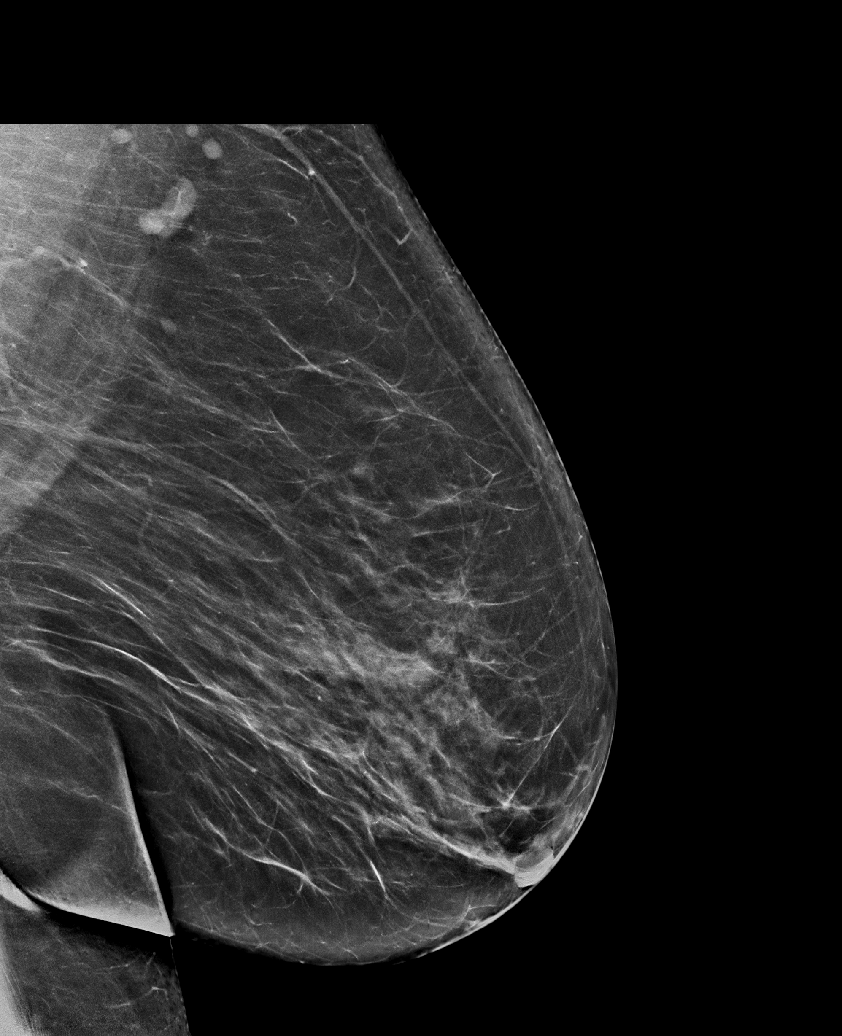

[L CC synth-2D]
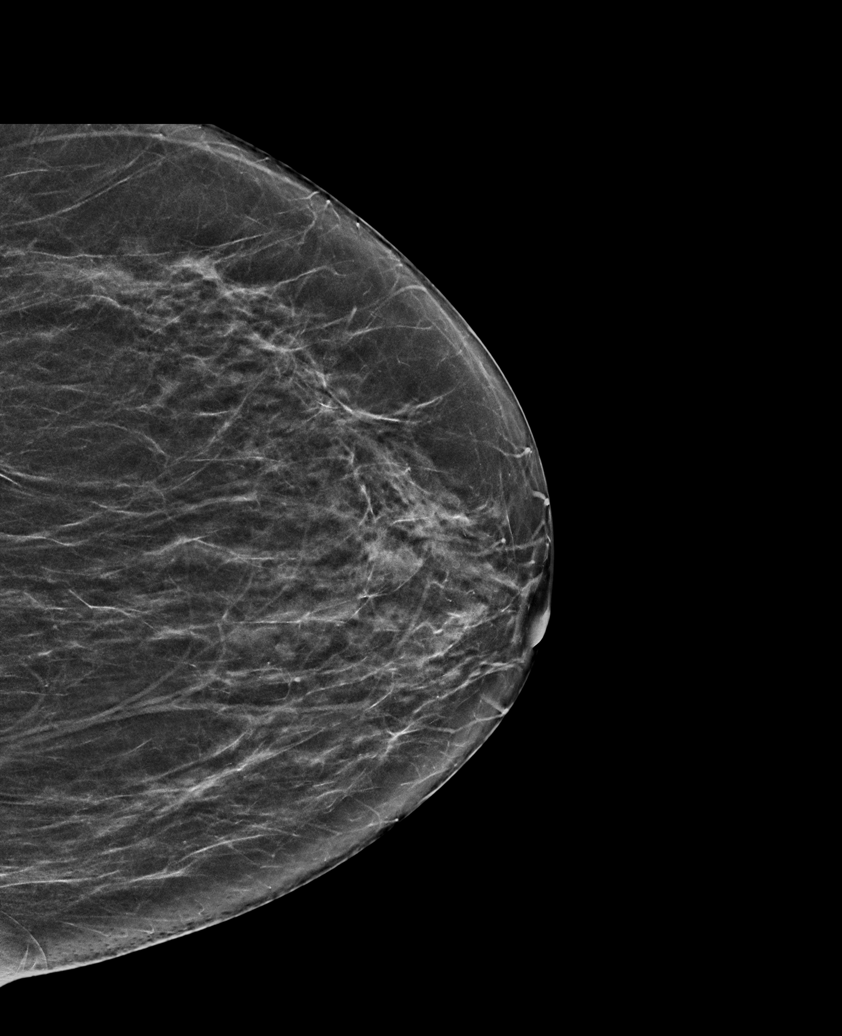

[L MLO synth-2D (2 of 2)]
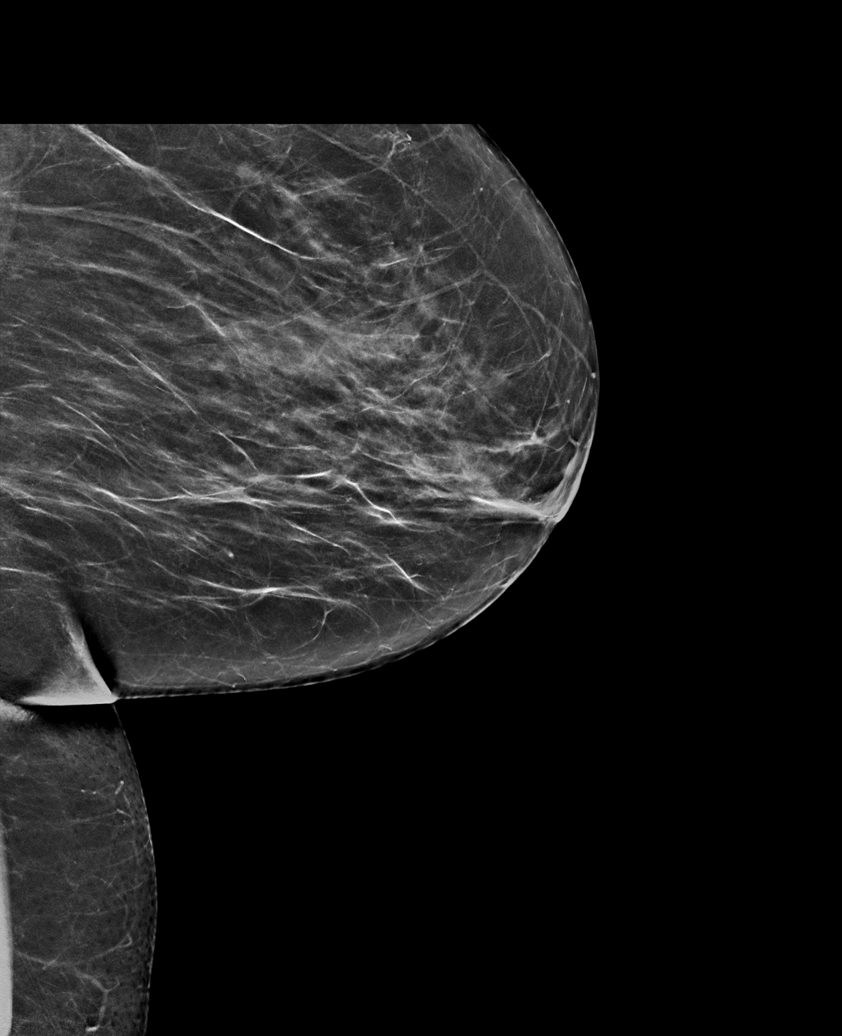

[R MLO tomo · tomo slice 33/66.0]
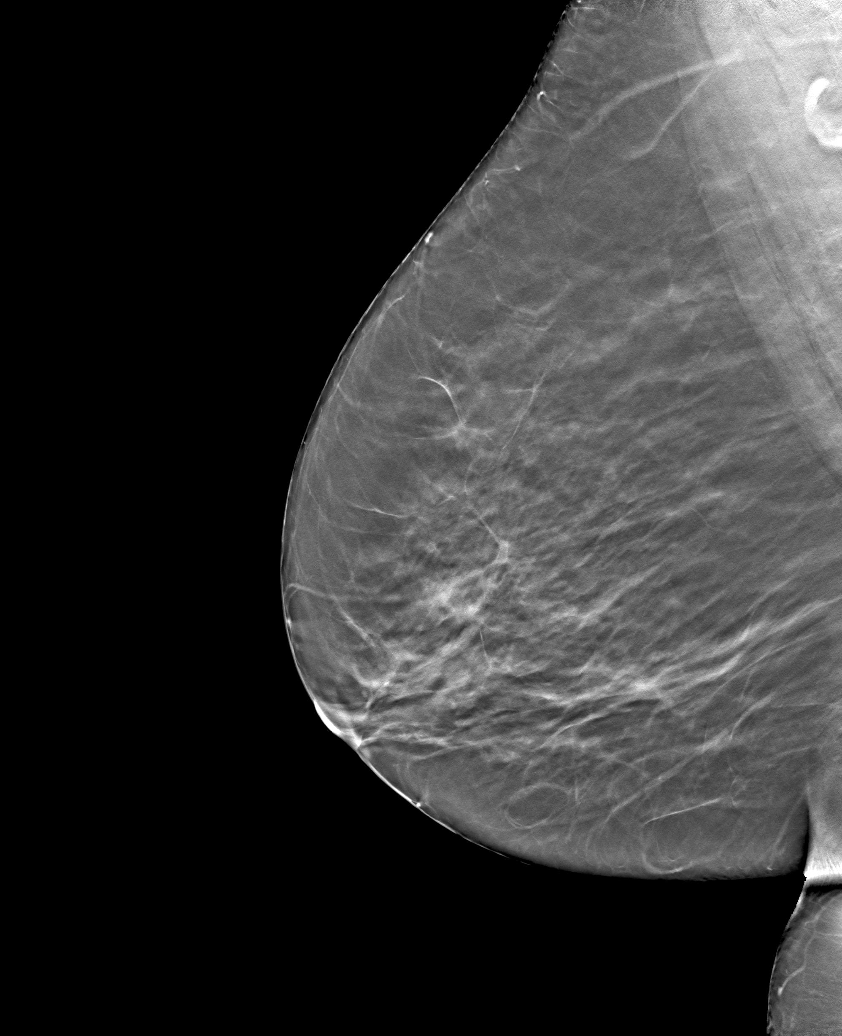

[6 of 30 positions shown; findings below may reference images not displayed]

ACR Breast Density Category b: There are scattered areas of
fibroglandular density.
FINDINGS: There are no findings suspicious for malignancy. Images were
processed with CAD.
IMPRESSION: No mammographic evidence of malignancy. A result letter of this
screening mammogram will be mailed directly to the patient.

RECOMMENDATION:
Screening mammogram in one year. (Code:[id number])

BI-RADS CATEGORY  1: Negative.

## 2020-01-04 ENCOUNTER — Other Ambulatory Visit: Payer: Self-pay | Admitting: Family Medicine

## 2020-01-13 ENCOUNTER — Other Ambulatory Visit: Payer: Self-pay

## 2020-01-13 ENCOUNTER — Encounter: Payer: Self-pay | Admitting: Physician Assistant

## 2020-01-13 ENCOUNTER — Ambulatory Visit (INDEPENDENT_AMBULATORY_CARE_PROVIDER_SITE_OTHER): Payer: Medicare HMO | Admitting: Physician Assistant

## 2020-01-13 DIAGNOSIS — G4700 Insomnia, unspecified: Secondary | ICD-10-CM

## 2020-01-13 DIAGNOSIS — F319 Bipolar disorder, unspecified: Secondary | ICD-10-CM

## 2020-01-13 NOTE — Progress Notes (Signed)
[facility]  Patient ID: Rebecca Rodgers,  MRN: [id number]  PCP: Fayrene Helper, MD  Date of Evaluation: 01/13/2020 Time spent:30 minutes  Chief Complaint:  Chief Complaint    Anxiety; Depression      HISTORY/CURRENT STATUS: HPI For routine med check.  Doing really well. She and her extended family of 31 or so, just got back from [place] for 12 days.  Had a great time.  Feels good. Is able to enjoy things. Energy and motivation are good. Is going to look for a job, just so she will be able to socialize.  Is on disability and cannot work a lot.  She does not cry easily.  Appetite is good.  No weight gain or weight loss.  Not having a lot of anxiety.  Sleeps well most of the time.  No suicidal or homicidal thoughts.  Patient denies increased energy with decreased need for sleep, no increased talkativeness, no racing thoughts, no impulsivity or risky behaviors, no increased spending, no increased libido, no grandiosity, no increased irritability or anger, and no hallucinations.  Denies dizziness, syncope, seizures, numbness, tingling, tremor, tics, unsteady gait, slurred speech, confusion. Denies muscle or joint pain, stiffness, or dystonia. Denies unexplained weight loss, frequent infections, or sores that heal slowly.  No polyphagia, polydipsia, or polyuria. Denies visual changes or paresthesias.   Individual Medical History/ Review of Systems: Changes? :Yes  New hearing aides. Bilateral.   Past medications for mental health diagnoses include: Lamictal, Trazodone, Zyprexa, Wellbutrin, others she can't remember.   Allergies: Codeine and Penicillins  Current Medications:  Current Outpatient Medications:  .  albuterol (PROVENTIL HFA;VENTOLIN HFA) 108 (90 Base) MCG/ACT inhaler, Inhale 2 puffs into the lungs every 6 (six) hours as needed for wheezing or shortness of breath., Disp: 1 Inhaler, Rfl: 0 .  amLODipine (NORVASC) 10 MG tablet, TAKE 1 TABLET ONCE A DAY,  Disp: 90 tablet, Rfl: 0 .  buPROPion (WELLBUTRIN XL) 150 MG 24 hr tablet, TAKE 1 TABLET ONCE A DAY, Disp: 90 tablet, Rfl: 1 .  cholecalciferol (VITAMIN D) 1000 units tablet, Take 2,000 Units by mouth every Tuesday. , Disp: , Rfl:  .  clotrimazole-betamethasone (LOTRISONE) cream, APPLY TO AFFECTED AREAS TWICE A DAY, Disp: 45 g, Rfl: 0 .  ezetimibe (ZETIA) 10 MG tablet, Take 1 tablet (10 mg total) by mouth daily., Disp: 90 tablet, Rfl: 0 .  lamoTRIgine (LAMICTAL) 200 MG tablet, TAKE 1 TABLET DAILY, Disp: 90 tablet, Rfl: 1 .  levothyroxine (SYNTHROID) 50 MCG tablet, TAKE 1 TABLET DAILY MON-SAT AND 1&1/2 TABLETS ON SUN, Disp: 100 tablet, Rfl: 0 .  montelukast (SINGULAIR) 10 MG tablet, TAKE 1 TABLET AT BEDTIME, Disp: 90 tablet, Rfl: 0 .  OLANZapine (ZYPREXA) 5 MG tablet, TAKE 1 TABLET AT BEDTIME, Disp: 90 tablet, Rfl: 1 .  omeprazole (PRILOSEC) 20 MG capsule, TAKE (1) CAPSULE DAILY, Disp: 60 capsule, Rfl: 0 .  rosuvastatin (CRESTOR) 40 MG tablet, TAKE 1 TABLET ONCE DAILY, Disp: 90 tablet, Rfl: 0 .  SYMBICORT 160-4.5 MCG/ACT inhaler, 2 PUFFS 2 TIMES A DAY, Disp: 30.6 g, Rfl: 0 .  traZODone (DESYREL) 100 MG tablet, TAKE 1 TABLET AT BEDTIME FOR SLEEP, Disp: 90 tablet, Rfl: 0 .  triamterene-hydrochlorothiazide (MAXZIDE-25) 37.5-25 MG tablet, TAKE 1 TABLET ONCE DAILY, Disp: 90 tablet, Rfl: 0 .  VOLTAREN 1 % GEL, APPLY TO THE AFFECTED AREA(S) TWICE A DAY AS NEEDED FOR PAIN, Disp: 100 g, Rfl: 0 .  acetaminophen (TYLENOL) 500 MG tablet, Take 500 mg  by mouth every 6 (six) hours as needed (pain). (Patient not taking: Reported on 01/13/2020), Disp: , Rfl:  Medication Side Effects: none  Family Medical/ Social History: Changes? No  MENTAL HEALTH EXAM:  There were no vitals taken for this visit.There is no height or weight on file to calculate BMI.  General Appearance: Casual, Neat and Well Groomed  Eye Contact:  Good  Speech:  Clear and Coherent and Normal Rate  Volume:  Normal  Mood:  Euthymic  Affect:   Appropriate  Thought Process:  Goal Directed and Descriptions of Associations: Intact  Orientation:  Full (Time, Place, and Person)  Thought Content: Logical   Suicidal Thoughts:  No  Homicidal Thoughts:  No  Memory:  WNL  Judgement:  Good  Insight:  Good  Psychomotor Activity:  Normal  Concentration:  Concentration: Good  Recall:  Good  Fund of Knowledge: Good  Language: Good  Assets:  Desire for Improvement  ADL's:  Intact  Cognition: WNL  Prognosis:  Good   Most recent labs: 12/15/2019 CBC nl CMP Glu 100, BUN 16, Cr 1.13, all normal otherwise Lipids Chol 185, Triglycerides 92, HDL 78, LDL 92. TSH 1.23 Vitamin D 44.1  DIAGNOSES:    ICD-10-CM   1. Bipolar 1 disorder (HCC)  F31.9   2. Insomnia, unspecified type  G47.00     Receiving Psychotherapy: No    RECOMMENDATIONS:  PDMD reviewed. I provided 30 minutes of face-to-face time during this encounter, including lab review which we discussed. I am glad to see her doing so well! Continue Wellbutrin XL 150 mg, 1 p.o. daily. Continue Lamictal 200 mg, 1 p.o. daily. Continue Zyprexa 5 mg, 1 p.o. nightly. Continue trazodone 100 mg, 1 p.o. nightly as needed sleep. Return in 6 months.  Donnal Moat, PA-C

## 2020-01-14 ENCOUNTER — Ambulatory Visit: Payer: Medicare HMO | Admitting: Physician Assistant

## 2020-01-17 ENCOUNTER — Other Ambulatory Visit: Payer: Self-pay

## 2020-01-17 MED ORDER — CLOTRIMAZOLE-BETAMETHASONE 1-0.05 % EX CREA: TOPICAL_CREAM | CUTANEOUS | 0 refills | Status: DC

## 2020-01-18 ENCOUNTER — Other Ambulatory Visit: Payer: Self-pay | Admitting: Family Medicine

## 2020-02-14 ENCOUNTER — Other Ambulatory Visit: Payer: Self-pay | Admitting: Family Medicine

## 2020-02-14 ENCOUNTER — Other Ambulatory Visit: Payer: Self-pay | Admitting: Physician Assistant

## 2020-02-14 DIAGNOSIS — F3162 Bipolar disorder, current episode mixed, moderate: Secondary | ICD-10-CM

## 2020-02-24 ENCOUNTER — Other Ambulatory Visit: Payer: Self-pay | Admitting: Family Medicine

## 2020-03-06 ENCOUNTER — Other Ambulatory Visit: Payer: Self-pay | Admitting: *Deleted

## 2020-03-06 MED ORDER — BUDESONIDE-FORMOTEROL FUMARATE 160-4.5 MCG/ACT IN AERO: INHALATION_SPRAY | RESPIRATORY_TRACT | 0 refills | Status: DC

## 2020-03-06 MED ORDER — AMLODIPINE BESYLATE 10 MG PO TABS
10.0000 mg | ORAL_TABLET | Freq: Every day | ORAL | 0 refills | Status: DC
Start: 2020-03-06 — End: 2020-03-08

## 2020-03-06 MED ORDER — TRIAMTERENE-HCTZ 37.5-25 MG PO TABS: 1.0000 | ORAL_TABLET | Freq: Every day | ORAL | 0 refills | Status: DC

## 2020-03-06 MED ORDER — MONTELUKAST SODIUM 10 MG PO TABS: 10.0000 mg | ORAL_TABLET | Freq: Every day | ORAL | 0 refills | Status: DC

## 2020-03-06 MED ORDER — ROSUVASTATIN CALCIUM 40 MG PO TABS: 40.0000 mg | ORAL_TABLET | Freq: Every day | ORAL | 0 refills | Status: DC

## 2020-03-06 MED ORDER — OMEPRAZOLE 20 MG PO CPDR: DELAYED_RELEASE_CAPSULE | ORAL | 0 refills | Status: DC

## 2020-03-06 MED ORDER — EZETIMIBE 10 MG PO TABS: ORAL_TABLET | ORAL | 0 refills | Status: DC

## 2020-03-07 ENCOUNTER — Other Ambulatory Visit: Payer: Self-pay | Admitting: Physician Assistant

## 2020-03-07 DIAGNOSIS — F3162 Bipolar disorder, current episode mixed, moderate: Secondary | ICD-10-CM

## 2020-03-07 MED ORDER — TRAZODONE HCL 100 MG PO TABS: ORAL_TABLET | ORAL | 1 refills | Status: DC

## 2020-03-07 MED ORDER — OLANZAPINE 5 MG PO TABS: 5.0000 mg | ORAL_TABLET | Freq: Every day | ORAL | 1 refills | Status: DC

## 2020-03-07 MED ORDER — BUPROPION HCL ER (XL) 150 MG PO TB24: 150.0000 mg | ORAL_TABLET | Freq: Every day | ORAL | 1 refills | Status: DC

## 2020-03-07 MED ORDER — LAMOTRIGINE 200 MG PO TABS: 200.0000 mg | ORAL_TABLET | Freq: Every day | ORAL | 1 refills | Status: DC

## 2020-03-08 ENCOUNTER — Other Ambulatory Visit: Payer: Self-pay

## 2020-03-08 MED ORDER — ROSUVASTATIN CALCIUM 40 MG PO TABS: 40.0000 mg | ORAL_TABLET | Freq: Every day | ORAL | 0 refills | Status: DC

## 2020-03-08 MED ORDER — TRIAMTERENE-HCTZ 37.5-25 MG PO TABS: 1.0000 | ORAL_TABLET | Freq: Every day | ORAL | 0 refills | Status: DC

## 2020-03-08 MED ORDER — OMEPRAZOLE 20 MG PO CPDR: DELAYED_RELEASE_CAPSULE | ORAL | 0 refills | Status: DC

## 2020-03-08 MED ORDER — BUDESONIDE-FORMOTEROL FUMARATE 160-4.5 MCG/ACT IN AERO: INHALATION_SPRAY | RESPIRATORY_TRACT | 0 refills | Status: DC

## 2020-03-08 MED ORDER — EZETIMIBE 10 MG PO TABS: ORAL_TABLET | ORAL | 0 refills | Status: DC

## 2020-03-08 MED ORDER — LEVOTHYROXINE SODIUM 50 MCG PO TABS: ORAL_TABLET | ORAL | 0 refills | Status: DC

## 2020-03-08 MED ORDER — MONTELUKAST SODIUM 10 MG PO TABS: 10.0000 mg | ORAL_TABLET | Freq: Every day | ORAL | 0 refills | Status: DC

## 2020-03-08 MED ORDER — AMLODIPINE BESYLATE 10 MG PO TABS: 10.0000 mg | ORAL_TABLET | Freq: Every day | ORAL | 0 refills | Status: DC

## 2020-04-24 ENCOUNTER — Other Ambulatory Visit: Payer: Self-pay | Admitting: Physician Assistant

## 2020-04-24 DIAGNOSIS — F3162 Bipolar disorder, current episode mixed, moderate: Secondary | ICD-10-CM

## 2020-05-20 ENCOUNTER — Other Ambulatory Visit: Payer: Self-pay | Admitting: Family Medicine

## 2020-05-24 ENCOUNTER — Other Ambulatory Visit: Payer: Self-pay | Admitting: Family Medicine

## 2020-06-07 ENCOUNTER — Other Ambulatory Visit: Payer: Self-pay | Admitting: Family Medicine

## 2020-07-07 DIAGNOSIS — M1000 Idiopathic gout, unspecified site: Secondary | ICD-10-CM | POA: Diagnosis not present

## 2020-07-13 ENCOUNTER — Telehealth (INDEPENDENT_AMBULATORY_CARE_PROVIDER_SITE_OTHER): Payer: Medicare HMO | Admitting: Physician Assistant

## 2020-07-13 ENCOUNTER — Encounter: Payer: Self-pay | Admitting: Physician Assistant

## 2020-07-13 DIAGNOSIS — G4700 Insomnia, unspecified: Secondary | ICD-10-CM

## 2020-07-13 DIAGNOSIS — F3162 Bipolar disorder, current episode mixed, moderate: Secondary | ICD-10-CM

## 2020-07-13 DIAGNOSIS — R4189 Other symptoms and signs involving cognitive functions and awareness: Secondary | ICD-10-CM | POA: Diagnosis not present

## 2020-07-13 MED ORDER — TRAZODONE HCL 100 MG PO TABS: 100.0000 mg | ORAL_TABLET | Freq: Every evening | ORAL | 1 refills | Status: DC | PRN

## 2020-07-13 NOTE — Progress Notes (Signed)
[facility]  Patient ID: Rebecca Rodgers,  MRN: [id number]  PCP: Fayrene Helper, MD  Date of Evaluation: 07/13/2020 Time spent:30 minutes  Chief Complaint:  Chief Complaint    Anxiety; Depression; Insomnia     Virtual Visit via Telehealth  I connected with patient by telephone, with their informed consent, and verified patient privacy and that I am speaking with the correct person using two identifiers.  I am private, in my office and the patient is at home.  I discussed the limitations, risks, security and privacy concerns of performing an evaluation and management service by telephone and the availability of in person appointments. I also discussed with the patient that there may be a patient responsible charge related to this service. The patient expressed understanding and agreed to proceed.   I discussed the assessment and treatment plan with the patient. The patient was provided an opportunity to ask questions and all were answered. The patient agreed with the plan and demonstrated an understanding of the instructions.   The patient was advised to call back or seek an in-person evaluation if the symptoms worsen or if the condition fails to improve as anticipated.  I provided 30 minutes of non-face-to-face time during this encounter.   HISTORY/CURRENT STATUS: HPI For 6 mo med check.  Unable to come to appt d/t gout. Having a hard time walking. Feels that current psych meds are working well. If it wasn't for Gout, she'd be feeling well physically also.   Is able to enjoy things, has been to [facility] several times recently.  Denies decreased energy or motivation.  Appetite has not changed.  No extreme sadness, tearfulness, or feelings of hopelessness.  Denies any changes in concentration, making decisions or remembering things.  Denies suicidal or homicidal thoughts.  Patient denies increased energy with decreased need for sleep, no increased talkativeness, no racing  thoughts, no impulsivity or risky behaviors, no increased spending, no increased libido, no grandiosity, no increased irritability or anger, and no hallucinations.  Only problem is having a lot of weird dreams 'for a very long time'  not nightmares. Can be disturbing though, like seeing her dog fall out of a window. Has been meaning to mention this to me but forgets everytime she comes in.   Denies dizziness, syncope, seizures, numbness, tingling, tremor, tics, unsteady gait, slurred speech, confusion.   Individual Medical History/ Review of Systems: Changes? :Yes  gout, acute flare  Past medications for mental health diagnoses include: Lamictal, Trazodone, Zyprexa, Wellbutrin, others she can't remember.   Allergies: Codeine and Penicillins  Current Medications:  Current Outpatient Medications:  .  amLODipine (NORVASC) 10 MG tablet, TAKE 1 TABLET EVERY DAY, Disp: 90 tablet, Rfl: 1 .  budesonide-formoterol (SYMBICORT) 160-4.5 MCG/ACT inhaler, INHALE 2 PUFFS TWICE DAILY, Disp: 3 each, Rfl: 0 .  buPROPion (WELLBUTRIN XL) 150 MG 24 hr tablet, TAKE 1 TABLET ONCE A DAY, Disp: 90 tablet, Rfl: 0 .  cholecalciferol (VITAMIN D) 1000 units tablet, Take 2,000 Units by mouth every Tuesday. , Disp: , Rfl:  .  clotrimazole-betamethasone (LOTRISONE) cream, APPLY TO AFFECTED AREAS TWICE A DAY, Disp: 45 g, Rfl: 0 .  ezetimibe (ZETIA) 10 MG tablet, TAKE 1 TABLET EVERY DAY, Disp: 90 tablet, Rfl: 0 .  lamoTRIgine (LAMICTAL) 200 MG tablet, TAKE 1 TABLET DAILY, Disp: 90 tablet, Rfl: 0 .  levothyroxine (SYNTHROID) 50 MCG tablet, TAKE 1 TABLET DAILY MON-SAT AND 1&1/2 TABLETS ON SUN, Disp: 100 tablet, Rfl: 0 .  montelukast (SINGULAIR)  10 MG tablet, TAKE 1 TABLET (10 MG TOTAL) BY MOUTH AT BEDTIME., Disp: 90 tablet, Rfl: 1 .  OLANZapine (ZYPREXA) 5 MG tablet, TAKE 1 TABLET AT BEDTIME, Disp: 90 tablet, Rfl: 0 .  omeprazole (PRILOSEC) 20 MG capsule, TAKE 1 CAPSULE EVERY DAY, Disp: 90 capsule, Rfl: 1 .  rosuvastatin  (CRESTOR) 40 MG tablet, Take 1 tablet (40 mg total) by mouth daily., Disp: 90 tablet, Rfl: 0 .  triamterene-hydrochlorothiazide (MAXZIDE-25) 37.5-25 MG tablet, TAKE 1 TABLET EVERY DAY, Disp: 90 tablet, Rfl: 0 .  VOLTAREN 1 % GEL, APPLY TO THE AFFECTED AREA(S) TWICE A DAY AS NEEDED FOR PAIN, Disp: 100 g, Rfl: 0 .  acetaminophen (TYLENOL) 500 MG tablet, Take 500 mg by mouth every 6 (six) hours as needed (pain). (Patient not taking: No sig reported), Disp: , Rfl:  .  albuterol (PROVENTIL HFA;VENTOLIN HFA) 108 (90 Base) MCG/ACT inhaler, Inhale 2 puffs into the lungs every 6 (six) hours as needed for wheezing or shortness of breath. (Patient not taking: Reported on 07/13/2020), Disp: 1 Inhaler, Rfl: 0 .  traZODone (DESYREL) 100 MG tablet, Take 1-2 tablets (100-200 mg total) by mouth at bedtime as needed for sleep., Disp: 180 tablet, Rfl: 1 Medication Side Effects: none  Family Medical/ Social History: Changes? No  MENTAL HEALTH EXAM:  There were no vitals taken for this visit.There is no height or weight on file to calculate BMI.  General Appearance: Unable to assess  Eye Contact:  Unable to assess  Speech:  Clear and Coherent and Normal Rate  Volume:  Normal  Mood:  Euthymic  Affect:  Unable to assess  Thought Process:  Goal Directed and Descriptions of Associations: Intact  Orientation:  Full (Time, Place, and Person)  Thought Content: Logical   Suicidal Thoughts:  No  Homicidal Thoughts:  No  Memory:  WNL  Judgement:  Good  Insight:  Good  Psychomotor Activity:  Unable to assess  Concentration:  Concentration: Good  Recall:  Good  Fund of Knowledge: Good  Language: Good  Assets:  Desire for Improvement  ADL's:  Intact  Cognition: WNL  Prognosis:  Good   Labs: 12/15/2019 CBC with differential completely normal Lipids: 185, Trig 82, HDL 78, LDL 92. CMP glucose 100, creatinine 1.13, remainder of values are also normal. TSH normal Vitamin D normal at 44.1  DIAGNOSES:     ICD-10-CM   1. Bipolar 1 disorder, mixed, moderate (HCC)  F31.62 traZODone (DESYREL) 100 MG tablet  2. Insomnia, unspecified type  G47.00   3. Complaint related to dreams  R41.89     Receiving Psychotherapy: No    RECOMMENDATIONS:  PDMP was reviewed. I provided 30 minutes of face-to-face time during this encounter, including time spent before and after the visit in records review and charting.  Specifically review of labs. As far as the vivid dreams go, I recommend she increase the trazodone.  She is only taking 100 mg and increasing the dose may help her get in a deeper stage of sleep and stay there as she should until time to wake up.  Hopefully she will not have as many bad dreams.  If they get to a point where they are nightmares and are extremely disturbing, let me know and we may need to add prazosin or doxazosin.  I would want to see her in person if we did that though, to make sure her blood pressure is within normal limits.  She understands.  Sleep hygiene discussed.  Also can take  melatonin OTC. Continue Wellbutrin XL 150 mg, 1 p.o. daily. Continue Lamictal 200 mg, 1 p.o. daily. Continue Zyprexa 5 mg, 1 p.o. nightly. Increase trazodone 100 mg up to 2 pills nightly as needed sleep. Return in 6 months.  Donnal Moat, PA-C

## 2020-07-31 ENCOUNTER — Telehealth: Payer: Self-pay | Admitting: Physician Assistant

## 2020-07-31 NOTE — Telephone Encounter (Signed)
Rebecca Rodgers called in report that she is now taking 2 trazodone per day.  It seems much better. Just wanted you to be aware of the change in medication. Does not need a refill at this time.

## 2020-07-31 NOTE — Telephone Encounter (Signed)
FYI

## 2020-08-06 ENCOUNTER — Other Ambulatory Visit: Payer: Self-pay | Admitting: Family Medicine

## 2020-08-07 NOTE — Telephone Encounter (Signed)
Noted  

## 2020-08-08 ENCOUNTER — Other Ambulatory Visit: Payer: Self-pay

## 2020-08-08 ENCOUNTER — Telehealth: Payer: Self-pay | Admitting: Physician Assistant

## 2020-08-08 DIAGNOSIS — F3162 Bipolar disorder, current episode mixed, moderate: Secondary | ICD-10-CM

## 2020-08-08 MED ORDER — TRAZODONE HCL 100 MG PO TABS: 100.0000 mg | ORAL_TABLET | Freq: Every evening | ORAL | 1 refills | Status: DC | PRN

## 2020-08-08 NOTE — Telephone Encounter (Signed)
noted 

## 2020-08-08 NOTE — Telephone Encounter (Signed)
Rx sent 

## 2020-08-08 NOTE — Telephone Encounter (Signed)
Pt called requesting a Rx for Trazodone for 90 day to [facility] mail order. 100 mg 2/d. Has 1 RF left on Rx @ [facility]. Will be N/C if mail order. Apt 10/21

## 2020-08-11 ENCOUNTER — Telehealth: Payer: Self-pay | Admitting: Physician Assistant

## 2020-08-11 NOTE — Telephone Encounter (Signed)
Pt left message that she wants to talk about problems with her medications. Please call.

## 2020-08-14 NOTE — Telephone Encounter (Signed)
Tried reaching pt but her voice mail box is full. Will continue to try.

## 2020-08-17 NOTE — Telephone Encounter (Signed)
Please try her 1 more time.  If you cannot reach her or leave a message, we can close out this note, she will just have to call back if she needs something.

## 2020-08-18 NOTE — Telephone Encounter (Signed)
Pt stated her trazodone was sent as 1 a day but you discussed taking 2 a day.It was a 90 day Rx sent through [facility],I told her taking 2 a day is fine but she is afraid when she runs out they won't let her get another Rx since it will be technically too early.

## 2020-08-18 NOTE — Telephone Encounter (Signed)
Pt informed.She already has the rx from [facility] and will call us if she has any trouble picking up Rx from [facility] when its time

## 2020-08-18 NOTE — Telephone Encounter (Signed)
noted 

## 2020-08-18 NOTE — Telephone Encounter (Signed)
On 08/08/20, I sent in #180 pills, so enough for 3 months, plus 1 RF. It went to [facility], where she requested, I thought.

## 2020-08-20 ENCOUNTER — Other Ambulatory Visit: Payer: Self-pay | Admitting: Family Medicine

## 2020-08-22 NOTE — Progress Notes (Signed)
Subjective:   Rebecca Rodgers is a 65 y.o. female who presents for Medicare Annual (Subsequent) preventive examination.  I connected with Elissa Hefty today by telephone and verified that I am speaking with the correct person using two identifiers. Location patient: home Location provider: work Persons participating in the virtual visit: patient, provider.   I discussed the limitations, risks, security and privacy concerns of performing an evaluation and management service by telephone and the availability of in person appointments. I also discussed with the patient that there may be a patient responsible charge related to this service. The patient expressed understanding and verbally consented to this telephonic visit.    Interactive audio and video telecommunications were attempted between this provider and patient, however failed, due to patient having technical difficulties OR patient did not have access to video capability.  We continued and completed visit with audio only.      Review of Systems    N/A  Cardiac Risk Factors include: hypertension;dyslipidemia     Objective:    Today's Vitals   There is no height or weight on file to calculate BMI.  Advanced Directives 08/23/2020 01/26/2018 07/23/2017 06/27/2016 05/21/2016 03/29/2016  Does Patient Have a Medical Advance Directive? No Yes Yes No No No  Does patient want to make changes to medical advance directive? No - Patient declined - No - Patient declined - - -  Would patient like information on creating a medical advance directive? - - - - Yes (MAU/Ambulatory/Procedural Areas - Information given) No - Patient declined    Current Medications (verified) Outpatient Encounter Medications as of 08/23/2020  Medication Sig  . acetaminophen (TYLENOL) 500 MG tablet Take 500 mg by mouth every 6 (six) hours as needed (pain).  Marland Kitchen amLODipine (NORVASC) 10 MG tablet TAKE 1 TABLET EVERY DAY  . budesonide-formoterol (SYMBICORT) 160-4.5 MCG/ACT  inhaler INHALE 2 PUFFS TWICE DAILY  . buPROPion (WELLBUTRIN XL) 150 MG 24 hr tablet TAKE 1 TABLET ONCE A DAY  . cholecalciferol (VITAMIN D) 1000 units tablet Take 2,000 Units by mouth every Tuesday.   . clotrimazole-betamethasone (LOTRISONE) cream APPLY TO AFFECTED AREAS TWICE A DAY  . ezetimibe (ZETIA) 10 MG tablet TAKE 1 TABLET EVERY DAY  . lamoTRIgine (LAMICTAL) 200 MG tablet TAKE 1 TABLET DAILY  . levothyroxine (SYNTHROID) 50 MCG tablet TAKE 1 TABLET DAILY MON-SAT AND 1&1/2 TABLETS ON SUN  . montelukast (SINGULAIR) 10 MG tablet TAKE 1 TABLET (10 MG TOTAL) BY MOUTH AT BEDTIME.  Marland Kitchen OLANZapine (ZYPREXA) 5 MG tablet TAKE 1 TABLET AT BEDTIME  . omeprazole (PRILOSEC) 20 MG capsule TAKE 1 CAPSULE EVERY DAY  . rosuvastatin (CRESTOR) 40 MG tablet Take 1 tablet (40 mg total) by mouth daily.  . traZODone (DESYREL) 100 MG tablet Take 1-2 tablets (100-200 mg total) by mouth at bedtime as needed for sleep.  Marland Kitchen triamterene-hydrochlorothiazide (MAXZIDE-25) 37.5-25 MG tablet TAKE 1 TABLET EVERY DAY  . vitamin C (ASCORBIC ACID) 500 MG tablet Take 500 mg by mouth daily.  . VOLTAREN 1 % GEL APPLY TO THE AFFECTED AREA(S) TWICE A DAY AS NEEDED FOR PAIN  . albuterol (PROVENTIL HFA;VENTOLIN HFA) 108 (90 Base) MCG/ACT inhaler Inhale 2 puffs into the lungs every 6 (six) hours as needed for wheezing or shortness of breath. (Patient not taking: No sig reported)   No facility-administered encounter medications on file as of 08/23/2020.    Allergies (verified) Codeine and Penicillins   History: Past Medical History:  Diagnosis Date  . Asthma   .  Hx of hysterectomy   . Hyperlipidemia   . Hypertension   . Hypothyroidism   . Suicide attempt ([place]) 1997   when a relationship broke up    . Suicide attempt ([place])    3 months ago because of ill health of her parents   . Uterine cancer ([place]) 2005   Past Surgical History:  Procedure Laterality Date  . ABDOMINAL HYSTERECTOMY    . CARPAL TUNNEL RELEASE  1998    bilateral    . COLONOSCOPY  12/20/2010   Procedure: COLONOSCOPY;  Surgeon: Rogene Houston, MD;  [facility];  Service: Endoscopy;  Laterality: N/A;  . COLONOSCOPY N/A 06/27/2016   Procedure: COLONOSCOPY;  Surgeon: Rogene Houston, MD;  Location: [facility];  Service: Endoscopy;  Laterality: N/A;  1030  . DILATION AND CURETTAGE OF UTERUS     at 20   . VESICOVAGINAL FISTULA CLOSURE W/ TAH  2002   dur to uterine cancer    Family History  Problem Relation Age of Onset  . Cataracts Mother   . Hypertension Mother   . Hyperlipidemia Mother   . Dementia Mother   . Depression Mother   . Alzheimer's disease Mother   . Coronary artery disease Father   . Hyperlipidemia Father   . Stroke Father   . CVA Father   . Cancer Sister 6       breast   . Cancer Brother 40       prostate   Social History   Socioeconomic History  . Marital status: Single    Spouse name: Not on file  . Number of children: Not on file  . Years of education: Not on file  . Highest education level: Not on file  Occupational History  . Occupation: disabled  Tobacco Use  . Smoking status: Former Smoker    Packs/day: 0.25    Years: 15.00    Pack years: 3.75    Types: Cigarettes    Quit date: 03/09/2013    Years since quitting: 7.4  . Smokeless tobacco: Former Network engineer  . Vaping Use: Former  Substance and Sexual Activity  . Alcohol use: No    Alcohol/week: 0.0 standard drinks  . Drug use: No  . Sexual activity: Not Currently    Birth control/protection: Other-see comments, Surgical  Other Topics Concern  . Not on file  Social History Narrative   Partner passed in the summer of 2018. This has been very difficult on her.    Social Determinants of Health   Financial Resource Strain: Low Risk   . Difficulty of Paying Living Expenses: Not hard at all  Food Insecurity: No Food Insecurity  . Worried About Charity fundraiser in the Last Year: Never true  . Ran Out of Food in the Last  Year: Never true  Transportation Needs: No Transportation Needs  . Lack of Transportation (Medical): No  . Lack of Transportation (Non-Medical): No  Physical Activity: Insufficiently Active  . Days of Exercise per Week: 7 days  . Minutes of Exercise per Session: 20 min  Stress: No Stress Concern Present  . Feeling of Stress : Not at all  Social Connections: Socially Isolated  . Frequency of Communication with Friends and Family: More than three times a week  . Frequency of Social Gatherings with Friends and Family: More than three times a week  . Attends Religious Services: Never  . Active Member of Clubs or Organizations: No  .  Attends Archivist Meetings: Never  . Marital Status: Never married    Tobacco Counseling Counseling given: Not Answered   Clinical Intake:  Pre-visit preparation completed: Yes  Pain : No/denies pain     Nutritional Risks: None Diabetes: No  How often do you need to have someone help you when you read instructions, pamphlets, or other written materials from your doctor or pharmacy?: 1 - Never What is the last grade level you completed in school?: some college  Diabetic?No   Interpreter Needed?: No  Information entered by :: [place] of Daily Living In your present state of health, do you have any difficulty performing the following activities: 08/23/2020  Hearing? Y  Comment has bilateral hearing aids  Vision? Y  Difficulty concentrating or making decisions? Y  Comment has some issues with remembering numbers or appointments  Walking or climbing stairs? N  Dressing or bathing? N  Doing errands, shopping? N  Preparing Food and eating ? N  Using the Toilet? N  In the past six months, have you accidently leaked urine? N  Do you have problems with loss of bowel control? N  Managing your Medications? N  Managing your Finances? N  Housekeeping or managing your Housekeeping? N  Some recent data might be hidden     Patient Care Team: Fayrene Helper, MD as PCP - General Harrington Challenger Su Ley, MD as Consulting Physician [facility])  Indicate any recent Medical Services you may have received from other than [facility] providers in the past year (date may be approximate).     Assessment:   This is a routine wellness examination for Ellasyn.  Hearing/Vision screen  Hearing Screening   125Hz  250Hz  500Hz  1000Hz  2000Hz  3000Hz  4000Hz  6000Hz  8000Hz   Right ear:           Left ear:           Vision Screening Comments: Patient stated gets eyes examined once per year. Currently wears glasses with bifocals.  Dietary issues and exercise activities discussed: Current Exercise Habits: The patient does not participate in regular exercise at present  Goals Addressed            This Visit's Progress   . Exercise 150 min/wk Moderate Activity       I plan to go to the gym       Depression Screen PHQ 2/9 Scores 08/23/2020 12/15/2019 08/02/2019 12/09/2018 07/28/2018 07/27/2018 02/23/2018  PHQ - 2 Score 0 0 0 0 0 0 2  PHQ- 9 Score - - 0 - - - 5    Fall Risk Fall Risk  08/23/2020 08/02/2019 12/09/2018 07/28/2018 07/27/2018  Falls in the past year? 0 0 0 0 0  Number falls in past yr: 0 0 0 - -  Injury with Fall? 0 0 0 0 0  Risk for fall due to : No Fall Risks - - - -  Follow up Falls evaluation completed;Falls prevention discussed - - - -  Comment - - - - -    FALL RISK PREVENTION PERTAINING TO THE HOME:  Any stairs in or around the home? No  If so, are there any without handrails? No  Home free of loose throw rugs in walkways, pet beds, electrical cords, etc? Yes  Adequate lighting in your home to reduce risk of falls? Yes   ASSISTIVE DEVICES UTILIZED TO PREVENT FALLS:  Life alert? No  Use of a cane, walker or w/c? No  Grab bars in the  bathroom? Yes  Shower chair or bench in shower? No  Elevated toilet seat or a handicapped toilet? No    Cognitive Function:     6CIT Screen 08/23/2020 08/02/2019  07/28/2018 07/23/2017 05/21/2016  What Year? - 0 points 0 points 0 points 0 points  What month? - 0 points 0 points 0 points 0 points  What time? 0 points 0 points 0 points 0 points 0 points  Count back from 20 0 points 0 points 0 points 0 points 0 points  Months in reverse 0 points 0 points 0 points 0 points 0 points  Repeat phrase 4 points 2 points 2 points 0 points 0 points  Total Score - 2 2 0 0    Immunizations Immunization History  Administered Date(s) Administered  . Influenza Split 01/08/2005, 01/08/2012, 12/08/2013, 01/06/2015  . Influenza Whole 12/29/2008, 11/28/2010  . Influenza,inj,Quad PF,6+ Mos 01/15/2013, 02/20/2016, 02/19/2017, 02/23/2018, 12/09/2018, 12/15/2019  . Influenza-Unspecified 12/08/2013  . Moderna Sars-Covid-2 Vaccination 06/05/2019, 07/21/2019, 03/07/2020  . Pneumococcal Conjugate-13 10/26/2013  . Tdap 03/26/2010, 09/17/2016  . Zoster Recombinat (Shingrix) 11/05/2016, 02/25/2017    TDAP status: Up to date  Flu Vaccine status: Up to date  Pneumococcal vaccine status: Up to date  Covid-19 vaccine status: Completed vaccines  Qualifies for Shingles Vaccine? Yes   Zostavax completed No   Shingrix Completed?: Yes  Screening Tests Health Maintenance  Topic Date Due  . INFLUENZA VACCINE  10/23/2020  . MAMMOGRAM  12/19/2020  . PAP SMEAR-Modifier  02/23/2021  . COLONOSCOPY (Pts 45-22yrs Insurance coverage will need to be confirmed)  06/27/2021  . TETANUS/TDAP  09/18/2026  . COVID-19 Vaccine  Completed  . Hepatitis C Screening  Completed  . HIV Screening  Completed  . Zoster Vaccines- Shingrix  Completed  . HPV VACCINES  Aged Out    Health Maintenance  There are no preventive care reminders to display for this patient.  Colorectal cancer screening: Type of screening: Colonoscopy. Completed 06/27/2016. Repeat every 5 years  Mammogram status: Completed 12/20/2019. Repeat every year  Bone Density Status: Not due until age 39  Lung Cancer Screening:  (Low Dose CT Chest recommended if Age 38-80 years, 30 pack-year currently smoking OR have quit w/in 15years.) does not qualify.   Lung Cancer Screening Referral: N/A   Additional Screening:  Hepatitis C Screening: does qualify; Completed 11/29/2014  Vision Screening: Recommended annual ophthalmology exams for early detection of glaucoma and other disorders of the eye. Is the patient up to date with their annual eye exam?  Yes  Who is the provider or what is the name of the office in which the patient attends annual eye exams? [facility] in [place] If pt is not established with a provider, would they like to be referred to a provider to establish care? No .   Dental Screening: Recommended annual dental exams for proper oral hygiene  Community Resource Referral / Chronic Care Management: CRR required this visit?  No   CCM required this visit?  No      Plan:     I have personally reviewed and noted the following in the patient's chart:   . Medical and social history . Use of alcohol, tobacco or illicit drugs  . Current medications and supplements including opioid prescriptions.  . Functional ability and status . Nutritional status . Physical activity . Advanced directives . List of other physicians . Hospitalizations, surgeries, and ER visits in previous 12 months . Vitals . Screenings to include cognitive, depression, and falls .  Referrals and appointments  In addition, I have reviewed and discussed with patient certain preventive protocols, quality metrics, and best practice recommendations. A written personalized care plan for preventive services as well as general preventive health recommendations were provided to patient.     Ofilia Neas, LPN   07/29/8614   Nurse Notes: None

## 2020-08-23 ENCOUNTER — Ambulatory Visit (INDEPENDENT_AMBULATORY_CARE_PROVIDER_SITE_OTHER): Payer: Medicare HMO

## 2020-08-23 ENCOUNTER — Other Ambulatory Visit: Payer: Self-pay

## 2020-08-23 DIAGNOSIS — Z0000 Encounter for general adult medical examination without abnormal findings: Secondary | ICD-10-CM | POA: Diagnosis not present

## 2020-08-23 NOTE — Patient Instructions (Signed)
Rebecca Rodgers , Thank you for taking time to come for your Medicare Wellness Visit. I appreciate your ongoing commitment to your health goals. Please review the following plan we discussed and let me know if I can assist you in the future.   Screening recommendations/referrals: Colonoscopy: Up to date, next due 06/27/2021 Mammogram: Up to date, next due 12/19/2020 Bone Density: Not due until age 65 Recommended yearly ophthalmology/optometry visit for glaucoma screening and checkup Recommended yearly dental visit for hygiene and checkup  Vaccinations: Influenza vaccine: Up to date, next due fall 2022 Pneumococcal vaccine: Next due at age 48 Tdap vaccine: Up to date, next due 09/18/2026 Shingles vaccine: Completed series    Advanced directives: Advance directive discussed with you today. Even though you declined this today please call our office should you change your mind and we can give you the proper paperwork for you to fill out.   Conditions/risks identified: None  Next appointment: 08/29/2021 @ 11:00 am with [place] 40-64 Years, Female Preventive care refers to lifestyle choices and visits with your health care provider that can promote health and wellness. What does preventive care include?  A yearly physical exam. This is also called an annual well check.  Dental exams once or twice a year.  Routine eye exams. Ask your health care provider how often you should have your eyes checked.  Personal lifestyle choices, including:  Daily care of your teeth and gums.  Regular physical activity.  Eating a healthy diet.  Avoiding tobacco and drug use.  Limiting alcohol use.  Practicing safe sex.  Taking low-dose aspirin daily starting at age 11.  Taking vitamin and mineral supplements as recommended by your health care provider. What happens during an annual well check? The services and screenings done by your health care provider during  your annual well check will depend on your age, overall health, lifestyle risk factors, and family history of disease. Counseling  Your health care provider may ask you questions about your:  Alcohol use.  Tobacco use.  Drug use.  Emotional well-being.  Home and relationship well-being.  Sexual activity.  Eating habits.  Work and work Statistician.  Method of birth control.  Menstrual cycle.  Pregnancy history. Screening  You may have the following tests or measurements:  Height, weight, and BMI.  Blood pressure.  Lipid and cholesterol levels. These may be checked every 5 years, or more frequently if you are over 68 years old.  Skin check.  Lung cancer screening. You may have this screening every year starting at age 34 if you have a 30-pack-year history of smoking and currently smoke or have quit within the past 15 years.  Fecal occult blood test (FOBT) of the stool. You may have this test every year starting at age 57.  Flexible sigmoidoscopy or colonoscopy. You may have a sigmoidoscopy every 5 years or a colonoscopy every 10 years starting at age 110.  Hepatitis C blood test.  Hepatitis B blood test.  Sexually transmitted disease (STD) testing.  Diabetes screening. This is done by checking your blood sugar (glucose) after you have not eaten for a while (fasting). You may have this done every 1-3 years.  Mammogram. This may be done every 1-2 years. Talk to your health care provider about when you should start having regular mammograms. This may depend on whether you have a family history of breast cancer.  BRCA-related cancer screening. This may be done if you have a family  history of breast, ovarian, tubal, or peritoneal cancers.  Pelvic exam and Pap test. This may be done every 3 years starting at age 62. Starting at age 1, this may be done every 5 years if you have a Pap test in combination with an HPV test.  Bone density scan. This is done to screen for  osteoporosis. You may have this scan if you are at high risk for osteoporosis. Discuss your test results, treatment options, and if necessary, the need for more tests with your health care provider. Vaccines  Your health care provider may recommend certain vaccines, such as:  Influenza vaccine. This is recommended every year.  Tetanus, diphtheria, and acellular pertussis (Tdap, Td) vaccine. You may need a Td booster every 10 years.  Zoster vaccine. You may need this after age 22.  Pneumococcal 13-valent conjugate (PCV13) vaccine. You may need this if you have certain conditions and were not previously vaccinated.  Pneumococcal polysaccharide (PPSV23) vaccine. You may need one or two doses if you smoke cigarettes or if you have certain conditions. Talk to your health care provider about which screenings and vaccines you need and how often you need them. This information is not intended to replace advice given to you by your health care provider. Make sure you discuss any questions you have with your health care provider. Document Released: 04/07/2015 Document Revised: 11/29/2015 Document Reviewed: 01/10/2015 Elsevier Interactive Patient Education  2017 [place] Prevention in the Home Falls can cause injuries. They can happen to people of all ages. There are many things you can do to make your home safe and to help prevent falls. What can I do on the outside of my home?  Regularly fix the edges of walkways and driveways and fix any cracks.  Remove anything that might make you trip as you walk through a door, such as a raised step or threshold.  Trim any bushes or trees on the path to your home.  Use bright outdoor lighting.  Clear any walking paths of anything that might make someone trip, such as rocks or tools.  Regularly check to see if handrails are loose or broken. Make sure that both sides of any steps have handrails.  Any raised decks and porches should have  guardrails on the edges.  Have any leaves, snow, or ice cleared regularly.  Use sand or salt on walking paths during winter.  Clean up any spills in your garage right away. This includes oil or grease spills. What can I do in the bathroom?  Use night lights.  Install grab bars by the toilet and in the tub and shower. Do not use towel bars as grab bars.  Use non-skid mats or decals in the tub or shower.  If you need to sit down in the shower, use a plastic, non-slip stool.  Keep the floor dry. Clean up any water that spills on the floor as soon as it happens.  Remove soap buildup in the tub or shower regularly.  Attach bath mats securely with double-sided non-slip rug tape.  Do not have throw rugs and other things on the floor that can make you trip. What can I do in the bedroom?  Use night lights.  Make sure that you have a light by your bed that is easy to reach.  Do not use any sheets or blankets that are too big for your bed. They should not hang down onto the floor.  Have a firm chair  that has side arms. You can use this for support while you get dressed.  Do not have throw rugs and other things on the floor that can make you trip. What can I do in the kitchen?  Clean up any spills right away.  Avoid walking on wet floors.  Keep items that you use a lot in easy-to-reach places.  If you need to reach something above you, use a strong step stool that has a grab bar.  Keep electrical cords out of the way.  Do not use floor polish or wax that makes floors slippery. If you must use wax, use non-skid floor wax.  Do not have throw rugs and other things on the floor that can make you trip. What can I do with my stairs?  Do not leave any items on the stairs.  Make sure that there are handrails on both sides of the stairs and use them. Fix handrails that are broken or loose. Make sure that handrails are as long as the stairways.  Check any carpeting to make sure that  it is firmly attached to the stairs. Fix any carpet that is loose or worn.  Avoid having throw rugs at the top or bottom of the stairs. If you do have throw rugs, attach them to the floor with carpet tape.  Make sure that you have a light switch at the top of the stairs and the bottom of the stairs. If you do not have them, ask someone to add them for you. What else can I do to help prevent falls?  Wear shoes that:  Do not have high heels.  Have rubber bottoms.  Are comfortable and fit you well.  Are closed at the toe. Do not wear sandals.  If you use a stepladder:  Make sure that it is fully opened. Do not climb a closed stepladder.  Make sure that both sides of the stepladder are locked into place.  Ask someone to hold it for you, if possible.  Clearly mark and make sure that you can see:  Any grab bars or handrails.  First and last steps.  Where the edge of each step is.  Use tools that help you move around (mobility aids) if they are needed. These include:  Canes.  Walkers.  Scooters.  Crutches.  Turn on the lights when you go into a dark area. Replace any light bulbs as soon as they burn out.  Set up your furniture so you have a clear path. Avoid moving your furniture around.  If any of your floors are uneven, fix them.  If there are any pets around you, be aware of where they are.  Review your medicines with your doctor. Some medicines can make you feel dizzy. This can increase your chance of falling. Ask your doctor what other things that you can do to help prevent falls. This information is not intended to replace advice given to you by your health care provider. Make sure you discuss any questions you have with your health care provider. Document Released: 01/05/2009 Document Revised: 08/17/2015 Document Reviewed: 04/15/2014 Elsevier Interactive Patient Education  2017 [employer].

## 2020-09-04 ENCOUNTER — Other Ambulatory Visit: Payer: Self-pay | Admitting: Family Medicine

## 2020-09-06 DIAGNOSIS — H35363 Drusen (degenerative) of macula, bilateral: Secondary | ICD-10-CM | POA: Diagnosis not present

## 2020-09-06 DIAGNOSIS — H5200 Hypermetropia, unspecified eye: Secondary | ICD-10-CM | POA: Diagnosis not present

## 2020-09-06 DIAGNOSIS — E7800 Pure hypercholesterolemia, unspecified: Secondary | ICD-10-CM | POA: Diagnosis not present

## 2020-09-06 DIAGNOSIS — Z0100 Encounter for examination of eyes and vision without abnormal findings: Secondary | ICD-10-CM | POA: Diagnosis not present

## 2020-10-02 ENCOUNTER — Other Ambulatory Visit: Payer: Self-pay | Admitting: Physician Assistant

## 2020-10-02 DIAGNOSIS — F3162 Bipolar disorder, current episode mixed, moderate: Secondary | ICD-10-CM

## 2020-10-15 ENCOUNTER — Other Ambulatory Visit: Payer: Self-pay | Admitting: Family Medicine

## 2020-10-23 ENCOUNTER — Other Ambulatory Visit: Payer: Self-pay | Admitting: Physician Assistant

## 2020-10-23 DIAGNOSIS — F3162 Bipolar disorder, current episode mixed, moderate: Secondary | ICD-10-CM

## 2020-12-15 ENCOUNTER — Other Ambulatory Visit (HOSPITAL_COMMUNITY): Payer: Self-pay | Admitting: Family Medicine

## 2020-12-15 DIAGNOSIS — Z1231 Encounter for screening mammogram for malignant neoplasm of breast: Secondary | ICD-10-CM

## 2020-12-20 ENCOUNTER — Encounter: Payer: Self-pay | Admitting: Family Medicine

## 2020-12-20 ENCOUNTER — Other Ambulatory Visit: Payer: Self-pay

## 2020-12-20 ENCOUNTER — Ambulatory Visit (HOSPITAL_COMMUNITY)
Admission: RE | Admit: 2020-12-20 | Discharge: 2020-12-20 | Disposition: A | Discharge status: Home / Self Care | Payer: Medicare HMO | Source: Ambulatory Visit | Attending: Family Medicine | Admitting: Family Medicine

## 2020-12-20 ENCOUNTER — Ambulatory Visit (INDEPENDENT_AMBULATORY_CARE_PROVIDER_SITE_OTHER): Payer: Medicare HMO | Admitting: Family Medicine

## 2020-12-20 ENCOUNTER — Other Ambulatory Visit (HOSPITAL_COMMUNITY)
Admission: RE | Admit: 2020-12-20 | Discharge: 2020-12-20 | Disposition: A | Discharge status: Home / Self Care | Payer: Medicare HMO | Source: Ambulatory Visit | Attending: Family Medicine | Admitting: Family Medicine

## 2020-12-20 VITALS — BP 120/77 | HR 88 | Resp 16 | Ht 63.0 in | Wt 184.0 lb

## 2020-12-20 DIAGNOSIS — E559 Vitamin D deficiency, unspecified: Secondary | ICD-10-CM

## 2020-12-20 DIAGNOSIS — Z124 Encounter for screening for malignant neoplasm of cervix: Secondary | ICD-10-CM | POA: Insufficient documentation

## 2020-12-20 DIAGNOSIS — E038 Other specified hypothyroidism: Secondary | ICD-10-CM

## 2020-12-20 DIAGNOSIS — R7302 Impaired glucose tolerance (oral): Secondary | ICD-10-CM

## 2020-12-20 DIAGNOSIS — Z1151 Encounter for screening for human papillomavirus (HPV): Secondary | ICD-10-CM | POA: Insufficient documentation

## 2020-12-20 DIAGNOSIS — Z01419 Encounter for gynecological examination (general) (routine) without abnormal findings: Secondary | ICD-10-CM | POA: Diagnosis not present

## 2020-12-20 DIAGNOSIS — E785 Hyperlipidemia, unspecified: Secondary | ICD-10-CM

## 2020-12-20 DIAGNOSIS — Z0000 Encounter for general adult medical examination without abnormal findings: Secondary | ICD-10-CM

## 2020-12-20 DIAGNOSIS — Z1231 Encounter for screening mammogram for malignant neoplasm of breast: Secondary | ICD-10-CM

## 2020-12-20 DIAGNOSIS — Z23 Encounter for immunization: Secondary | ICD-10-CM | POA: Diagnosis not present

## 2020-12-20 NOTE — Assessment & Plan Note (Signed)

## 2020-12-20 NOTE — Progress Notes (Signed)
    Rebecca Rodgers     MRN: [id number]      DOB: Mar 29, 1955  HPI: Patient is in for annual physical exam. No other health concerns are expressed or addressed at the visit. Recent labs,  are reviewed. Immunization is reviewed , and  updated if needed.   PE: BP 120/77   Pulse 88   Resp 16   Ht 5\' 3"  (1.6 m)   Wt 184 lb (83.5 kg)   SpO2 95%   BMI 32.59 kg/m   Pleasant  female, alert and oriented x 3, in no cardio-pulmonary distress. Afebrile. HEENT No facial trauma or asymetry. Sinuses non tender.  Extra occullar muscles intact.. External ears normal, . Neck: supple, no adenopathy,JVD or thyromegaly.No bruits.  Chest: Clear to ascultation bilaterally.No crackles or wheezes. Non tender to palpation   Cardiovascular system; Heart sounds normal,  S1 and  S2 ,no S3.  No murmur, or thrill. Apical beat not displaced Peripheral pulses normal.  Abdomen: Soft, non tender, no organomegaly or masses. No bruits. Bowel sounds normal. No guarding, tenderness or rebound.   GU: External genitalia normal female genitalia , normal female distribution of hair. No lesions. Urethral meatus normal in size, no  Prolapse, no lesions visibly  Present. Bladder non tender. Vagina pink and moist , with no visible lesions , discharge present . Adequate pelvic support no  cystocele or rectocele noted  Uterus absent,  no adnexal masses, no  adnexal tenderness.   Musculoskeletal exam: Full ROM of spine, hips , shoulders and knees. No deformity ,swelling or crepitus noted. No muscle wasting or atrophy.   Neurologic: Cranial nerves 2 to 12 intact. Power, tone ,sensation and reflexes normal throughout. No disturbance in gait. No tremor.  Skin: Intact, no ulceration, erythema , scaling or rash noted. Pigmentation normal throughout  Psych; Normal mood and affect. Judgement and concentration normal   Assessment & Plan:  Annual physical exam Annual exam as documented. Counseling  done  re healthy lifestyle involving commitment to 150 minutes exercise per week, heart healthy diet, and attaining healthy weight.The importance of adequate sleep also discussed. Regular seat belt use and home safety, is also discussed. Changes in health habits are decided on by the patient with goals and time frames  set for achieving them. Immunization and cancer screening needs are specifically addressed at this visit.

## 2020-12-20 NOTE — Patient Instructions (Addendum)
F/U in 6 months, call if you need me sooner Please bring medications to next visit  Annual exam in 366 days  Pap today  Flu vaccine today  12 pound weight loss goal in next 6 months  Fasting CBC, lipid, cmp and EGFR, tSH , HBA1C and vit D in next 1 week  Think about what you will eat, plan ahead. Choose " clean, green, fresh or frozen" over canned, processed or packaged foods which are more sugary, salty and fatty. 70 to 75% of food eaten should be vegetables and fruit. Three meals at set times with snacks allowed between meals, but they must be fruit or vegetables. Aim to eat over a 12 hour period , example 7 am to 7 pm, and STOP after  your last meal of the day. Drink water,generally about 64 ounces per day, no other drink is as healthy. Fruit juice is best enjoyed in a healthy way, by EATING the fruit.  It is important that you exercise regularly at least 30 minutes 5 times a week. If you develop chest pain, have severe difficulty breathing, or feel very tired, stop exercising immediately and seek medical attention  Thanks for choosing [facility], we consider it a privelige to serve you.

## 2020-12-22 ENCOUNTER — Encounter: Payer: Self-pay | Admitting: Family Medicine

## 2020-12-25 ENCOUNTER — Other Ambulatory Visit (HOSPITAL_COMMUNITY): Payer: Self-pay | Admitting: Family Medicine

## 2020-12-25 DIAGNOSIS — R928 Other abnormal and inconclusive findings on diagnostic imaging of breast: Secondary | ICD-10-CM

## 2020-12-25 LAB — CYTOLOGY - PAP
Comment: NEGATIVE
Diagnosis: NEGATIVE
High risk HPV: NEGATIVE

## 2020-12-27 ENCOUNTER — Telehealth: Payer: Self-pay

## 2020-12-27 NOTE — Telephone Encounter (Signed)
Patient returning call.

## 2020-12-27 NOTE — Telephone Encounter (Signed)
Pt aware of results 

## 2020-12-28 DIAGNOSIS — E559 Vitamin D deficiency, unspecified: Secondary | ICD-10-CM | POA: Diagnosis not present

## 2020-12-28 DIAGNOSIS — E785 Hyperlipidemia, unspecified: Secondary | ICD-10-CM | POA: Diagnosis not present

## 2020-12-28 DIAGNOSIS — E038 Other specified hypothyroidism: Secondary | ICD-10-CM | POA: Diagnosis not present

## 2020-12-28 DIAGNOSIS — R7302 Impaired glucose tolerance (oral): Secondary | ICD-10-CM | POA: Diagnosis not present

## 2020-12-29 ENCOUNTER — Other Ambulatory Visit: Payer: Self-pay | Admitting: Family Medicine

## 2020-12-29 LAB — CBC
Hematocrit: 42.8 % (ref 34.0–46.6)
Hemoglobin: 13.9 g/dL (ref 11.1–15.9)
MCH: 29.6 pg (ref 26.6–33.0)
MCHC: 32.5 g/dL (ref 31.5–35.7)
MCV: 91 fL (ref 79–97)
Platelets: 389 10*3/uL (ref 150–450)
RBC: 4.7 x10E6/uL (ref 3.77–5.28)
RDW: 13.4 % (ref 11.7–15.4)
WBC: 8.1 10*3/uL (ref 3.4–10.8)

## 2020-12-29 LAB — CMP14+EGFR
ALT: 14 IU/L (ref 0–32)
AST: 20 IU/L (ref 0–40)
Albumin/Globulin Ratio: 1.5 (ref 1.2–2.2)
Albumin: 4.7 g/dL (ref 3.8–4.8)
Alkaline Phosphatase: 82 IU/L (ref 44–121)
BUN/Creatinine Ratio: 16 (ref 12–28)
BUN: 16 mg/dL (ref 8–27)
Bilirubin Total: 0.2 mg/dL (ref 0.0–1.2)
CO2: 20 mmol/L (ref 20–29)
Calcium: 9.9 mg/dL (ref 8.7–10.3)
Chloride: 101 mmol/L (ref 96–106)
Creatinine, Ser: 1 mg/dL (ref 0.57–1.00)
Globulin, Total: 3.1 g/dL (ref 1.5–4.5)
Glucose: 108 mg/dL — ABNORMAL HIGH (ref 70–99)
Potassium: 3.8 mmol/L (ref 3.5–5.2)
Sodium: 140 mmol/L (ref 134–144)
Total Protein: 7.8 g/dL (ref 6.0–8.5)
eGFR: 63 mL/min/{1.73_m2} (ref >59)

## 2020-12-29 LAB — VITAMIN D 25 HYDROXY (VIT D DEFICIENCY, FRACTURES): Vit D, 25-Hydroxy: 43.6 ng/mL (ref 30.0–100.0)

## 2020-12-29 LAB — LIPID PANEL
Chol/HDL Ratio: 2.5 ratio (ref 0.0–4.4)
Cholesterol, Total: 185 mg/dL (ref 100–199)
HDL: 73 mg/dL (ref >39)
LDL Chol Calc (NIH): 98 mg/dL (ref 0–99)
Triglycerides: 77 mg/dL (ref 0–149)
VLDL Cholesterol Cal: 14 mg/dL (ref 5–40)

## 2020-12-29 LAB — HEMOGLOBIN A1C
Est. average glucose Bld gHb Est-mCnc: 117 mg/dL
Hgb A1c MFr Bld: 5.7 % — ABNORMAL HIGH (ref 4.8–5.6)

## 2020-12-29 LAB — TSH: TSH: 2.71 u[IU]/mL (ref 0.450–4.500)

## 2021-01-08 ENCOUNTER — Encounter: Payer: Self-pay | Admitting: Physician Assistant

## 2021-01-08 ENCOUNTER — Other Ambulatory Visit: Payer: Self-pay

## 2021-01-08 ENCOUNTER — Ambulatory Visit: Payer: Medicare HMO | Admitting: Physician Assistant

## 2021-01-08 DIAGNOSIS — G4700 Insomnia, unspecified: Secondary | ICD-10-CM

## 2021-01-08 DIAGNOSIS — F3178 Bipolar disorder, in full remission, most recent episode mixed: Secondary | ICD-10-CM

## 2021-01-08 MED ORDER — LAMOTRIGINE 200 MG PO TABS: 200.0000 mg | ORAL_TABLET | Freq: Every day | ORAL | 1 refills | Status: DC

## 2021-01-08 MED ORDER — BUPROPION HCL ER (XL) 150 MG PO TB24: 150.0000 mg | ORAL_TABLET | Freq: Every day | ORAL | 1 refills | Status: DC

## 2021-01-08 MED ORDER — TRAZODONE HCL 100 MG PO TABS: 100.0000 mg | ORAL_TABLET | Freq: Every evening | ORAL | 1 refills | Status: DC | PRN

## 2021-01-08 MED ORDER — OLANZAPINE 5 MG PO TABS: 5.0000 mg | ORAL_TABLET | Freq: Every day | ORAL | 1 refills | Status: DC

## 2021-01-08 NOTE — Progress Notes (Signed)
[facility]  Patient ID: Rebecca Rodgers,  MRN: [id number]  PCP: Fayrene Helper, MD  Date of Evaluation: 01/08/2021 Time spent:30 minutes  Chief Complaint:  Chief Complaint   Depression; Insomnia; Follow-up      HISTORY/CURRENT STATUS: HPI For 6 mo med check.  Doing well. Mental health is good. Patient denies loss of interest in usual activities and is able to enjoy things.  Denies decreased energy or motivation.  Appetite has not changed.  No extreme sadness, tearfulness, or feelings of hopelessness.  Denies any changes in concentration, making decisions or remembering things. Sleeps well w/ Trazodone. 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 Not having a lot of anxiety.  Denies suicidal or homicidal thoughts.  Patient denies increased energy with decreased need for sleep, no increased talkativeness, no racing thoughts, no impulsivity or risky behaviors, no increased spending, no increased libido, no grandiosity, no increased irritability or anger, and no hallucinations.  Denies dizziness, syncope, seizures, numbness, tingling, tremor, tics, unsteady gait, slurred speech, confusion.  Denies muscle or joint pain, stiffness, or dystonia.  Individual Medical History/ Review of Systems: Changes? :Yes   has an abnormal mammogram, is having further w/u next week.   Past medications for mental health diagnoses include: Lamictal, Trazodone, Zyprexa, Wellbutrin, others she can't remember.   Allergies: Codeine and Penicillins  Current Medications:  Current Outpatient Medications:    acetaminophen (TYLENOL) 500 MG tablet, Take 500 mg by mouth every 6 (six) hours as needed (pain)., Disp: , Rfl:    albuterol (PROVENTIL HFA;VENTOLIN HFA) 108 (90 Base) MCG/ACT inhaler, Inhale 2 puffs into the lungs every 6 (six) hours as needed for wheezing or shortness of breath., Disp: 1 Inhaler, Rfl: 0   amLODipine (NORVASC) 10 MG  tablet, TAKE 1 TABLET EVERY DAY, Disp: 90 tablet, Rfl: 1   budesonide-formoterol (SYMBICORT) 160-4.5 MCG/ACT inhaler, INHALE 2 PUFFS TWICE DAILY, Disp: 3 each, Rfl: 0   cholecalciferol (VITAMIN D) 1000 units tablet, Take 2,000 Units by mouth every Tuesday. , Disp: , Rfl:    clotrimazole-betamethasone (LOTRISONE) cream, APPLY TO AFFECTED AREAS TWICE A DAY, Disp: 45 g, Rfl: 0   ezetimibe (ZETIA) 10 MG tablet, TAKE 1 TABLET EVERY DAY, Disp: 90 tablet, Rfl: 0   levothyroxine (SYNTHROID) 50 MCG tablet, TAKE 1 TABLET DAILY MONDAY THROUGH SATURDAY AND TAKE 1 AND 1/2 TABLETS ON SUNDAY, Disp: 97 tablet, Rfl: 0   montelukast (SINGULAIR) 10 MG tablet, TAKE 1 TABLET AT BEDTIME, Disp: 90 tablet, Rfl: 1   omeprazole (PRILOSEC) 20 MG capsule, TAKE 1 CAPSULE EVERY DAY, Disp: 90 capsule, Rfl: 1   rosuvastatin (CRESTOR) 40 MG tablet, TAKE 1 TABLET (40 MG TOTAL) BY MOUTH DAILY., Disp: 90 tablet, Rfl: 0   triamterene-hydrochlorothiazide (MAXZIDE-25) 37.5-25 MG tablet, TAKE 1 TABLET EVERY DAY, Disp: 90 tablet, Rfl: 0   vitamin C (ASCORBIC ACID) 500 MG tablet, Take 500 mg by mouth daily., Disp: , Rfl:    VOLTAREN 1 % GEL, APPLY TO THE AFFECTED AREA(S) TWICE A DAY AS NEEDED FOR PAIN, Disp: 100 g, Rfl: 0   buPROPion (WELLBUTRIN XL) 150 MG 24 hr tablet, Take 1 tablet (150 mg total) by mouth daily., Disp: 90 tablet, Rfl: 1   lamoTRIgine (LAMICTAL) 200 MG tablet, Take 1 tablet (200 mg total) by mouth daily., Disp: 90 tablet, Rfl: 1   OLANZapine (ZYPREXA) 5 MG tablet, Take 1 tablet (5 mg total) by mouth at bedtime., Disp: 90 tablet, Rfl:  1   traZODone (DESYREL) 100 MG tablet, Take 1-2 tablets (100-200 mg total) by mouth at bedtime as needed for sleep., Disp: 180 tablet, Rfl: 1 Medication Side Effects: none  Family Medical/ Social History: Changes? No  MENTAL HEALTH EXAM:  There were no vitals taken for this visit.There is no height or weight on file to calculate BMI.  General Appearance: Casual and Well Groomed  Eye  Contact:  Good  Speech:  Clear and Coherent and Normal Rate  Volume:  Normal  Mood:  Euthymic  Affect:  Appropriate  Thought Process:  Goal Directed and Descriptions of Associations: Intact  Orientation:  Full (Time, Place, and Person)  Thought Content: Logical   Suicidal Thoughts:  No  Homicidal Thoughts:  No  Memory:  WNL  Judgement:  Good  Insight:  Good  Psychomotor Activity:  Normal  Concentration:  Concentration: Good  Recall:  Good  Fund of Knowledge: Good  Language: Good  Assets:  Desire for Improvement  ADL's:  Intact  Cognition: WNL  Prognosis:  Good   Labs 12/28/2020 CBC nl CMP Glu 108 o/w nl. Lipids all nl TSH nl A1C 5.7 VPA 43.6   DIAGNOSES:    ICD-10-CM   1. Bipolar disorder, in full remission, most recent episode mixed (HCC)  F31.78 traZODone (DESYREL) 100 MG tablet    OLANZapine (ZYPREXA) 5 MG tablet    lamoTRIgine (LAMICTAL) 200 MG tablet    buPROPion (WELLBUTRIN XL) 150 MG 24 hr tablet    2. Insomnia, unspecified type  G47.00        Receiving Psychotherapy: No    RECOMMENDATIONS:  PDMP was reviewed.  No results available. I provided 30 minutes of face to face time during this encounter, including time spent before and after the visit in records review, medical decision making, and charting.  Doing well w/ mental health so no changes needed. Continue Wellbutrin XL 150 mg, 1 p.o. daily. Continue Lamictal 200 mg, 1 p.o. daily. Continue Zyprexa 5 mg, 1 p.o. nightly. Continue trazodone 100 mg, 1-2 p.o. nightly as needed sleep.   Return in 6 months.  Donnal Moat, PA-C

## 2021-01-11 ENCOUNTER — Other Ambulatory Visit: Payer: Self-pay | Admitting: Family Medicine

## 2021-01-17 ENCOUNTER — Ambulatory Visit (HOSPITAL_COMMUNITY)
Admission: RE | Admit: 2021-01-17 | Discharge: 2021-01-17 | Disposition: A | Discharge status: Home / Self Care | Payer: Medicare HMO | Source: Ambulatory Visit | Attending: Family Medicine | Admitting: Family Medicine

## 2021-01-17 ENCOUNTER — Other Ambulatory Visit: Payer: Self-pay

## 2021-01-17 DIAGNOSIS — R928 Other abnormal and inconclusive findings on diagnostic imaging of breast: Secondary | ICD-10-CM | POA: Diagnosis not present

## 2021-01-17 DIAGNOSIS — R922 Inconclusive mammogram: Secondary | ICD-10-CM | POA: Diagnosis not present

## 2021-02-02 ENCOUNTER — Other Ambulatory Visit: Payer: Self-pay | Admitting: Family Medicine

## 2021-05-24 ENCOUNTER — Other Ambulatory Visit: Payer: Self-pay | Admitting: Family Medicine

## 2021-06-06 ENCOUNTER — Encounter: Payer: Self-pay | Admitting: Family Medicine

## 2021-06-06 ENCOUNTER — Other Ambulatory Visit (HOSPITAL_COMMUNITY): Payer: Self-pay | Admitting: Family Medicine

## 2021-06-06 ENCOUNTER — Ambulatory Visit (INDEPENDENT_AMBULATORY_CARE_PROVIDER_SITE_OTHER): Payer: Medicare HMO | Admitting: Family Medicine

## 2021-06-06 ENCOUNTER — Telehealth: Payer: Self-pay | Admitting: Family Medicine

## 2021-06-06 ENCOUNTER — Other Ambulatory Visit: Payer: Self-pay

## 2021-06-06 VITALS — BP 123/64 | HR 84 | Ht 63.0 in | Wt 183.1 lb

## 2021-06-06 DIAGNOSIS — E8881 Metabolic syndrome: Secondary | ICD-10-CM

## 2021-06-06 DIAGNOSIS — Z131 Encounter for screening for diabetes mellitus: Secondary | ICD-10-CM

## 2021-06-06 DIAGNOSIS — F3177 Bipolar disorder, in partial remission, most recent episode mixed: Secondary | ICD-10-CM

## 2021-06-06 DIAGNOSIS — E038 Other specified hypothyroidism: Secondary | ICD-10-CM | POA: Diagnosis not present

## 2021-06-06 DIAGNOSIS — R7302 Impaired glucose tolerance (oral): Secondary | ICD-10-CM | POA: Diagnosis not present

## 2021-06-06 DIAGNOSIS — E785 Hyperlipidemia, unspecified: Secondary | ICD-10-CM

## 2021-06-06 DIAGNOSIS — Z23 Encounter for immunization: Secondary | ICD-10-CM

## 2021-06-06 DIAGNOSIS — Z13820 Encounter for screening for osteoporosis: Secondary | ICD-10-CM

## 2021-06-06 DIAGNOSIS — Z1231 Encounter for screening mammogram for malignant neoplasm of breast: Secondary | ICD-10-CM

## 2021-06-06 DIAGNOSIS — I10 Essential (primary) hypertension: Secondary | ICD-10-CM

## 2021-06-06 NOTE — Telephone Encounter (Signed)
Patient needs bone density orders in  ?

## 2021-06-06 NOTE — Patient Instructions (Signed)
Annual exam early October, call if ylou need me sooner ? ?Please schedule dexa at checkout, due  ? ?Please schedule September mammogram at checkout ? ?Pneumonia 20 today ? ?HBA1C, chem 7 and EGFR today ? ?It is important that you exercise regularly at least 30 minutes 5 times a week. If you develop chest pain, have severe difficulty breathing, or feel very tired, stop exercising immediately and seek medical attention  ? ?Think about what you will eat, plan ahead. ?Choose " clean, green, fresh or frozen" over canned, processed or packaged foods which are more sugary, salty and fatty. ?70 to 75% of food eaten should be vegetables and fruit. ?Three meals at set times with snacks allowed between meals, but they must be fruit or vegetables. ?Aim to eat over a 12 hour period , example 7 am to 7 pm, and STOP after  your last meal of the day. ?Drink water,generally about 64 ounces per day, no other drink is as healthy. Fruit juice is best enjoyed in a healthy way, by EATING the fruit. ?Thanks for choosing [facility], we consider it a privelige to serve you. ?CONGRATS on being nicotine and alcohol free. ? ?Best for Spring and  Summer! ?

## 2021-06-06 NOTE — Telephone Encounter (Signed)
Dexa order entered  ?

## 2021-06-10 ENCOUNTER — Other Ambulatory Visit: Payer: Self-pay | Admitting: Family Medicine

## 2021-06-17 ENCOUNTER — Encounter: Payer: Self-pay | Admitting: Family Medicine

## 2021-06-17 NOTE — Assessment & Plan Note (Signed)
Controlled, no change in medication  

## 2021-06-17 NOTE — Assessment & Plan Note (Signed)
Managed by Psych, controlled on current meds ?

## 2021-06-17 NOTE — Assessment & Plan Note (Signed)
DASH diet and commitment to daily physical activity for a minimum of 30 minutes discussed and encouraged, as a part of hypertension management. ?The importance of attaining a healthy weight is also discussed. ?Controlled, no change in medication ? ? ? ?  06/06/2021  ?  1:11 PM 12/20/2020  ?  2:12 PM 12/15/2019  ? 12:57 PM 08/02/2019  ?  1:18 PM 12/09/2018  ? 12:58 PM 07/28/2018  ?  2:40 PM 07/27/2018  ?  1:28 PM  ?BP/Weight  ?Systolic BP 569 794 801  655 120 120  ?Diastolic BP 64 77 67  78 70 70  ?Wt. (Lbs) 183.08 184 175 172 167 167   ?BMI 32.43 kg/m2 32.59 kg/m2 31 kg/m2 30.47 kg/m2 29.58 kg/m2 29.58 kg/m2   ? ? ? ? ?

## 2021-06-17 NOTE — Assessment & Plan Note (Signed)
The increased risk of cardiovascular disease associated with this diagnosis, and the need to consistently work on lifestyle to change this is discussed. Following  a  heart healthy diet ,commitment to 30 minutes of exercise at least 5 days per week, as well as control of blood sugar and cholesterol , and achieving a healthy weight are all the areas to be addressed .  

## 2021-06-17 NOTE — Progress Notes (Signed)
? ?Rebecca Rodgers     MRN: [id number]      DOB: 1956-03-04 ? ? ?HPI ?Rebecca Rodgers is here for follow up and re-evaluation of chronic medical conditions, medication management and review of any available recent lab and radiology data.  ?Preventive health is updated, specifically  Cancer screening and Immunization.   ?Questions or concerns regarding consultations or procedures which the PT has had in the interim are  addressed. ?The PT denies any adverse reactions to current medications since the last visit.  ?Left 5th toe pain with no inciting trauma  ?ROS ?Denies recent fever or chills. ?Denies sinus pressure, nasal congestion, ear pain or sore throat. ?Denies chest congestion, productive cough or wheezing. ?Denies chest pains, palpitations and leg swelling ?Denies abdominal pain, nausea, vomiting,diarrhea or constipation.   ?Denies dysuria, frequency, hesitancy or incontinence. ? ?Denies headaches, seizures, numbness, or tingling. ?Denies uncontrolled depression, anxiety or insomnia. ?Denies skin break down or rash. ? ? ?PE ? ?BP 123/64   Pulse 84   Ht '5\' 3"'$  (1.6 m)   Wt 183 lb 1.3 oz (83 kg)   SpO2 96%   BMI 32.43 kg/m?  ? ?Patient alert and oriented and in no cardiopulmonary distress. ? ?HEENT: No facial asymmetry, EOMI,     Neck supple . ? ?Chest: Clear to auscultation bilaterally. ? ?CVS: S1, S2 no murmurs, no S3.Regular rate. ? ?ABD: Soft non tender.  ? ?Ext: No edema ? ?MS: Adequate ROM spine, shoulders, hips and knees. ? ?Skin: Intact, no ulcerations or rash noted. ? ?Psych: Good eye contact, normal affect. Memory intact not anxious or depressed appearing. ? ?CNS: CN 2-12 intact, power,  normal throughout.no focal deficits noted. ? ? ?Assessment & Plan ? ?Essential hypertension ?DASH diet and commitment to daily physical activity for a minimum of 30 minutes discussed and encouraged, as a part of hypertension management. ?The importance of attaining a healthy weight is also discussed. ?Controlled, no change  in medication ? ? ? ?  06/06/2021  ?  1:11 PM 12/20/2020  ?  2:12 PM 12/15/2019  ? 12:57 PM 08/02/2019  ?  1:18 PM 12/09/2018  ? 12:58 PM 07/28/2018  ?  2:40 PM 07/27/2018  ?  1:28 PM  ?BP/Weight  ?Systolic BP 244 010 272  536 120 120  ?Diastolic BP 64 77 67  78 70 70  ?Wt. (Lbs) 183.08 184 175 172 167 167   ?BMI 32.43 kg/m2 32.59 kg/m2 31 kg/m2 30.47 kg/m2 29.58 kg/m2 29.58 kg/m2   ? ? ? ? ? ?Hyperlipidemia ?Hyperlipidemia:Low fat diet discussed and encouraged. ? ? ?Lipid Panel  ?Lab Results  ?Component Value Date  ? CHOL 185 12/28/2020  ? HDL 73 12/28/2020  ? [place] 98 12/28/2020  ? TRIG 77 12/28/2020  ? CHOLHDL 2.5 12/28/2020  ? ? ? ?Updated lab needed at/ before next visit. ? ? ?IGT (impaired glucose tolerance) ?Patient educated about the importance of limiting  Carbohydrate intake , the need to commit to daily physical activity for a minimum of 30 minutes , and to commit weight loss. ?The fact that changes in all these areas will reduce or eliminate all together the development of diabetes is stressed.  ?Updated lab needed at/ before next visit. ? ? ?  Latest Ref Rng & Units 06/06/2021  ?  2:52 PM 12/28/2020  ? 10:28 AM 12/15/2019  ?  1:55 PM 12/22/2018  ? 12:39 PM 07/21/2018  ? 10:50 AM  ?Diabetic Labs  ?HbA1c % CANCELED   5.7  5.6   5.7    ?Chol 100 - 199 mg/dL  185   185   183   185    ?HDL >39 mg/dL  73   78   65   70    ?Calc LDL 0 - 99 mg/dL  98   92   101   99    ?Triglycerides 0 - 149 mg/dL  77   82   84   74    ?Creatinine  WILL FOLLOW  P 1.00   1.13   1.19   0.99    ?  ?P Preliminary result  ? ? ?  06/06/2021  ?  1:11 PM 12/20/2020  ?  2:12 PM 12/15/2019  ? 12:57 PM 08/02/2019  ?  1:18 PM 12/09/2018  ? 12:58 PM 07/28/2018  ?  2:40 PM 07/27/2018  ?  1:28 PM  ?BP/Weight  ?Systolic BP 678 938 101  751 120 120  ?Diastolic BP 64 77 67  78 70 70  ?Wt. (Lbs) 183.08 184 175 172 167 167   ?BMI 32.43 kg/m2 32.59 kg/m2 31 kg/m2 30.47 kg/m2 29.58 kg/m2 29.58 kg/m2   ? ?   ? View : No data to display.  ?  ?  ?  ? ? ? ? ?Bipolar  disorder ([place]) ?Managed by Psych, controlled on current meds ? ?Hypothyroidism ?Controlled, no change in medication ? ? ?Metabolic syndrome X ?The increased risk of cardiovascular disease associated with this diagnosis, and the need to consistently work on lifestyle to change this is discussed. ?Following  a  heart healthy diet ,commitment to 30 minutes of exercise at least 5 days per week, as well as control of blood sugar and cholesterol , and achieving a healthy weight are all the areas to be addressed . ? ? ?

## 2021-06-17 NOTE — Assessment & Plan Note (Addendum)
Patient educated about the importance of limiting  Carbohydrate intake , the need to commit to daily physical activity for a minimum of 30 minutes , and to commit weight loss. ?The fact that changes in all these areas will reduce or eliminate all together the development of diabetes is stressed.  ?Updated lab needed at/ before next visit. ? ? ?  Latest Ref Rng & Units 06/06/2021  ?  2:52 PM 12/28/2020  ? 10:28 AM 12/15/2019  ?  1:55 PM 12/22/2018  ? 12:39 PM 07/21/2018  ? 10:50 AM  ?Diabetic Labs  ?HbA1c % CANCELED   5.7    5.6   5.7    ?Chol 100 - 199 mg/dL  185   185   183   185    ?HDL >39 mg/dL  73   78   65   70    ?Calc LDL 0 - 99 mg/dL  98   92   101   99    ?Triglycerides 0 - 149 mg/dL  77   82   84   74    ?Creatinine  WILL FOLLOW  P 1.00   1.13   1.19   0.99    ?  ?P Preliminary result  ? ? ?  06/06/2021  ?  1:11 PM 12/20/2020  ?  2:12 PM 12/15/2019  ? 12:57 PM 08/02/2019  ?  1:18 PM 12/09/2018  ? 12:58 PM 07/28/2018  ?  2:40 PM 07/27/2018  ?  1:28 PM  ?BP/Weight  ?Systolic BP 481 856 314  970 120 120  ?Diastolic BP 64 77 67  78 70 70  ?Wt. (Lbs) 183.08 184 175 172 167 167   ?BMI 32.43 kg/m2 32.59 kg/m2 31 kg/m2 30.47 kg/m2 29.58 kg/m2 29.58 kg/m2   ? ?   ? View : No data to display.  ?  ?  ?  ? ? ? ?

## 2021-06-17 NOTE — Assessment & Plan Note (Signed)
Hyperlipidemia:Low fat diet discussed and encouraged. ? ? ?Lipid Panel  ?Lab Results  ?Component Value Date  ? CHOL 185 12/28/2020  ? HDL 73 12/28/2020  ? [place] 98 12/28/2020  ? TRIG 77 12/28/2020  ? CHOLHDL 2.5 12/28/2020  ? ? ? ?Updated lab needed at/ before next visit. ? ?

## 2021-06-18 ENCOUNTER — Encounter (INDEPENDENT_AMBULATORY_CARE_PROVIDER_SITE_OTHER): Payer: Self-pay | Admitting: *Deleted

## 2021-06-19 ENCOUNTER — Ambulatory Visit: Payer: Medicare HMO | Admitting: Family Medicine

## 2021-06-21 LAB — BASIC METABOLIC PANEL

## 2021-06-21 LAB — HEMOGLOBIN A1C

## 2021-06-28 ENCOUNTER — Other Ambulatory Visit: Payer: Self-pay | Admitting: Family Medicine

## 2021-06-28 ENCOUNTER — Other Ambulatory Visit: Payer: Self-pay | Admitting: Physician Assistant

## 2021-06-28 DIAGNOSIS — F3178 Bipolar disorder, in full remission, most recent episode mixed: Secondary | ICD-10-CM

## 2021-07-09 ENCOUNTER — Ambulatory Visit: Payer: Medicare HMO | Admitting: Physician Assistant

## 2021-07-12 ENCOUNTER — Other Ambulatory Visit (INDEPENDENT_AMBULATORY_CARE_PROVIDER_SITE_OTHER): Payer: Self-pay

## 2021-07-12 DIAGNOSIS — Z86010 Personal history of colonic polyps: Secondary | ICD-10-CM

## 2021-07-13 ENCOUNTER — Telehealth (INDEPENDENT_AMBULATORY_CARE_PROVIDER_SITE_OTHER): Payer: Self-pay | Admitting: *Deleted

## 2021-07-13 ENCOUNTER — Encounter (INDEPENDENT_AMBULATORY_CARE_PROVIDER_SITE_OTHER): Payer: Self-pay | Admitting: *Deleted

## 2021-07-13 MED ORDER — PEG 3350-KCL-NA BICARB-NACL 420 G PO SOLR: 4000.0000 mL | Freq: Once | ORAL | 0 refills | Status: AC

## 2021-07-13 NOTE — Telephone Encounter (Signed)
Patient needs trilyte 

## 2021-07-13 NOTE — Telephone Encounter (Signed)
Referring MD/PCP: simpson ? ?Procedure: tcs ? ?Reason/Indication:  hx polyps ? ?Has patient had this procedure before?  Yes, 06/2016 ? If so, when, by whom and where?   ? ?Is there a family history of colon cancer?  no ? Who?  What age when diagnosed?   ? ?Is patient diabetic? If yes, Type 1 or Type 2   no ?     ?Does patient have prosthetic heart valve or mechanical valve?  no ? ?Do you have a pacemaker/defibrillator?  no ? ?Has patient ever had endocarditis/atrial fibrillation? no ? ?Does patient use oxygen? no ? ?Has patient had joint replacement within last 12 months?  no ? ?Is patient constipated or do they take laxatives? no ? ?Does patient have a history of alcohol/drug use?  no ? ?Have you had a stroke/heart attack last 6 mths? no ? ?Do you take medicine for weight loss?  no ? ?For female patients,: have you had a hysterectomy yes ?                     are you post menopausal  ?                     do you still have your menstrual cycle no ? ?Is patient on blood thinner such as Coumadin, Plavix and/or Aspirin? no ? ?Medications: rosuvastatin 40 mg daily, trazadone 100 mg daily, bupropion 150 mg daily, olanzapine 5 mg daily, montelukast 10 mg daily, exetimibe 10 mg daily, amlodipine 10 mg daily, lamotrigine 200 mg daily, levothyroxine 50 mg daily, omeprazole 20 m daily, traimterene/hctz 37.5/25 mg daily  ? ?Allergies: ceodine, pcn ? ?Medication Adjustment per Dr Rehman/Dr Jenetta Downer  ? ?Procedure date & time: 08/08/21 ? ? ?

## 2021-07-16 ENCOUNTER — Other Ambulatory Visit: Payer: Self-pay

## 2021-07-16 ENCOUNTER — Telehealth: Payer: Self-pay

## 2021-07-16 ENCOUNTER — Ambulatory Visit (INDEPENDENT_AMBULATORY_CARE_PROVIDER_SITE_OTHER): Payer: Medicare HMO | Admitting: Physician Assistant

## 2021-07-16 ENCOUNTER — Encounter: Payer: Self-pay | Admitting: Physician Assistant

## 2021-07-16 DIAGNOSIS — G4700 Insomnia, unspecified: Secondary | ICD-10-CM | POA: Diagnosis not present

## 2021-07-16 DIAGNOSIS — F3178 Bipolar disorder, in full remission, most recent episode mixed: Secondary | ICD-10-CM | POA: Diagnosis not present

## 2021-07-16 MED ORDER — BUPROPION HCL ER (XL) 150 MG PO TB24: 150.0000 mg | ORAL_TABLET | Freq: Every day | ORAL | 1 refills | Status: DC

## 2021-07-16 MED ORDER — LAMOTRIGINE 200 MG PO TABS: 200.0000 mg | ORAL_TABLET | Freq: Every day | ORAL | 1 refills | Status: DC

## 2021-07-16 MED ORDER — OLANZAPINE 5 MG PO TABS: 5.0000 mg | ORAL_TABLET | Freq: Every day | ORAL | 1 refills | Status: DC

## 2021-07-16 MED ORDER — ALBUTEROL SULFATE HFA 108 (90 BASE) MCG/ACT IN AERS: 2.0000 | INHALATION_SPRAY | Freq: Four times a day (QID) | RESPIRATORY_TRACT | 0 refills | Status: DC | PRN

## 2021-07-16 NOTE — Progress Notes (Signed)
[facility] ? ?Patient ID: Rebecca Rodgers,  ?MRN: [id number] ? ?PCP: Fayrene Helper, MD ? ?Date of Evaluation: 07/16/2021 ?Time spent:20 minutes ? ?Chief Complaint:  ?Chief Complaint   ?Depression; Insomnia ?  ? ? ?HISTORY/CURRENT STATUS: ?HPI For 6 mo med check. ? ?Doing well. Just got back from a trip to [facility] and had a good time. Sleeps well most of the time, unless she's watching a movie or something. Does take the Trazodone most of the time and it does help.  ? ?Patient denies loss of interest in usual activities and is able to enjoy things.  Denies decreased energy or motivation.  Appetite has not changed.  No extreme sadness, tearfulness, or feelings of hopelessness. ADLs and personal hygiene are nl. Not crying easily.  Denies any changes in concentration, making decisions or remembering things.  Denies suicidal or homicidal thoughts. ? ?Patient denies increased energy with decreased need for sleep, no increased talkativeness, no racing thoughts, no impulsivity or risky behaviors, no increased spending, no increased libido, no grandiosity, no increased irritability or anger, no paranoia, and no hallucinations. ? ?Denies dizziness, syncope, seizures, numbness, tingling, tremor, tics, unsteady gait, slurred speech, confusion.  Denies dystonia. Does have bilat knee pain from OA. Denies unexplained weight loss, frequent infections, or sores that heal slowly.  No polyphagia, polydipsia, or polyuria. Denies visual changes or paresthesias.  ? ?Individual Medical History/ Review of Systems: Changes? :Yes   has screening colonoscopy in May. Had an abnormal mammogram in Sept, f/u was neg. ? ? ?Past medications for mental health diagnoses include: ?Lamictal, Trazodone, Zyprexa, Wellbutrin, others she can't remember.  ? ?Allergies: Codeine and Penicillins ? ?Current Medications:  ?Current Outpatient Medications:  ?  acetaminophen (TYLENOL) 500 MG tablet, Take 500 mg by mouth every 6 (six) hours as needed  (pain)., Disp: , Rfl:  ?  amLODipine (NORVASC) 10 MG tablet, TAKE 1 TABLET EVERY DAY, Disp: 90 tablet, Rfl: 1 ?  budesonide-formoterol (SYMBICORT) 160-4.5 MCG/ACT inhaler, INHALE 2 PUFFS TWICE DAILY, Disp: 3 each, Rfl: 0 ?  cholecalciferol (VITAMIN D) 1000 units tablet, Take 2,000 Units by mouth every Tuesday. , Disp: , Rfl:  ?  clotrimazole-betamethasone (LOTRISONE) cream, APPLY TO AFFECTED AREAS TWICE A DAY, Disp: 45 g, Rfl: 0 ?  ezetimibe (ZETIA) 10 MG tablet, TAKE 1 TABLET EVERY DAY, Disp: 90 tablet, Rfl: 0 ?  levothyroxine (SYNTHROID) 50 MCG tablet, TAKE 1 TABLET DAILY MONDAY THROUGH SATURDAY AND TAKE 1 AND 1/2 TABLETS ON SUNDAY, Disp: 97 tablet, Rfl: 0 ?  montelukast (SINGULAIR) 10 MG tablet, TAKE 1 TABLET AT BEDTIME, Disp: 90 tablet, Rfl: 1 ?  omeprazole (PRILOSEC) 20 MG capsule, TAKE 1 CAPSULE EVERY DAY, Disp: 90 capsule, Rfl: 1 ?  rosuvastatin (CRESTOR) 40 MG tablet, TAKE 1 TABLET EVERY DAY, Disp: 90 tablet, Rfl: 0 ?  traZODone (DESYREL) 100 MG tablet, TAKE 1 TO 2 TABLETS (100 TO 200 MG TOTAL) BY MOUTH AT BEDTIME AS NEEDED FOR SLEEP., Disp: 180 tablet, Rfl: 1 ?  triamterene-hydrochlorothiazide (MAXZIDE-25) 37.5-25 MG tablet, TAKE 1 TABLET EVERY DAY, Disp: 90 tablet, Rfl: 0 ?  vitamin C (ASCORBIC ACID) 500 MG tablet, Take 500 mg by mouth daily., Disp: , Rfl:  ?  VOLTAREN 1 % GEL, APPLY TO THE AFFECTED AREA(S) TWICE A DAY AS NEEDED FOR PAIN, Disp: 100 g, Rfl: 0 ?  albuterol (PROVENTIL HFA;VENTOLIN HFA) 108 (90 Base) MCG/ACT inhaler, Inhale 2 puffs into the lungs every 6 (six) hours as needed for wheezing or shortness of  breath. (Patient not taking: Reported on 07/16/2021), Disp: 1 Inhaler, Rfl: 0 ?  buPROPion (WELLBUTRIN XL) 150 MG 24 hr tablet, Take 1 tablet (150 mg total) by mouth daily., Disp: 90 tablet, Rfl: 1 ?  lamoTRIgine (LAMICTAL) 200 MG tablet, Take 1 tablet (200 mg total) by mouth daily., Disp: 90 tablet, Rfl: 1 ?  OLANZapine (ZYPREXA) 5 MG tablet, Take 1 tablet (5 mg total) by mouth at bedtime.,  Disp: 90 tablet, Rfl: 1 ?Medication Side Effects: none ? ?Family Medical/ Social History: Changes? No ? ?MENTAL HEALTH EXAM: ? ?There were no vitals taken for this visit.There is no height or weight on file to calculate BMI.  ?General Appearance: Casual and Well Groomed  ?Eye Contact:  Good  ?Speech:  Clear and Coherent and Normal Rate  ?Volume:  Normal  ?Mood:  Euthymic  ?Affect:  Appropriate  ?Thought Process:  Goal Directed and Descriptions of Associations: Circumstantial  ?Orientation:  Full (Time, Place, and Person)  ?Thought Content: Logical   ?Suicidal Thoughts:  No  ?Homicidal Thoughts:  No  ?Memory:  WNL  ?Judgement:  Good  ?Insight:  Good  ?Psychomotor Activity:  Normal  ?Concentration:  Concentration: Good  ?Recall:  Good  ?Fund of Knowledge: Good  ?Language: Good  ?Assets:  Desire for Improvement  ?ADL's:  Intact  ?Cognition: WNL  ?Prognosis:  Good  ? ?Labs ?None new since Oct 2022.  Will see her PCP next month and labs will be done then if required. ? ?DIAGNOSES:  ?  ICD-10-CM   ?1. Bipolar disorder, in full remission, most recent episode mixed ([place])  F31.78 buPROPion (WELLBUTRIN XL) 150 MG 24 hr tablet  ?  lamoTRIgine (LAMICTAL) 200 MG tablet  ?  OLANZapine (ZYPREXA) 5 MG tablet  ?  ?2. Insomnia, unspecified type  G47.00   ?  ? ? ? ?Receiving Psychotherapy: No  ? ? ?RECOMMENDATIONS:  ?PDMP was reviewed.  No results available. ?I provided 20 minutes of face to face time during this encounter, including time spent before and after the visit in records review, medical decision making, counseling pertinent to today's visit, and charting.  ?She is doing well as far as her mental health medications are concerned.  No changes are necessary. ?Sleep hygiene discussed. ? ?Continue Wellbutrin XL 150 mg, 1 p.o. daily. ?Continue Lamictal 200 mg, 1 p.o. daily. ?Continue Zyprexa 5 mg, 1 p.o. nightly. ?Continue trazodone 100 mg, 1-2 p.o. nightly as needed sleep.   ?Return in 6 months. ? ?Donnal Moat, PA-C  ?

## 2021-07-16 NOTE — Telephone Encounter (Signed)
Patient needs ventolin inhaler sent to [facility] ph# [phone] ?

## 2021-07-16 NOTE — Telephone Encounter (Signed)
Refills sent to pharmacy. 

## 2021-07-25 ENCOUNTER — Other Ambulatory Visit: Payer: Self-pay | Admitting: Family Medicine

## 2021-07-26 ENCOUNTER — Telehealth: Payer: Self-pay | Admitting: Family Medicine

## 2021-07-26 NOTE — Telephone Encounter (Signed)
Patient aware.

## 2021-07-26 NOTE — Telephone Encounter (Signed)
Patient called in regard to colonoscopy on 5/17. ? ?Patient states that she needs the liquid she needs to drink beforehand called into [facility]  ?

## 2021-08-03 NOTE — Patient Instructions (Signed)
? ? ? ? ? ? Rebecca Rodgers ? 08/03/2021  ?  ? '@PREFPERIOPPHARMACY'$ @ ? ? Your procedure is scheduled on  08/08/2021. ? ? Report to [facility] at  [place] A[facility]. ? ? Call this number if you have problems the morning of surgery: ? [phone] ? ? Remember: ? Follow the diet and prep instructions given to you by the office. ? ?  Use your inhaler before you come and bring your rescue inhaler with you. ?  ? Take these medicines the morning of surgery with A SIP OF WATER  ? ?            amlodipine, wellbutrin, levothyroxine, prilosec. ?  ? ? Do not wear jewelry, make-up or nail polish. ? Do not wear lotions, powders, or perfumes, or deodorant. ? Do not shave 48 hours prior to surgery.  Men may shave face and neck. ? Do not bring valuables to the hospital. ? [facility] is not responsible for any belongings or valuables. ? ?Contacts, dentures or bridgework may not be worn into surgery.  Leave your suitcase in the car.  After surgery it may be brought to your room. ? ?For patients admitted to the hospital, discharge time will be determined by your treatment team. ? ?Patients discharged the day of surgery will not be allowed to drive home and must have someone with them for 24 hours.  ? ? ?Special instructions:   DO NOT smoke tobacco or vape for 24 hours before your procedure. ? ?Please read over the following fact sheets that you were given. ?Anesthesia Post-op Instructions and Care and Recovery After Surgery ?  ? ? ? Colonoscopy, Adult, Care After ?The following information offers guidance on how to care for yourself after your procedure. Your health care provider may also give you more specific instructions. If you have problems or questions, contact your health care provider. ?What can I expect after the procedure? ?After the procedure, it is common to have: ?A small amount of blood in your stool for 24 hours after the procedure. ?Some gas. ?Mild cramping or bloating of your abdomen. ?Follow these instructions at home: ?Eating  and drinking ? ?Drink enough fluid to keep your urine pale yellow. ?Follow instructions from your health care provider about eating or drinking restrictions. ?Resume your normal diet as told by your health care provider. Avoid heavy or fried foods that are hard to digest. ?Activity ?Rest as told by your health care provider. ?Avoid sitting for a long time without moving. Get up to take short walks every 1-2 hours. This is important to improve blood flow and breathing. Ask for help if you feel weak or unsteady. ?Return to your normal activities as told by your health care provider. Ask your health care provider what activities are safe for you. ?Managing cramping and bloating ? ?Try walking around when you have cramps or feel bloated. ?If directed, apply heat to your abdomen as told by your health care provider. Use the heat source that your health care provider recommends, such as a moist heat pack or a heating pad. ?Place a towel between your skin and the heat source. ?Leave the heat on for 20-30 minutes. ?Remove the heat if your skin turns bright red. This is especially important if you are unable to feel pain, heat, or cold. You have a greater risk of getting burned. ?General instructions ?If you were given a sedative during the procedure, it can affect you for several hours. Do not drive or  operate machinery until your health care provider says that it is safe. ?For the first 24 hours after the procedure: ?Do not sign important documents. ?Do not drink alcohol. ?Do your regular daily activities at a slower pace than normal. ?Eat soft foods that are easy to digest. ?Take over-the-counter and prescription medicines only as told by your health care provider. ?Keep all follow-up visits. This is important. ?Contact a health care provider if: ?You have blood in your stool 2-3 days after the procedure. ?Get help right away if: ?You have more than a small spotting of blood in your stool. ?You have large blood clots in  your stool. ?You have swelling of your abdomen. ?You have nausea or vomiting. ?You have a fever. ?You have increasing pain in your abdomen that is not relieved with medicine. ?These symptoms may be an emergency. Get help right away. Call 911. ?Do not wait to see if the symptoms will go away. ?Do not drive yourself to the hospital. ?Summary ?After the procedure, it is common to have a small amount of blood in your stool. You may also have mild cramping and bloating of your abdomen. ?If you were given a sedative during the procedure, it can affect you for several hours. Do not drive or operate machinery until your health care provider says that it is safe. ?Get help right away if you have a lot of blood in your stool, nausea or vomiting, a fever, or increased pain in your abdomen. ?This information is not intended to replace advice given to you by your health care provider. Make sure you discuss any questions you have with your health care provider. ?Document Revised: 11/01/2020 Document Reviewed: 11/01/2020 ?Elsevier Patient Education ? [place]. ?Monitored Anesthesia Care, Care After ?This sheet gives you information about how to care for yourself after your procedure. Your health care provider may also give you more specific instructions. If you have problems or questions, contact your health care provider. ?What can I expect after the procedure? ?After the procedure, it is common to have: ?Tiredness. ?Forgetfulness about what happened after the procedure. ?Impaired judgment for important decisions. ?Nausea or vomiting. ?Some difficulty with balance. ?Follow these instructions at home: ?For the time period you were told by your health care provider: ? ?  ? ?Rest as needed. ?Do not participate in activities where you could fall or become injured. ?Do not drive or use machinery. ?Do not drink alcohol. ?Do not take sleeping pills or medicines that cause drowsiness. ?Do not make important decisions or sign  legal documents. ?Do not take care of children on your own. ?Eating and drinking ?Follow the diet that is recommended by your health care provider. ?Drink enough fluid to keep your urine pale yellow. ?If you vomit: ?Drink water, juice, or soup when you can drink without vomiting. ?Make sure you have little or no nausea before eating solid foods. ?General instructions ?Have a responsible adult stay with you for the time you are told. It is important to have someone help care for you until you are awake and alert. ?Take over-the-counter and prescription medicines only as told by your health care provider. ?If you have sleep apnea, surgery and certain medicines can increase your risk for breathing problems. Follow instructions from your health care provider about wearing your sleep device: ?Anytime you are sleeping, including during daytime naps. ?While taking prescription pain medicines, sleeping medicines, or medicines that make you drowsy. ?Avoid smoking. ?Keep all follow-up visits as told by your health  care provider. This is important. ?Contact a health care provider if: ?You keep feeling nauseous or you keep vomiting. ?You feel light-headed. ?You are still sleepy or having trouble with balance after 24 hours. ?You develop a rash. ?You have a fever. ?You have redness or swelling around the IV site. ?Get help right away if: ?You have trouble breathing. ?You have new-onset confusion at home. ?Summary ?For several hours after your procedure, you may feel tired. You may also be forgetful and have poor judgment. ?Have a responsible adult stay with you for the time you are told. It is important to have someone help care for you until you are awake and alert. ?Rest as told. Do not drive or operate machinery. Do not drink alcohol or take sleeping pills. ?Get help right away if you have trouble breathing, or if you suddenly become confused. ?This information is not intended to replace advice given to you by your health  care provider. Make sure you discuss any questions you have with your health care provider. ?Document Revised: 02/13/2021 Document Reviewed: 02/11/2019 ?Elsevier Patient Education ? [place]. ? ?

## 2021-08-06 ENCOUNTER — Encounter (HOSPITAL_COMMUNITY)
Admission: RE | Admit: 2021-08-06 | Discharge: 2021-08-06 | Disposition: A | Discharge status: Home / Self Care | Payer: Medicare HMO | Source: Ambulatory Visit | Attending: Internal Medicine | Admitting: Internal Medicine

## 2021-08-06 ENCOUNTER — Encounter (HOSPITAL_COMMUNITY): Payer: Self-pay

## 2021-08-06 VITALS — BP 132/57 | HR 79 | Temp 97.8°F | Resp 18 | Ht 63.0 in | Wt 181.0 lb

## 2021-08-06 DIAGNOSIS — I10 Essential (primary) hypertension: Secondary | ICD-10-CM | POA: Diagnosis not present

## 2021-08-06 DIAGNOSIS — Z01818 Encounter for other preprocedural examination: Secondary | ICD-10-CM | POA: Insufficient documentation

## 2021-08-06 DIAGNOSIS — Z86010 Personal history of colonic polyps: Secondary | ICD-10-CM | POA: Diagnosis not present

## 2021-08-06 HISTORY — DX: Depression, unspecified: F32.A

## 2021-08-06 HISTORY — DX: Anxiety disorder, unspecified: F41.9

## 2021-08-06 LAB — BASIC METABOLIC PANEL
Anion gap: 8 (ref 5–15)
BUN: 17 mg/dL (ref 8–23)
CO2: 24 mmol/L (ref 22–32)
Calcium: 9.5 mg/dL (ref 8.9–10.3)
Chloride: 107 mmol/L (ref 98–111)
Creatinine, Ser: 1.06 mg/dL — ABNORMAL HIGH (ref 0.44–1.00)
GFR, Estimated: 58 mL/min — ABNORMAL LOW (ref >60)
Glucose, Bld: 97 mg/dL (ref 70–99)
Potassium: 3.5 mmol/L (ref 3.5–5.1)
Sodium: 139 mmol/L (ref 135–145)

## 2021-08-08 ENCOUNTER — Ambulatory Visit (HOSPITAL_COMMUNITY)
Admission: RE | Admit: 2021-08-08 | Discharge: 2021-08-08 | Disposition: A | Discharge status: Home / Self Care | Payer: Medicare HMO | Attending: Internal Medicine | Admitting: Internal Medicine

## 2021-08-08 ENCOUNTER — Ambulatory Visit (HOSPITAL_COMMUNITY): Payer: Medicare HMO | Admitting: Anesthesiology

## 2021-08-08 ENCOUNTER — Encounter (INDEPENDENT_AMBULATORY_CARE_PROVIDER_SITE_OTHER): Payer: Self-pay | Admitting: *Deleted

## 2021-08-08 ENCOUNTER — Encounter (HOSPITAL_COMMUNITY)
Admission: RE | Disposition: A | Discharge status: Home / Self Care | Payer: Self-pay | Source: Home / Self Care | Attending: Internal Medicine

## 2021-08-08 ENCOUNTER — Encounter (HOSPITAL_COMMUNITY): Payer: Self-pay | Admitting: Internal Medicine

## 2021-08-08 ENCOUNTER — Ambulatory Visit (HOSPITAL_BASED_OUTPATIENT_CLINIC_OR_DEPARTMENT_OTHER): Payer: Medicare HMO | Admitting: Anesthesiology

## 2021-08-08 DIAGNOSIS — K635 Polyp of colon: Secondary | ICD-10-CM

## 2021-08-08 DIAGNOSIS — F32A Depression, unspecified: Secondary | ICD-10-CM | POA: Diagnosis not present

## 2021-08-08 DIAGNOSIS — Z1211 Encounter for screening for malignant neoplasm of colon: Secondary | ICD-10-CM | POA: Insufficient documentation

## 2021-08-08 DIAGNOSIS — R0989 Other specified symptoms and signs involving the circulatory and respiratory systems: Secondary | ICD-10-CM

## 2021-08-08 DIAGNOSIS — M1990 Unspecified osteoarthritis, unspecified site: Secondary | ICD-10-CM | POA: Diagnosis not present

## 2021-08-08 DIAGNOSIS — F419 Anxiety disorder, unspecified: Secondary | ICD-10-CM | POA: Diagnosis not present

## 2021-08-08 DIAGNOSIS — D120 Benign neoplasm of cecum: Secondary | ICD-10-CM | POA: Insufficient documentation

## 2021-08-08 DIAGNOSIS — F418 Other specified anxiety disorders: Secondary | ICD-10-CM

## 2021-08-08 DIAGNOSIS — R7302 Impaired glucose tolerance (oral): Secondary | ICD-10-CM

## 2021-08-08 DIAGNOSIS — Z79899 Other long term (current) drug therapy: Secondary | ICD-10-CM | POA: Diagnosis not present

## 2021-08-08 DIAGNOSIS — Z86010 Personal history of colonic polyps: Secondary | ICD-10-CM | POA: Diagnosis not present

## 2021-08-08 DIAGNOSIS — I10 Essential (primary) hypertension: Secondary | ICD-10-CM | POA: Insufficient documentation

## 2021-08-08 DIAGNOSIS — E785 Hyperlipidemia, unspecified: Secondary | ICD-10-CM

## 2021-08-08 DIAGNOSIS — R413 Other amnesia: Secondary | ICD-10-CM

## 2021-08-08 DIAGNOSIS — J309 Allergic rhinitis, unspecified: Secondary | ICD-10-CM

## 2021-08-08 DIAGNOSIS — E8881 Metabolic syndrome: Secondary | ICD-10-CM

## 2021-08-08 DIAGNOSIS — M179 Osteoarthritis of knee, unspecified: Secondary | ICD-10-CM

## 2021-08-08 DIAGNOSIS — J45991 Cough variant asthma: Secondary | ICD-10-CM

## 2021-08-08 DIAGNOSIS — D122 Benign neoplasm of ascending colon: Secondary | ICD-10-CM | POA: Diagnosis not present

## 2021-08-08 DIAGNOSIS — F319 Bipolar disorder, unspecified: Secondary | ICD-10-CM

## 2021-08-08 DIAGNOSIS — Z09 Encounter for follow-up examination after completed treatment for conditions other than malignant neoplasm: Secondary | ICD-10-CM | POA: Diagnosis not present

## 2021-08-08 DIAGNOSIS — E039 Hypothyroidism, unspecified: Secondary | ICD-10-CM | POA: Insufficient documentation

## 2021-08-08 DIAGNOSIS — Z87891 Personal history of nicotine dependence: Secondary | ICD-10-CM | POA: Insufficient documentation

## 2021-08-08 DIAGNOSIS — K5730 Diverticulosis of large intestine without perforation or abscess without bleeding: Secondary | ICD-10-CM

## 2021-08-08 DIAGNOSIS — J45909 Unspecified asthma, uncomplicated: Secondary | ICD-10-CM | POA: Insufficient documentation

## 2021-08-08 HISTORY — PX: POLYPECTOMY: SHX149

## 2021-08-08 HISTORY — PX: COLONOSCOPY WITH PROPOFOL: SHX5780

## 2021-08-08 LAB — HM COLONOSCOPY

## 2021-08-08 SURGERY — COLONOSCOPY WITH PROPOFOL
Anesthesia: General

## 2021-08-08 MED ORDER — LACTATED RINGERS IV SOLN: INTRAVENOUS | Status: DC

## 2021-08-08 MED ORDER — PROPOFOL 10 MG/ML IV BOLUS
INTRAVENOUS | Status: DC | PRN
  Administered 2021-08-08: 70 mg via INTRAVENOUS

## 2021-08-08 MED ORDER — LIDOCAINE HCL 1 % IJ SOLN
INTRAMUSCULAR | Status: DC | PRN
  Administered 2021-08-08: 50 mg via INTRADERMAL

## 2021-08-08 MED ORDER — PROPOFOL 500 MG/50ML IV EMUL
INTRAVENOUS | Status: DC | PRN
  Administered 2021-08-08: 150 ug/kg/min via INTRAVENOUS

## 2021-08-08 NOTE — Anesthesia Postprocedure Evaluation (Signed)
Anesthesia Post Note ? ?Patient: Rebecca Rodgers ? ?Procedure(s) Performed: COLONOSCOPY WITH PROPOFOL ?POLYPECTOMY INTESTINAL ? ?Patient location during evaluation: Short Stay ?Anesthesia Type: General ?Level of consciousness: awake and alert ?Pain management: pain level controlled ?Vital Signs Assessment: post-procedure vital signs reviewed and stable ?Respiratory status: spontaneous breathing ?Cardiovascular status: blood pressure returned to baseline ?Postop Assessment: no apparent nausea or vomiting ?Anesthetic complications: no ? ? ?No notable events documented. ? ? ?Last Vitals:  ?Vitals:  ? 08/08/21 0755 08/08/21 1023  ?BP: 118/76   ?Pulse: 68 76  ?Resp: 14 17  ?Temp: 36.6 ?C 36.6 ?C  ?SpO2: 99% 100%  ?  ?Last Pain:  ?Vitals:  ? 08/08/21 1023  ?TempSrc: Oral  ?PainSc: 0-No pain  ? ? ?  ?  ?  ?  ?  ?  ? ?Asanti Craigo ? ? ? ? ?

## 2021-08-08 NOTE — H&P (Signed)
Rebecca Rodgers is an 66 y.o. female.   ?Chief Complaint: Patient is here for colonoscopy ?HPI: Patient is 66 year old female with history of colonic adenoma and is here for surveillance colonoscopy.  She had an advanced/large adenoma removed back in 2009.  She had few more known adenomatous polyps removed along the way but did not have any polyps on her last exam 5 years ago.  She denies abdominal pain change in bowel habits or rectal bleeding.  Family history is negative for colon cancer. ? ?Past Medical History:  ?Diagnosis Date  ? Anxiety   ? Asthma   ? Depression   ? Hx of hysterectomy   ? Hyperlipidemia   ? Hypertension   ? Hypothyroidism   ? Suicide attempt [facility]) 1997  ? when a relationship broke up    ? Suicide attempt [facility]   ? 3 months ago because of ill health of her parents   ? Uterine cancer (May) 2005  ? ? ?Past Surgical History:  ?Procedure Laterality Date  ? ABDOMINAL HYSTERECTOMY    ? [place]  ? bilateral    ? COLONOSCOPY  12/20/2010  ? Procedure: COLONOSCOPY;  Surgeon: Rogene Houston, MD;  Location: [facility];  Service: Endoscopy;  Laterality: N/A;  ? COLONOSCOPY N/A 06/27/2016  ? Procedure: COLONOSCOPY;  Surgeon: Rogene Houston, MD;  Location: [facility];  Service: Endoscopy;  Laterality: N/A;  1030  ? DILATION AND CURETTAGE OF UTERUS    ? at 54   ? VESICOVAGINAL FISTULA CLOSURE W/ TAH  2002  ? dur to uterine cancer   ? ? ?Family History  ?Problem Relation Age of Onset  ? Cataracts Mother   ? Hypertension Mother   ? Hyperlipidemia Mother   ? Dementia Mother   ? Depression Mother   ? Alzheimer's disease Mother   ? Coronary artery disease Father   ? Hyperlipidemia Father   ? Stroke Father   ? CVA Father   ? Cancer Sister 64  ?     breast   ? Cancer Brother 18  ?     prostate  ? ?Social History:  reports that she quit smoking about 8 years ago. Her smoking use included cigarettes. She has a 3.75 pack-year smoking history. She has quit using smokeless tobacco. She reports  that she does not drink alcohol and does not use drugs. ? ?Allergies:  ?Allergies  ?Allergen Reactions  ? Codeine   ?  Lip swelling   ? Penicillins   ?  Lip swelling ?Has patient had a PCN reaction causing immediate rash, facial/tongue/throat swelling, SOB or lightheadedness with hypotension: Yes ?Has patient had a PCN reaction causing severe rash involving mucus membranes or skin necrosis: No ?Has patient had a PCN reaction that required hospitalization No ?Has patient had a PCN reaction occurring within the last 10 years: No ?If all of the above answers are "NO", then may proceed with Cephalosporin use. ?  ? ? ?Medications Prior to Admission  ?Medication Sig Dispense Refill  ? acetaminophen (TYLENOL) 500 MG tablet Take 500 mg by mouth every 6 (six) hours as needed (pain).    ? albuterol (VENTOLIN HFA) 108 (90 Base) MCG/ACT inhaler Inhale 2 puffs into the lungs every 6 (six) hours as needed for wheezing or shortness of breath. 1 each 0  ? amLODipine (NORVASC) 10 MG tablet TAKE 1 TABLET EVERY DAY 90 tablet 1  ? budesonide-formoterol (SYMBICORT) 160-4.5 MCG/ACT inhaler INHALE 2 PUFFS TWICE DAILY (  Patient taking differently: 2 puffs at bedtime.) 3 each 0  ? buPROPion (WELLBUTRIN XL) 150 MG 24 hr tablet Take 1 tablet (150 mg total) by mouth daily. 90 tablet 1  ? Cholecalciferol (VITAMIN D) 125 MCG (5000 UT) CAPS Take 5,000 Units by mouth every Tuesday.    ? clotrimazole-betamethasone (LOTRISONE) cream APPLY TO AFFECTED AREAS TWICE A DAY (Patient taking differently: 1 application. daily as needed (Rash).) 45 g 0  ? ezetimibe (ZETIA) 10 MG tablet TAKE 1 TABLET EVERY DAY (Patient taking differently: Take 10 mg by mouth at bedtime.) 90 tablet 0  ? lamoTRIgine (LAMICTAL) 200 MG tablet Take 1 tablet (200 mg total) by mouth daily. 90 tablet 1  ? levothyroxine (SYNTHROID) 50 MCG tablet TAKE 1 TABLET DAILY MONDAY THROUGH SATURDAY AND TAKE 1 AND 1/2 TABLETS ON SUNDAY (Patient taking differently: Take 50-75 mcg by mouth daily  before breakfast. TAKE 50 mg  TABLET DAILY MONDAY THROUGH SATURDAY AND TAKE 75 mg TABLETS ON SUNDAY) 97 tablet 0  ? montelukast (SINGULAIR) 10 MG tablet TAKE 1 TABLET AT BEDTIME 90 tablet 1  ? OLANZapine (ZYPREXA) 5 MG tablet Take 1 tablet (5 mg total) by mouth at bedtime. 90 tablet 1  ? omeprazole (PRILOSEC) 20 MG capsule TAKE 1 CAPSULE EVERY DAY 90 capsule 1  ? rosuvastatin (CRESTOR) 40 MG tablet TAKE 1 TABLET EVERY DAY (Patient taking differently: Take 40 mg by mouth at bedtime.) 90 tablet 0  ? traZODone (DESYREL) 100 MG tablet TAKE 1 TO 2 TABLETS (100 TO 200 MG TOTAL) BY MOUTH AT BEDTIME AS NEEDED FOR SLEEP. (Patient taking differently: Take 200 mg by mouth at bedtime.) 180 tablet 1  ? triamterene-hydrochlorothiazide (MAXZIDE-25) 37.5-25 MG tablet TAKE 1 TABLET EVERY DAY 90 tablet 0  ? vitamin C (ASCORBIC ACID) 500 MG tablet Take 500 mg by mouth daily.    ? VOLTAREN 1 % GEL APPLY TO THE AFFECTED AREA(S) TWICE A DAY AS NEEDED FOR PAIN (Patient taking differently: 2 g daily as needed (pain).) 100 g 0  ? ? ?Results for orders placed or performed during the hospital encounter of 08/06/21 (from the past 48 hour(s))  ?Basic metabolic panel     Status: Abnormal  ? Collection Time: 08/06/21  1:26 PM  ?Result Value Ref Range  ? Sodium 139 135 - 145 mmol/L  ? Potassium 3.5 3.5 - 5.1 mmol/L  ? Chloride 107 98 - 111 mmol/L  ? CO2 24 22 - 32 mmol/L  ? Glucose, Bld 97 70 - 99 mg/dL  ?  Comment: Glucose reference range applies only to samples taken after fasting for at least 8 hours.  ? BUN 17 8 - 23 mg/dL  ? Creatinine, Ser 1.06 (H) 0.44 - 1.00 mg/dL  ? Calcium 9.5 8.9 - 10.3 mg/dL  ? GFR, Estimated 58 (L) >60 mL/min  ?  Comment: (NOTE) ?Calculated using the CKD-EPI Creatinine Equation (2021) ?  ? Anion gap 8 5 - 15  ?  Comment: Performed at [facility], [address], [place] at [place], [place] [address]  ? ?No results found. ? ?Review of Systems ? ?Blood pressure 118/76, pulse 68, temperature 97.8 ?F (36.6 ?C), temperature source  Oral, resp. rate 14, height '5\' 3"'$  (1.6 m), weight 82.1 kg, SpO2 99 %. ?Physical Exam ?HENT:  ?   Mouth/Throat:  ?   Mouth: Mucous membranes are moist.  ?   Pharynx: Oropharynx is clear.  ?Eyes:  ?   General: No scleral icterus. ?   Conjunctiva/sclera: Conjunctivae normal.  ?  Cardiovascular:  ?   Rate and Rhythm: Normal rate and regular rhythm.  ?   Heart sounds: Normal heart sounds. No murmur heard. ?Pulmonary:  ?   Effort: Pulmonary effort is normal.  ?   Breath sounds: Normal breath sounds.  ?Abdominal:  ?   General: There is no distension.  ?   Palpations: Abdomen is soft. There is no mass.  ?   Tenderness: There is no abdominal tenderness.  ?Musculoskeletal:     ?   General: No swelling.  ?   Cervical back: Neck supple.  ?Lymphadenopathy:  ?   Cervical: No cervical adenopathy.  ?Skin: ?   General: Skin is warm and dry.  ?Neurological:  ?   Mental Status: She is alert.  ?  ? ?Assessment/Plan ? ?History of advanced colonic adenoma. ?Surveillance colonoscopy. ? ?Hildred Laser, MD ?08/08/2021, 9:44 AM ? ? ? ?

## 2021-08-08 NOTE — Anesthesia Preprocedure Evaluation (Signed)
Anesthesia Evaluation  ?Patient identified by MRN, date of birth, ID band ?Patient awake ? ? ? ?Reviewed: ?Allergy & Precautions, NPO status , Patient's Chart, lab work & pertinent test results ? ?Airway ?Mallampati: II ? ?TM Distance: >3 FB ?Neck ROM: Full ? ? ? Dental ? ?(+) Dental Advisory Given, Teeth Intact ?  ?Pulmonary ?asthma , former smoker,  ?  ?Pulmonary exam normal ?breath sounds clear to auscultation ? ? ? ? ? ? Cardiovascular ?Exercise Tolerance: Good ?hypertension, Pt. on medications ?+ Peripheral Vascular Disease (carotid bruit)  ?Normal cardiovascular exam ?Rhythm:Regular Rate:Normal ? ? ?  ?Neuro/Psych ?PSYCHIATRIC DISORDERS Anxiety Depression Bipolar Disorder Episodic memory lossnegative neurological ROS ?   ? GI/Hepatic ?negative GI ROS, Neg liver ROS,   ?Endo/Other  ?Hypothyroidism  ? Renal/GU ?negative Renal ROS  ?negative genitourinary ?  ?Musculoskeletal ? ?(+) Arthritis , Osteoarthritis,   ? Abdominal ?  ?Peds ?negative pediatric ROS ?(+)  Hematology ?negative hematology ROS ?(+)   ?Anesthesia Other Findings ? ? Reproductive/Obstetrics ?negative OB ROS ? ?  ? ? ? ? ? ? ? ? ? ? ? ? ? ?  ?  ? ? ? ? ? ? ? ? ?Anesthesia Physical ?Anesthesia Plan ? ?ASA: 2 ? ?Anesthesia Plan: General  ? ?Post-op Pain Management: Minimal or no pain anticipated  ? ?Induction: Intravenous ? ?PONV Risk Score and Plan: Propofol infusion ? ?Airway Management Planned: Nasal Cannula and Natural Airway ? ?Additional Equipment:  ? ?Intra-op Plan:  ? ?Post-operative Plan:  ? ?Informed Consent: I have reviewed the patients History and Physical, chart, labs and discussed the procedure including the risks, benefits and alternatives for the proposed anesthesia with the patient or authorized representative who has indicated his/her understanding and acceptance.  ? ? ? ? ? ?Plan Discussed with: CRNA and Surgeon ? ?Anesthesia Plan Comments:   ? ? ? ? ? ? ?Anesthesia Quick Evaluation ? ?

## 2021-08-08 NOTE — Discharge Instructions (Signed)
No aspirin or NSAIDs for 24 hours.   °Resume usual medications as before. °High-fiber diet. °No driving for 24 hours. °Physician will call with biopsy results. °

## 2021-08-08 NOTE — Op Note (Signed)
[facility] ?Patient Name: Rebecca Rodgers ?Procedure Date: 08/08/2021 9:36 AM ?MRN: [id number] ?Date of Birth: Dec 03, 1955 ?Attending MD: Hildred Laser , MD ?CSN: [id number] ?Age: 66 ?Admit Type: Outpatient ?Procedure:                Colonoscopy ?Indications:              High risk colon cancer surveillance: Personal  ?                          history of colonic polyps ?Providers:                Hildred Laser, MD, Tunica Page, Raphael Gibney,  ?                          Technician ?Referring MD:             Tula Nakayama, MD ?Medicines:                Propofol per Anesthesia ?Complications:            No immediate complications. ?Estimated Blood Loss:     Estimated blood loss was minimal. ?Procedure:                Pre-Anesthesia Assessment: ?                          - Prior to the procedure, a History and Physical  ?                          was performed, and patient medications and  ?                          allergies were reviewed. The patient's tolerance of  ?                          previous anesthesia was also reviewed. The risks  ?                          and benefits of the procedure and the sedation  ?                          options and risks were discussed with the patient.  ?                          All questions were answered, and informed consent  ?                          was obtained. Prior Anticoagulants: The patient has  ?                          taken no previous anticoagulant or antiplatelet  ?                          agents. ASA Grade Assessment: II - A patient with  ?  mild systemic disease. After reviewing the risks  ?                          and benefits, the patient was deemed in  ?                          satisfactory condition to undergo the procedure. ?                          After obtaining informed consent, the colonoscope  ?                          was passed under direct vision. Throughout the  ?                          procedure, the  patient's blood pressure, pulse, and  ?                          oxygen saturations were monitored continuously. The  ?                          [id number] ([id number]) was introduced through the  ?                          anus and advanced to the the cecum, identified by  ?                          appendiceal orifice and ileocecal valve. The  ?                          colonoscopy was performed without difficulty. The  ?                          patient tolerated the procedure well. The quality  ?                          of the bowel preparation was excellent. The  ?                          ileocecal valve, appendiceal orifice, and rectum  ?                          were photographed. ?Scope In: 9:53:27 AM ?Scope Out: 10:16:50 AM ?Scope Withdrawal Time: 0 hours 15 minutes 13 seconds  ?Total Procedure Duration: 0 hours 23 minutes 23 seconds  ?Findings: ?     The perianal and digital rectal examinations were normal. ?     Two polyps were found in the ascending colon and cecum. The polyps were  ?     small in size. These polyps were removed with a cold snare. Resection  ?     and retrieval were complete. The pathology specimen was placed into  ?     Bottle Number 1. ?     A few diverticula were found in the sigmoid colon. ?     The retroflexed view of the  distal rectum and anal verge was normal and  ?     showed no anal or rectal abnormalities. ?Impression:               - Two small polyps in the ascending colon and in  ?                          the cecum, removed with a cold snare. Resected and  ?                          retrieved. ?                          - Diverticulosis in the sigmoid colon. ?Moderate Sedation: ?     Per Anesthesia Care ?Recommendation:           - Patient has a contact number available for  ?                          emergencies. The signs and symptoms of potential  ?                          delayed complications were discussed with the  ?                          patient. Return to normal  activities tomorrow.  ?                          Written discharge instructions were provided to the  ?                          patient. ?                          - High fiber diet today. ?                          - Continue present medications. ?                          - No aspirin, ibuprofen, naproxen, or other  ?                          non-steroidal anti-inflammatory drugs for 1 day. ?                          - Await pathology results. ?                          - Repeat colonoscopy in 5 years for surveillance. ?Procedure Code(s):        --- Professional --- ?                          [phone], Colonoscopy, flexible; with removal of  ?                          tumor(s), polyp(s), or other lesion(s)  by snare  ?                          technique ?Diagnosis Code(s):        --- Professional --- ?                          Z86.010, Personal history of colonic polyps ?                          K63.5, Polyp of colon ?                          K57.30, Diverticulosis of large intestine without  ?                          perforation or abscess without bleeding ?CPT copyright 2019 American Medical Association. All rights reserved. ?The codes documented in this report are preliminary and upon coder review may  ?be revised to meet current compliance requirements. ?Hildred Laser, MD ?Hildred Laser, MD ?08/08/2021 10:23:57 AM ?This report has been signed electronically. ?Number of Addenda: 0 ?

## 2021-08-08 NOTE — Transfer of Care (Signed)
Immediate Anesthesia Transfer of Care Note ? ?Patient: Rebecca Rodgers ? ?Procedure(s) Performed: COLONOSCOPY WITH PROPOFOL ?POLYPECTOMY INTESTINAL ? ?Patient Location: Short Stay ? ?Anesthesia Type:General ? ?Level of Consciousness: awake ? ?Airway & Oxygen Therapy: Patient Spontanous Breathing ? ?Post-op Assessment: Report given to RN ? ?Post vital signs: Reviewed and stable ? ?Last Vitals:  ?Vitals Value Taken Time  ?BP    ?Temp 36.6 ?C 08/08/21 1023  ?Pulse 76 08/08/21 1023  ?Resp 17 08/08/21 1023  ?SpO2 100 % 08/08/21 1023  ? ? ?Last Pain:  ?Vitals:  ? 08/08/21 1023  ?TempSrc: Oral  ?PainSc: 0-No pain  ?   ? ?  ? ?Complications: No notable events documented. ?

## 2021-08-09 ENCOUNTER — Other Ambulatory Visit: Payer: Self-pay | Admitting: Family Medicine

## 2021-08-09 LAB — SURGICAL PATHOLOGY

## 2021-08-14 ENCOUNTER — Encounter (HOSPITAL_COMMUNITY): Payer: Self-pay | Admitting: Internal Medicine

## 2021-08-27 ENCOUNTER — Ambulatory Visit (INDEPENDENT_AMBULATORY_CARE_PROVIDER_SITE_OTHER): Payer: Medicare HMO

## 2021-08-27 DIAGNOSIS — Z0000 Encounter for general adult medical examination without abnormal findings: Secondary | ICD-10-CM | POA: Diagnosis not present

## 2021-08-27 DIAGNOSIS — Z87891 Personal history of nicotine dependence: Secondary | ICD-10-CM

## 2021-08-27 NOTE — Patient Instructions (Signed)
  Rebecca Rodgers , Thank you for taking time to come for your Medicare Wellness Visit. I appreciate your ongoing commitment to your health goals. Please review the following plan we discussed and let me know if I can assist you in the future.   These are the goals we discussed:  Goals      decrease carbohydrates     Recommend decreasing simple carbohydrates.      Exercise 150 min/wk Moderate Activity     I plan to go to the gym      LIFESTYLE - DECREASE FALLS RISK        This is a list of the screening recommended for you and due dates:  Health Maintenance  Topic Date Due   DEXA scan (bone density measurement)  Never done   Flu Shot  10/23/2021   Mammogram  12/20/2021   Pap Smear  12/21/2023   Colon Cancer Screening  08/09/2026   Tetanus Vaccine  09/18/2026   Pneumonia Vaccine  Completed   COVID-19 Vaccine  Completed   Hepatitis C Screening: USPSTF Recommendation to screen - Ages 18-79 yo.  Completed   HIV Screening  Completed   Zoster (Shingles) Vaccine  Completed   HPV Vaccine  Aged Out

## 2021-08-27 NOTE — Progress Notes (Signed)
Subjective:   Rebecca Rodgers is a 66 y.o. female who presents for Medicare Annual (Subsequent) preventive examination. I connected with  Rebecca Rodgers on 08/27/21 by a audio enabled telemedicine application and verified that I am speaking with the correct person using two identifiers.  Patient Location: Home  Provider Location: Office/Clinic  I discussed the limitations of evaluation and management by telemedicine. The patient expressed understanding and agreed to proceed.  Review of Systems           Objective:    There were no vitals filed for this visit. There is no height or weight on file to calculate BMI.     08/08/2021    7:50 AM 08/06/2021    1:33 PM 08/23/2020   11:14 AM 01/26/2018    1:13 PM 07/23/2017   10:37 AM 06/27/2016    9:33 AM 05/21/2016    9:00 AM  Advanced Directives  Does Patient Have a Medical Advance Directive? No No No Yes Yes No No  Does patient want to make changes to medical advance directive?   No - Patient declined  No - Patient declined    Would patient like information on creating a medical advance directive? No - Patient declined No - Patient declined     Yes (MAU/Ambulatory/Procedural Areas - Information given)    Current Medications (verified) Outpatient Encounter Medications as of 08/27/2021  Medication Sig   acetaminophen (TYLENOL) 500 MG tablet Take 500 mg by mouth every 6 (six) hours as needed (pain).   albuterol (VENTOLIN HFA) 108 (90 Base) MCG/ACT inhaler INHALE 2 PUFFS INTO THE LUNGS EVERY 6 (SIX) HOURS AS NEEDED FOR WHEEZING OR SHORTNESS OF BREATH.   amLODipine (NORVASC) 10 MG tablet TAKE 1 TABLET EVERY DAY   budesonide-formoterol (SYMBICORT) 160-4.5 MCG/ACT inhaler INHALE 2 PUFFS TWICE DAILY (Patient taking differently: 2 puffs at bedtime.)   buPROPion (WELLBUTRIN XL) 150 MG 24 hr tablet Take 1 tablet (150 mg total) by mouth daily.   Cholecalciferol (VITAMIN D) 125 MCG (5000 UT) CAPS Take 5,000 Units by mouth every Tuesday.    clotrimazole-betamethasone (LOTRISONE) cream APPLY TO AFFECTED AREAS TWICE A DAY (Patient taking differently: 1 application. daily as needed (Rash).)   ezetimibe (ZETIA) 10 MG tablet TAKE 1 TABLET EVERY DAY (Patient taking differently: Take 10 mg by mouth at bedtime.)   lamoTRIgine (LAMICTAL) 200 MG tablet Take 1 tablet (200 mg total) by mouth daily.   levothyroxine (SYNTHROID) 50 MCG tablet TAKE 1 TABLET DAILY MONDAY THROUGH SATURDAY AND TAKE 1 AND 1/2 TABLETS ON SUNDAY (Patient taking differently: Take 50-75 mcg by mouth daily before breakfast. TAKE 50 mg  TABLET DAILY MONDAY THROUGH SATURDAY AND TAKE 75 mg TABLETS ON SUNDAY)   montelukast (SINGULAIR) 10 MG tablet TAKE 1 TABLET AT BEDTIME   OLANZapine (ZYPREXA) 5 MG tablet Take 1 tablet (5 mg total) by mouth at bedtime.   omeprazole (PRILOSEC) 20 MG capsule TAKE 1 CAPSULE EVERY DAY   rosuvastatin (CRESTOR) 40 MG tablet TAKE 1 TABLET EVERY DAY (Patient taking differently: Take 40 mg by mouth at bedtime.)   traZODone (DESYREL) 100 MG tablet TAKE 1 TO 2 TABLETS (100 TO 200 MG TOTAL) BY MOUTH AT BEDTIME AS NEEDED FOR SLEEP. (Patient taking differently: Take 200 mg by mouth at bedtime.)   triamterene-hydrochlorothiazide (MAXZIDE-25) 37.5-25 MG tablet TAKE 1 TABLET EVERY DAY   vitamin C (ASCORBIC ACID) 500 MG tablet Take 500 mg by mouth daily.   VOLTAREN 1 % GEL APPLY TO THE AFFECTED  AREA(S) TWICE A DAY AS NEEDED FOR PAIN (Patient taking differently: 2 g daily as needed (pain).)   No facility-administered encounter medications on file as of 08/27/2021.    Allergies (verified) Codeine and Penicillins   History: Past Medical History:  Diagnosis Date   Anxiety    Asthma    Depression    Hx of hysterectomy    Hyperlipidemia    Hypertension    Hypothyroidism    Suicide attempt ([place]) 1997   when a relationship broke up     Suicide attempt ([place])    3 months ago because of ill health of her parents    Uterine cancer ([place]) 2005   Past Surgical  History:  Procedure Laterality Date   [place]   bilateral     COLONOSCOPY  12/20/2010   Procedure: COLONOSCOPY;  Surgeon: Rogene Houston, MD;  [facility];  Service: Endoscopy;  Laterality: N/A;   COLONOSCOPY N/A 06/27/2016   Procedure: COLONOSCOPY;  Surgeon: Rogene Houston, MD;  [facility];  Service: Endoscopy;  Laterality: N/A;  1030   COLONOSCOPY WITH PROPOFOL N/A 08/08/2021   Procedure: COLONOSCOPY WITH PROPOFOL;  Surgeon: Rogene Houston, MD;  [facility]: [facility];  Service: Endoscopy;  Laterality: N/A;  [place]     at 20    POLYPECTOMY  08/08/2021   Procedure: POLYPECTOMY INTESTINAL;  Surgeon: Rogene Houston, MD;  [facility]: [facility];  Service: Endoscopy;;   VESICOVAGINAL FISTULA CLOSURE W/ TAH  2002   dur to uterine cancer    Family History  Problem Relation Age of Onset   Cataracts Mother    Hypertension Mother    Hyperlipidemia Mother    Dementia Mother    Depression Mother    Alzheimer's disease Mother    Coronary artery disease Father    Hyperlipidemia Father    Stroke Father    CVA Father    Cancer Sister 8       breast    Cancer Brother 76       prostate   Social History   Socioeconomic History   Marital status: Single    Spouse name: Not on file   Number of children: Not on file   Years of education: Not on file   Highest education level: Not on file  Occupational History   Occupation: disabled  Tobacco Use   Smoking status: Former    Packs/day: 0.25    Years: 15.00    Pack years: 3.75    Types: Cigarettes    Quit date: 03/09/2013    Years since quitting: 8.4   Smokeless tobacco: Former  Scientific laboratory technician Use: Former  Substance and Sexual Activity   Alcohol use: No    Alcohol/week: 0.0 standard drinks   Drug use: No   Sexual activity: Not Currently    Birth control/protection: Other-see comments, Surgical  Other Topics Concern    Not on file  Social History Narrative   Partner passed in the summer of 2018. This has been very difficult on her.    Social Determinants of Health   Financial Resource Strain: Not on file  Food Insecurity: Not on file  Transportation Needs: Not on file  Physical Activity: Not on file  Stress: Not on file  Social Connections: Not on file    Tobacco Counseling Counseling given: Not Answered   Clinical  Intake:                 Diabetic?no         Activities of Daily Living    08/06/2021    1:36 PM 08/06/2021    1:33 PM  In your present state of health, do you have any difficulty performing the following activities:  Hearing?  0  Vision?  0  Difficulty concentrating or making decisions?  0  Walking or climbing stairs?  0  Dressing or bathing?  0  Doing errands, shopping? 0     Patient Care Team: Fayrene Helper, MD as PCP - General Harrington Challenger Su Ley, MD as Consulting Physician [facility])  Indicate any recent Medical Services you may have received from other than [facility] providers in the past year (date may be approximate).     Assessment:   This is a routine wellness examination for Breya.  Hearing/Vision screen No results found.  Dietary issues and exercise activities discussed:     Goals Addressed   None    Depression Screen    06/06/2021    1:12 PM 12/20/2020    2:15 PM 08/23/2020   11:18 AM 12/15/2019    8:35 PM 08/02/2019    1:32 PM 12/09/2018    1:00 PM 07/28/2018    2:43 PM  PHQ 2/9 Scores  PHQ - 2 Score 0 0 0 0 0 0 0  PHQ- 9 Score     0      Fall Risk    06/06/2021    1:12 PM 12/20/2020    2:37 PM 12/20/2020    2:14 PM 08/23/2020   11:17 AM 08/02/2019    1:23 PM  [place] in the past year? '1 1 1 '$ 0 0  Number falls in past yr: 0 1 0 0 0  Injury with Fall? 0 0 0 0 0  Risk for fall due to : No Fall Risks   No Fall Risks   Follow up Falls evaluation completed   Falls evaluation completed;Falls prevention discussed      FALL RISK PREVENTION PERTAINING TO THE HOME:  Any stairs in or around the home? No  If so, are there any without handrails?    Home free of loose throw rugs in walkways, pet beds, electrical cords, etc? Yes  Adequate lighting in your home to reduce risk of falls? Yes   ASSISTIVE DEVICES UTILIZED TO PREVENT FALLS:  Life alert? No  Use of a cane, walker or w/c? No  Grab bars in the bathroom? Yes  Shower chair or bench in shower? No  Elevated toilet seat or a handicapped toilet? No   TIMED UP AND GO:  Was the test performed? No .  Length of time to ambulate 10 feet:  sec.     Cognitive Function:        08/23/2020   11:22 AM 08/02/2019    1:24 PM 07/28/2018    2:44 PM 07/23/2017   10:40 AM 05/21/2016    9:12 AM  6CIT Screen  What Year?  0 points 0 points 0 points 0 points  What month?  0 points 0 points 0 points 0 points  What time? 0 points 0 points 0 points 0 points 0 points  Count back from 20 0 points 0 points 0 points 0 points 0 points  Months in reverse 0 points 0 points 0 points 0 points 0 points  Repeat phrase 4 points 2 points 2  points 0 points 0 points  Total Score  2 points 2 points 0 points 0 points    Immunizations Immunization History  Administered Date(s) Administered   Influenza Split 01/08/2005, 01/08/2012, 12/08/2013, 01/06/2015   Influenza Whole 12/29/2008, 11/28/2010   Influenza,inj,Quad PF,6+ Mos 01/15/2013, 02/20/2016, 02/19/2017, 02/23/2018, 12/09/2018, 12/15/2019, 12/20/2020   Influenza-Unspecified 12/08/2013   Moderna Covid-19 Vaccine Bivalent Booster 62yr & up 08/14/2021   Moderna Sars-Covid-2 Vaccination 06/05/2019, 07/21/2019, 03/07/2020   PNEUMOCOCCAL CONJUGATE-20 06/06/2021   Pneumococcal Conjugate-13 10/26/2013   Tdap 03/26/2010, 09/17/2016   Zoster Recombinat (Shingrix) 11/05/2016, 02/25/2017    TDAP status: Up to date  Flu Vaccine status: Up to date  Pneumococcal vaccine status: Up to date  Covid-19 vaccine status: Completed  vaccines  Qualifies for Shingles Vaccine? Yes   Zostavax completed No   Shingrix Completed?: Yes  Screening Tests Health Maintenance  Topic Date Due   DEXA SCAN  Never done   INFLUENZA VACCINE  10/23/2021   MAMMOGRAM  12/20/2021   PAP SMEAR-Modifier  12/21/2023   COLONOSCOPY (Pts [phone] coverage will need to be confirmed)  08/09/2026   TETANUS/TDAP  09/18/2026   Pneumonia Vaccine 66Years old  Completed   COVID-19 Vaccine  Completed   Hepatitis C Screening  Completed   HIV Screening  Completed   Zoster Vaccines- Shingrix  Completed   HPV VACCINES  Aged Out    Health Maintenance  Health Maintenance Due  Topic Date Due   DEXA SCAN  Never done    Colorectal cancer screening: Type of screening: Colonoscopy. Completed 08/08/21. Repeat every 10 years  Mammogram status: Completed 12/20/20. Repeat every year  Bone Density status: Ordered 12/24/2021. Pt provided with contact info and advised to call to schedule appt.  Lung Cancer Screening: (Low Dose CT Chest recommended if Age 66-80ears, 30 pack-year currently smoking OR have quit w/in 15years.) does qualify.   Lung Cancer Screening Referral: yes 08/27/2021  Additional Screening:  Hepatitis C Screening: does not qualify; Completed 11/29/2014  Vision Screening: Recommended annual ophthalmology exams for early detection of glaucoma and other disorders of the eye. Is the patient up to date with their annual eye exam?  Yes  Who is the provider or what is the name of the office in which the patient attends annual eye exams? [facility] If pt is not established with a provider, would they like to be referred to a provider to establish care? No .   Dental Screening: Recommended annual dental exams for proper oral hygiene  Community Resource Referral / Chronic Care Management: CRR required this visit?  No   CCM required this visit?  No      Plan:     I have personally reviewed and noted the following in the  patient's chart:   Medical and social history Use of alcohol, tobacco or illicit drugs  Current medications and supplements including opioid prescriptions.  Functional ability and status Nutritional status Physical activity Advanced directives List of other physicians Hospitalizations, surgeries, and ER visits in previous 12 months Vitals Screenings to include cognitive, depression, and falls Referrals and appointments  In addition, I have reviewed and discussed with patient certain preventive protocols, quality metrics, and best practice recommendations. A written personalized care plan for preventive services as well as general preventive health recommendations were provided to patient.     JoJill SideCMLong Barn 08/27/2021   Nurse Notes:

## 2021-09-10 DIAGNOSIS — E7800 Pure hypercholesterolemia, unspecified: Secondary | ICD-10-CM | POA: Diagnosis not present

## 2021-09-10 DIAGNOSIS — H5200 Hypermetropia, unspecified eye: Secondary | ICD-10-CM | POA: Diagnosis not present

## 2021-09-10 DIAGNOSIS — H35363 Drusen (degenerative) of macula, bilateral: Secondary | ICD-10-CM | POA: Diagnosis not present

## 2021-09-10 DIAGNOSIS — Z0100 Encounter for examination of eyes and vision without abnormal findings: Secondary | ICD-10-CM | POA: Diagnosis not present

## 2021-11-04 ENCOUNTER — Other Ambulatory Visit: Payer: Self-pay | Admitting: Family Medicine

## 2021-11-25 ENCOUNTER — Other Ambulatory Visit: Payer: Self-pay | Admitting: Family Medicine

## 2021-12-06 ENCOUNTER — Other Ambulatory Visit: Payer: Self-pay | Admitting: Physician Assistant

## 2021-12-06 DIAGNOSIS — F3178 Bipolar disorder, in full remission, most recent episode mixed: Secondary | ICD-10-CM

## 2021-12-11 ENCOUNTER — Telehealth: Payer: Self-pay | Admitting: Physician Assistant

## 2021-12-11 NOTE — Telephone Encounter (Signed)
Next visit is 01/16/22. Rebecca Rodgers is requesting a refill on her Bupropion 150 mg called to:  [place], [place]  Phone:  [phone]  Fax:  [phone]

## 2021-12-11 NOTE — Telephone Encounter (Signed)
Rx was sent 9/18, patient notified.

## 2021-12-21 ENCOUNTER — Ambulatory Visit (INDEPENDENT_AMBULATORY_CARE_PROVIDER_SITE_OTHER): Payer: Medicare HMO | Admitting: Family Medicine

## 2021-12-21 ENCOUNTER — Encounter: Payer: Self-pay | Admitting: Family Medicine

## 2021-12-21 VITALS — BP 119/78 | HR 78 | Ht 63.0 in | Wt 175.0 lb

## 2021-12-21 DIAGNOSIS — J45991 Cough variant asthma: Secondary | ICD-10-CM

## 2021-12-21 DIAGNOSIS — Z23 Encounter for immunization: Secondary | ICD-10-CM

## 2021-12-21 DIAGNOSIS — R7302 Impaired glucose tolerance (oral): Secondary | ICD-10-CM | POA: Diagnosis not present

## 2021-12-21 DIAGNOSIS — R053 Chronic cough: Secondary | ICD-10-CM | POA: Diagnosis not present

## 2021-12-21 DIAGNOSIS — E785 Hyperlipidemia, unspecified: Secondary | ICD-10-CM

## 2021-12-21 DIAGNOSIS — Z0001 Encounter for general adult medical examination with abnormal findings: Secondary | ICD-10-CM | POA: Diagnosis not present

## 2021-12-21 DIAGNOSIS — E038 Other specified hypothyroidism: Secondary | ICD-10-CM | POA: Diagnosis not present

## 2021-12-21 DIAGNOSIS — I10 Essential (primary) hypertension: Secondary | ICD-10-CM | POA: Diagnosis not present

## 2021-12-21 DIAGNOSIS — E559 Vitamin D deficiency, unspecified: Secondary | ICD-10-CM | POA: Diagnosis not present

## 2021-12-21 DIAGNOSIS — M79642 Pain in left hand: Secondary | ICD-10-CM

## 2021-12-21 MED ORDER — BENZONATATE 100 MG PO CAPS: 100.0000 mg | ORAL_CAPSULE | Freq: Two times a day (BID) | ORAL | 0 refills | Status: DC | PRN

## 2021-12-21 MED ORDER — AZITHROMYCIN 250 MG PO TABS: ORAL_TABLET | ORAL | 0 refills | Status: AC

## 2021-12-21 NOTE — Patient Instructions (Addendum)
F/U in 6 months, call if you need me sooner  Flu vaccine today  cBC, lipid, cmp and EGgFR, TSH and vit D and HBA1C   today  cXR and x ray of left hand today  Z pack and tesslon perles are prescribed  You arre being referred to Lung Specialist due to chronic cough  It is important that you exercise regularly at least 30 minutes 5 times a week. If you develop chest pain, have severe difficulty breathing, or feel very tired, stop exercising immediately and seek medical attention   Thanks for choosing [facility], we consider it a privelige to serve you.

## 2021-12-21 NOTE — Progress Notes (Unsigned)
    Rebecca Rodgers     MRN: [id number]      DOB: [birth year]  HPI: Patient is in for annual physical exam. No other health concerns are expressed or addressed at the visit. Recent labs,  are reviewed. Immunization is reviewed , and  updated if needed.   PE: Pleasant  female, alert and oriented x 3, in no cardio-pulmonary distress. Afebrile. HEENT No facial trauma or asymetry. Sinuses non tender.  Extra occullar muscles intact.. External ears normal, . Neck: supple, no adenopathy,JVD or thyromegaly.No bruits.  Chest: Clear to ascultation bilaterally.No crackles or wheezes. Non tender to palpation  Breast: No asymetry,no masses or lumps. No tenderness. No nipple discharge or inversion. No axillary or supraclavicular adenopathy  Cardiovascular system; Heart sounds normal,  S1 and  S2 ,no S3.  No murmur, or thrill. Apical beat not displaced Peripheral pulses normal.  Abdomen: Soft, non tender, no organomegaly or masses. No bruits. Bowel sounds normal. No guarding, tenderness or rebound.   GU: External genitalia normal female genitalia , normal female distribution of hair. No lesions. Urethral meatus normal in size, no  Prolapse, no lesions visibly  Present. Bladder non tender. Vagina pink and moist , with no visible lesions , discharge present . Adequate pelvic support no  cystocele or rectocele noted Cervix pink and appears healthy, no lesions or ulcerations noted, no discharge noted from os Uterus normal size, no adnexal masses, no cervical motion or adnexal tenderness.   Musculoskeletal exam: Full ROM of spine, hips , shoulders and knees. No deformity ,swelling or crepitus noted. No muscle wasting or atrophy.   Neurologic: Cranial nerves 2 to 12 intact. Power, tone ,sensation and reflexes normal throughout. No disturbance in gait. No tremor.  Skin: Intact, no ulceration, erythema , scaling or rash noted. Pigmentation normal throughout  Psych; Normal mood  and affect. Judgement and concentration normal   Assessment & Plan:  No problem-specific Assessment & Plan notes found for this encounter.

## 2021-12-23 ENCOUNTER — Encounter: Payer: Self-pay | Admitting: Family Medicine

## 2021-12-23 DIAGNOSIS — M79642 Pain in left hand: Secondary | ICD-10-CM | POA: Insufficient documentation

## 2021-12-23 DIAGNOSIS — Z0001 Encounter for general adult medical examination with abnormal findings: Secondary | ICD-10-CM | POA: Insufficient documentation

## 2021-12-23 DIAGNOSIS — R053 Chronic cough: Secondary | ICD-10-CM | POA: Insufficient documentation

## 2021-12-23 DIAGNOSIS — Z0000 Encounter for general adult medical examination without abnormal findings: Secondary | ICD-10-CM | POA: Insufficient documentation

## 2021-12-23 LAB — CMP14+EGFR
ALT: 16 IU/L (ref 0–32)
AST: 21 IU/L (ref 0–40)
Albumin/Globulin Ratio: 1.5 (ref 1.2–2.2)
Albumin: 4.5 g/dL (ref 3.9–4.9)
Alkaline Phosphatase: 72 IU/L (ref 44–121)
BUN/Creatinine Ratio: 16 (ref 12–28)
BUN: 16 mg/dL (ref 8–27)
Bilirubin Total: 0.3 mg/dL (ref 0.0–1.2)
CO2: 22 mmol/L (ref 20–29)
Calcium: 9.6 mg/dL (ref 8.7–10.3)
Chloride: 102 mmol/L (ref 96–106)
Creatinine, Ser: 1.02 mg/dL — ABNORMAL HIGH (ref 0.57–1.00)
Globulin, Total: 3.1 g/dL (ref 1.5–4.5)
Glucose: 103 mg/dL — ABNORMAL HIGH (ref 70–99)
Potassium: 3.7 mmol/L (ref 3.5–5.2)
Sodium: 140 mmol/L (ref 134–144)
Total Protein: 7.6 g/dL (ref 6.0–8.5)
eGFR: 61 mL/min/{1.73_m2} (ref >59)

## 2021-12-23 LAB — HEMOGLOBIN A1C
Est. average glucose Bld gHb Est-mCnc: 117 mg/dL
Hgb A1c MFr Bld: 5.7 % — ABNORMAL HIGH (ref 4.8–5.6)

## 2021-12-23 LAB — LIPID PANEL
Chol/HDL Ratio: 2.4 ratio (ref 0.0–4.4)
Cholesterol, Total: 173 mg/dL (ref 100–199)
HDL: 72 mg/dL (ref >39)
LDL Chol Calc (NIH): 86 mg/dL (ref 0–99)
Triglycerides: 83 mg/dL (ref 0–149)
VLDL Cholesterol Cal: 15 mg/dL (ref 5–40)

## 2021-12-23 LAB — CBC
Hematocrit: 41.3 % (ref 34.0–46.6)
Hemoglobin: 13.3 g/dL (ref 11.1–15.9)
MCH: 29.8 pg (ref 26.6–33.0)
MCHC: 32.2 g/dL (ref 31.5–35.7)
MCV: 93 fL (ref 79–97)
Platelets: 352 10*3/uL (ref 150–450)
RBC: 4.46 x10E6/uL (ref 3.77–5.28)
RDW: 14.5 % (ref 11.7–15.4)
WBC: 6.5 10*3/uL (ref 3.4–10.8)

## 2021-12-23 LAB — VITAMIN D 25 HYDROXY (VIT D DEFICIENCY, FRACTURES): Vit D, 25-Hydroxy: 48.9 ng/mL (ref 30.0–100.0)

## 2021-12-23 NOTE — Assessment & Plan Note (Signed)
Fall on Sept 1, persistent pain and swelling, needs X ray

## 2021-12-23 NOTE — Assessment & Plan Note (Signed)
Uncontrolled, refer Pulmonary

## 2021-12-23 NOTE — Assessment & Plan Note (Signed)

## 2021-12-23 NOTE — Assessment & Plan Note (Signed)
CXR, Z pack and tesslon perles, likely due to poorly controlled asthma

## 2021-12-24 ENCOUNTER — Other Ambulatory Visit: Payer: Self-pay | Admitting: Physician Assistant

## 2021-12-24 ENCOUNTER — Other Ambulatory Visit (HOSPITAL_COMMUNITY): Payer: Medicare HMO

## 2021-12-24 ENCOUNTER — Ambulatory Visit (HOSPITAL_COMMUNITY): Payer: Medicare HMO

## 2021-12-24 ENCOUNTER — Other Ambulatory Visit: Payer: Self-pay | Admitting: Family Medicine

## 2021-12-24 DIAGNOSIS — E038 Other specified hypothyroidism: Secondary | ICD-10-CM

## 2021-12-24 DIAGNOSIS — F3178 Bipolar disorder, in full remission, most recent episode mixed: Secondary | ICD-10-CM

## 2021-12-25 ENCOUNTER — Encounter: Payer: Medicare HMO | Admitting: Family Medicine

## 2021-12-25 LAB — TSH: TSH: 1.28 u[IU]/mL (ref 0.450–4.500)

## 2021-12-25 LAB — SPECIMEN STATUS REPORT

## 2022-01-02 ENCOUNTER — Ambulatory Visit (HOSPITAL_COMMUNITY)
Admission: RE | Admit: 2022-01-02 | Discharge: 2022-01-02 | Disposition: A | Discharge status: Home / Self Care | Payer: Medicare HMO | Source: Ambulatory Visit | Attending: Family Medicine | Admitting: Family Medicine

## 2022-01-02 DIAGNOSIS — Z780 Asymptomatic menopausal state: Secondary | ICD-10-CM | POA: Insufficient documentation

## 2022-01-02 DIAGNOSIS — Z1231 Encounter for screening mammogram for malignant neoplasm of breast: Secondary | ICD-10-CM | POA: Insufficient documentation

## 2022-01-02 DIAGNOSIS — Z13820 Encounter for screening for osteoporosis: Secondary | ICD-10-CM | POA: Insufficient documentation

## 2022-01-02 DIAGNOSIS — M85852 Other specified disorders of bone density and structure, left thigh: Secondary | ICD-10-CM | POA: Insufficient documentation

## 2022-01-03 ENCOUNTER — Telehealth: Payer: Self-pay | Admitting: Family Medicine

## 2022-01-03 NOTE — Telephone Encounter (Signed)
Patient return call for lab results

## 2022-01-03 NOTE — Telephone Encounter (Signed)
Patient aware of dexa results

## 2022-01-09 ENCOUNTER — Other Ambulatory Visit: Payer: Self-pay | Admitting: Family Medicine

## 2022-01-16 ENCOUNTER — Telehealth: Payer: Medicare HMO | Admitting: Physician Assistant

## 2022-01-16 ENCOUNTER — Encounter: Payer: Self-pay | Admitting: Physician Assistant

## 2022-01-16 DIAGNOSIS — F3178 Bipolar disorder, in full remission, most recent episode mixed: Secondary | ICD-10-CM | POA: Diagnosis not present

## 2022-01-16 DIAGNOSIS — G4700 Insomnia, unspecified: Secondary | ICD-10-CM | POA: Diagnosis not present

## 2022-01-16 NOTE — Progress Notes (Addendum)
[facility]  Patient ID: Rebecca Rodgers,  MRN: [id number]  PCP: Fayrene Helper, MD  Date of Evaluation: 01/16/2022 Time spent:25 minutes  Chief Complaint:  Chief Complaint   Depression; Anxiety; Insomnia; Follow-up   Virtual Visit via Telehealth  I connected with patient by telephone with their informed consent, and verified patient privacy and that I am speaking with the correct person using two identifiers.  I am private, in my office and the patient is at home.  I discussed the limitations, risks, security and privacy concerns of performing an evaluation and management service by telephone and the availability of in person appointments. I also discussed with the patient that there may be a patient responsible charge related to this service. The patient expressed understanding and agreed to proceed.   I discussed the assessment and treatment plan with the patient. The patient was provided an opportunity to ask questions and all were answered. The patient agreed with the plan and demonstrated an understanding of the instructions.   The patient was advised to call back or seek an in-person evaluation if the symptoms worsen or if the condition fails to improve as anticipated.  I provided 25 minutes of non-face-to-face time during this encounter.  HISTORY/CURRENT STATUS: HPI For 6 mo med check.  She is sick right now with a respiratory infection and could not come into the office.  "Other than that, I am fine."  Is able to enjoy things.  Enjoyed a recent trip to [place].  Energy and motivation are good.  No extreme sadness, tearfulness, or feelings of hopelessness.  Sleeps well most of the time. ADLs and personal hygiene are normal.   Denies any changes in concentration, making decisions, or remembering things.  Appetite has not changed.  Weight is stable.  Has anxiety occasionally but not often.  Denies suicidal or homicidal thoughts.  Patient denies increased energy  with decreased need for sleep, increased talkativeness, racing thoughts, impulsivity or risky behaviors, increased spending, increased libido, grandiosity, increased irritability or anger, paranoia, or hallucinations.  Denies dizziness, syncope, seizures, numbness, tingling, tremor, tics, unsteady gait, slurred speech, confusion.  Denies dystonia. Does have bilat knee pain from OA. Denies unexplained weight loss, frequent infections, or sores that heal slowly.  No polyphagia, polydipsia, or polyuria. Denies visual changes or paresthesias.   Individual Medical History/ Review of Systems: Changes? :No     Past medications for mental health diagnoses include: Lamictal, Trazodone, Zyprexa, Wellbutrin, others she can't remember.   Allergies: Codeine and Penicillins  Current Medications:  Current Outpatient Medications:    acetaminophen (TYLENOL) 500 MG tablet, Take 500 mg by mouth every 6 (six) hours as needed (pain)., Disp: , Rfl:    albuterol (VENTOLIN HFA) 108 (90 Base) MCG/ACT inhaler, INHALE 2 PUFFS INTO THE LUNGS EVERY 6 (SIX) HOURS AS NEEDED FOR WHEEZING OR SHORTNESS OF BREATH., Disp: 1 each, Rfl: 0   amLODipine (NORVASC) 10 MG tablet, TAKE 1 TABLET EVERY DAY, Disp: 90 tablet, Rfl: 1   benzonatate (TESSALON) 100 MG capsule, Take 1 capsule (100 mg total) by mouth 2 (two) times daily as needed for cough., Disp: 20 capsule, Rfl: 0   budesonide-formoterol (SYMBICORT) 160-4.5 MCG/ACT inhaler, INHALE 2 PUFFS TWICE DAILY, Disp: 3 each, Rfl: 10   buPROPion (WELLBUTRIN XL) 150 MG 24 hr tablet, TAKE 1 TABLET EVERY DAY, Disp: 90 tablet, Rfl: 1   Cholecalciferol (VITAMIN D) 125 MCG (5000 UT) CAPS, Take 5,000 Units by mouth every Tuesday., Disp: , Rfl:  clotrimazole-betamethasone (LOTRISONE) cream, APPLY TO AFFECTED AREAS TWICE A DAY (Patient taking differently: 1 application  daily as needed (Rash).), Disp: 45 g, Rfl: 0   DM-APAP-CPM (CORICIDIN HBP PO), Take by mouth. Once a day, Disp: , Rfl:     ezetimibe (ZETIA) 10 MG tablet, TAKE 1 TABLET EVERY DAY, Disp: 90 tablet, Rfl: 0   lamoTRIgine (LAMICTAL) 200 MG tablet, TAKE 1 TABLET EVERY DAY, Disp: 90 tablet, Rfl: 0   levothyroxine (SYNTHROID) 50 MCG tablet, TAKE 1 TABLET DAILY MONDAY THROUGH SATURDAY AND TAKE 1 AND 1/2 TABLETS ON SUNDAY, Disp: 97 tablet, Rfl: 0   montelukast (SINGULAIR) 10 MG tablet, TAKE 1 TABLET AT BEDTIME, Disp: 90 tablet, Rfl: 1   OLANZapine (ZYPREXA) 5 MG tablet, TAKE 1 TABLET AT BEDTIME, Disp: 90 tablet, Rfl: 0   omeprazole (PRILOSEC) 20 MG capsule, TAKE 1 CAPSULE EVERY DAY, Disp: 90 capsule, Rfl: 1   rosuvastatin (CRESTOR) 40 MG tablet, TAKE 1 TABLET EVERY DAY, Disp: 90 tablet, Rfl: 0   traZODone (DESYREL) 100 MG tablet, TAKE 1 TO 2 TABLETS (100 TO 200 MG TOTAL) BY MOUTH AT BEDTIME AS NEEDED FOR SLEEP. (Patient taking differently: Take 200 mg by mouth at bedtime.), Disp: 180 tablet, Rfl: 1   triamterene-hydrochlorothiazide (MAXZIDE-25) 37.5-25 MG tablet, TAKE 1 TABLET EVERY DAY, Disp: 90 tablet, Rfl: 0   vitamin C (ASCORBIC ACID) 500 MG tablet, Take 500 mg by mouth daily., Disp: , Rfl:    VOLTAREN 1 % GEL, APPLY TO THE AFFECTED AREA(S) TWICE A DAY AS NEEDED FOR PAIN (Patient taking differently: 2 g daily as needed (pain).), Disp: 100 g, Rfl: 0 Medication Side Effects: none  Family Medical/ Social History: Changes? No  MENTAL HEALTH EXAM:  There were no vitals taken for this visit.There is no height or weight on file to calculate BMI.  General Appearance:  Unable to assess  Eye Contact:   Unable to assess  Speech:  Clear and Coherent and Normal Rate  Volume:  Normal  Mood:  Euthymic  Affect:   Unable to assess  Thought Process:  Goal Directed and Descriptions of Associations: Circumstantial  Orientation:  Full (Time, Place, and Person)  Thought Content: Logical   Suicidal Thoughts:  No  Homicidal Thoughts:  No  Memory:  WNL  Judgement:  Good  Insight:  Good  Psychomotor Activity:   Unable to assess   Concentration:  Concentration: Good  Recall:  Good  Fund of Knowledge: Good  Language: Good  Assets:  Desire for Improvement  ADL's:  Intact  Cognition: WNL  Prognosis:  Good   Labs 12/21/2021 CBC nl CMP Glu 103 o/w nl Lipids all nl Hgb A1C 5.7 TSH 1.2 Vitamin D 48.9  DIAGNOSES:    ICD-10-CM   1. Bipolar disorder, in full remission, most recent episode mixed ([place])  F31.78     2. Insomnia, unspecified type  G47.00       Receiving Psychotherapy: No   RECOMMENDATIONS:  PDMP was reviewed.  No results available. I provided 25 minutes of non-face-to-face time during this encounter, including time spent before and after the visit in records review, medical decision making, counseling pertinent to today's visit, and charting.   She is doing well as far as her mental health goes so no change in treatment needed.  Continue Wellbutrin XL 150 mg, 1 p.o. daily. Continue Lamictal 200 mg, 1 p.o. daily. Continue Zyprexa 5 mg, 1 p.o. nightly. Continue trazodone 100 mg, 1-2 p.o. nightly as needed sleep.   Return  in 6 months.  Donnal Moat, PA-C

## 2022-02-08 ENCOUNTER — Other Ambulatory Visit: Payer: Self-pay | Admitting: Physician Assistant

## 2022-02-08 DIAGNOSIS — F3178 Bipolar disorder, in full remission, most recent episode mixed: Secondary | ICD-10-CM

## 2022-02-11 ENCOUNTER — Institutional Professional Consult (permissible substitution): Payer: Medicare HMO | Admitting: Internal Medicine

## 2022-02-11 NOTE — Progress Notes (Deleted)
Rebecca Rodgers, female    DOB: October 07, 1955    MRN: [id number]   Brief patient profile:  ***  yo*** *** referred to pulmonary clinic in [place]  02/11/2022 by *** for ***      History of Present Illness  02/11/2022  Pulmonary/ 1st office eval/ Melvyn Novas / [place] [facility]  No chief complaint on file.    Dyspnea:  *** Cough: *** Sleep: *** SABA use: *** 02: *** Lung cancer screen: ***  Past Medical History:  Diagnosis Date   Anxiety    Asthma    Depression    Hx of hysterectomy    Hyperlipidemia    Hypertension    Hypothyroidism    Suicide attempt ([place]) 1997   when a relationship broke up     Suicide attempt ([place])    3 months ago because of ill health of her parents    Uterine cancer ([place]) 2005    Outpatient Medications Prior to Visit  Medication Sig Dispense Refill   acetaminophen (TYLENOL) 500 MG tablet Take 500 mg by mouth every 6 (six) hours as needed (pain).     albuterol (VENTOLIN HFA) 108 (90 Base) MCG/ACT inhaler INHALE 2 PUFFS INTO THE LUNGS EVERY 6 (SIX) HOURS AS NEEDED FOR WHEEZING OR SHORTNESS OF BREATH. 1 each 0   amLODipine (NORVASC) 10 MG tablet TAKE 1 TABLET EVERY DAY 90 tablet 1   benzonatate (TESSALON) 100 MG capsule Take 1 capsule (100 mg total) by mouth 2 (two) times daily as needed for cough. 20 capsule 0   budesonide-formoterol (SYMBICORT) 160-4.5 MCG/ACT inhaler INHALE 2 PUFFS TWICE DAILY 3 each 10   buPROPion (WELLBUTRIN XL) 150 MG 24 hr tablet TAKE 1 TABLET EVERY DAY 90 tablet 1   Cholecalciferol (VITAMIN D) 125 MCG (5000 UT) CAPS Take 5,000 Units by mouth every Tuesday.     clotrimazole-betamethasone (LOTRISONE) cream APPLY TO AFFECTED AREAS TWICE A DAY (Patient taking differently: 1 application  daily as needed (Rash).) 45 g 0   DM-APAP-CPM (CORICIDIN HBP PO) Take by mouth. Once a day     ezetimibe (ZETIA) 10 MG tablet TAKE 1 TABLET EVERY DAY 90 tablet 0   lamoTRIgine (LAMICTAL) 200 MG tablet TAKE 1 TABLET EVERY DAY 90 tablet 0    levothyroxine (SYNTHROID) 50 MCG tablet TAKE 1 TABLET DAILY MONDAY THROUGH SATURDAY AND TAKE 1 AND 1/2 TABLETS ON SUNDAY 97 tablet 0   montelukast (SINGULAIR) 10 MG tablet TAKE 1 TABLET AT BEDTIME 90 tablet 1   OLANZapine (ZYPREXA) 5 MG tablet TAKE 1 TABLET AT BEDTIME 90 tablet 0   omeprazole (PRILOSEC) 20 MG capsule TAKE 1 CAPSULE EVERY DAY 90 capsule 1   rosuvastatin (CRESTOR) 40 MG tablet TAKE 1 TABLET EVERY DAY 90 tablet 0   traZODone (DESYREL) 100 MG tablet TAKE 1 TO 2 TABLETS (100 TO 200 MG TOTAL) AT BEDTIME AS NEEDED FOR SLEEP. 180 tablet 1   triamterene-hydrochlorothiazide (MAXZIDE-25) 37.5-25 MG tablet TAKE 1 TABLET EVERY DAY 90 tablet 0   vitamin C (ASCORBIC ACID) 500 MG tablet Take 500 mg by mouth daily.     VOLTAREN 1 % GEL APPLY TO THE AFFECTED AREA(S) TWICE A DAY AS NEEDED FOR PAIN (Patient taking differently: 2 g daily as needed (pain).) 100 g 0   No facility-administered medications prior to visit.     Objective:     There were no vitals taken for this visit.         Assessment   No problem-specific Assessment &  Plan notes found for this encounter.     Christinia Gully, MD 02/11/2022

## 2022-03-26 ENCOUNTER — Encounter: Payer: Self-pay | Admitting: Internal Medicine

## 2022-03-26 ENCOUNTER — Ambulatory Visit: Payer: No Typology Code available for payment source | Admitting: Internal Medicine

## 2022-03-26 VITALS — BP 128/84 | HR 76 | Temp 97.7°F | Ht 63.0 in | Wt 181.4 lb

## 2022-03-26 DIAGNOSIS — J45991 Cough variant asthma: Secondary | ICD-10-CM

## 2022-03-26 MED ORDER — PANTOPRAZOLE SODIUM 40 MG PO TBEC: 40.0000 mg | DELAYED_RELEASE_TABLET | Freq: Every day | ORAL | 2 refills | Status: DC

## 2022-03-26 MED ORDER — FAMOTIDINE 20 MG PO TABS: ORAL_TABLET | ORAL | 11 refills | Status: DC

## 2022-03-26 MED ORDER — BUDESONIDE-FORMOTEROL FUMARATE 80-4.5 MCG/ACT IN AERO: INHALATION_SPRAY | RESPIRATORY_TRACT | 12 refills | Status: DC

## 2022-03-26 NOTE — Assessment & Plan Note (Signed)
Onset  around 2021 in pt on symb 160 for AB  - 03/26/2022 reduced symbort to 80 2bid/ max gerd rx - Allergy screen 03/26/2022 >  Eos 0. /  IgE   - Sinus CT pending- 03/26/2022   - After extensive coaching inhaler device,  effectiveness =    60% from a baseline of < 30%   The most common causes of chronic cough in immunocompetent adults include the following: upper airway cough syndrome (UACS), previously referred to as postnasal drip syndrome (PNDS), which is caused by variety of rhinosinus conditions; (2) asthma; (3) GERD; (4) chronic bronchitis from cigarette smoking or other inhaled environmental irritants; (5) nonasthmatic eosinophilic bronchitis; and (6) bronchiectasis.   These conditions, singly or in combination, have accounted for up to 94% of the causes of chronic cough in prospective studies.   Other conditions have constituted no >6% of the causes in prospective studies These have included bronchogenic carcinoma, chronic interstitial pneumonia, sarcoidosis, left ventricular failure, ACEI-induced cough, and aspiration from a condition associated with pharyngeal dysfunction.    Chronic cough is often simultaneously caused by more than one condition. A single cause has been found from 38 to 82% of the time, multiple causes from 18 to 62%. Multiple caused cough has been the result of three diseases up to 42% of the time.   In her case sounds like she probably had  baseline AB now much worse in setting of CR/ gerd or both   Rec Lower the strength of symbicort to 80 as the high dose may aggravate UACS and note doe is not one of her concerns Suppress cough with mucinex Max gerd RX  Proceed with w/u for allergy and sinus dz as above.  Needs cxr to complete the initial eval/ did not complete it on day of eval          Each maintenance medication was reviewed in detail including emphasizing most importantly the difference between maintenance and prns and under what circumstances the prns are to  be triggered using an action plan format where appropriate.  Total time for H and P, chart review, counseling, reviewing hfa device(s) and generating customized AVS unique to this office visit / same day charting = 45 min with pt new to me

## 2022-03-26 NOTE — Patient Instructions (Addendum)
For cough > Mucinex dm 1200 mg every 12 hours as needed for cough (over the counter)   Change Symbicort to 80 Take 2 puffs first thing in am and then another 2 puffs about 12 hours later and blow symbicort out thru nose   Stop prilosec and start Pantoprazole (protonix) 40 mg   Take  30-60 min before first meal of the day and Pepcid (famotidine)  20 mg after supper until return to office - this is the best way to tell whether stomach acid is contributing to your problem.    GERD (REFLUX)  is an extremely common cause of respiratory symptoms just like yours , many times with no obvious heartburn at all.    It can be treated with medication, but also with lifestyle changes including elevation of the head of your bed (ideally with 6 -8inch blocks under the headboard of your bed),  Smoking cessation, avoidance of late meals, excessive alcohol, and avoid fatty foods, chocolate, peppermint, colas, red wine, and acidic juices such as orange juice.  NO MINT OR MENTHOL PRODUCTS SO NO COUGH DROPS  USE SUGARLESS CANDY INSTEAD (Jolley ranchers or Stover's or Life Savers) or even ice chips will also do - the key is to swallow to prevent all throat clearing. NO OIL BASED VITAMINS - use powdered substitutes.  Avoid fish oil when coughing.   My office will be contacting you by phone for referral to sinus CT   - if you don't hear back from my office within one week please call us back or notify us thru MyChart and we'll address it right away.   Please remember to go to the  x-ray department  @  [facility] for your tests - we will call you with the results when they are available and refer to allergy if indicated    Please schedule a follow up office visit in 6 weeks, call sooner if needed with all medications /inhalers/ solutions in hand so we can verify exactly what you are taking. This includes all medications from all doctors and over the counters

## 2022-03-26 NOTE — Progress Notes (Unsigned)
CHRISTELLA APP, female    DOB: 1955-09-13    MRN: [id number]   Brief patient profile:  67  yo [place] female  quit smoking  2014 and on Symbicort shortly p  arrived in [place] in early 2000s  referred to pulmonary clinic in [place]  03/26/2022 by Dr Moshe Cipro  for cough onset was while living long term rental house in 2021-22.   Has noticed that much better when not in house for a week.   Nose broken "years ago" forgot ENT name/ intermittent epistaxis since    History of Present Illness  03/26/2022  Pulmonary/ 1st office eval/ Deanza Upperman / [place] on Symbicort 160  Chief Complaint  Patient presents with   Consult    Chronic cough ref by PCP   Dyspnea:  no doe  Cough: worse p waking / stirring x 20 min  assoc with slt yellow nasal d/c chronically Sleep: bed is flat/ 2 pillows  SABA use: rarely  02: none  Lung cancer screen: ordered by Dr Moshe Cipro  No obvious day to day or daytime pattern/variability or assoc excess/ purulent sputum or mucus plugs or hemoptysis or cp or chest tightness, subjective wheeze or  hb symptoms on prn prilosec    Also denies any obvious fluctuation of symptoms with weather or environmental changes or other aggravating or alleviating factors except as outlined above   No unusual exposure hx or h/o childhood pna/ asthma or knowledge of premature birth.  Current Allergies, Complete Past Medical History, Past Surgical History, Family History, and Social History were reviewed in [employer] record.  ROS  The following are not active complaints unless bolded Hoarseness, sore throat, dysphagia, dental problems, itching, sneezing,  nasal congestion or discharge of excess mucus or purulent secretions, ear ache,   fever, chills, sweats, unintended wt loss or wt gain, classically pleuritic or exertional cp,  orthopnea pnd or arm/hand swelling  or leg swelling, presyncope, palpitations, abdominal pain, anorexia, nausea, vomiting, diarrhea  or change in  bowel habits or change in bladder habits, change in stools or change in urine, dysuria, hematuria,  rash, arthralgias, visual complaints, headache, numbness, weakness or ataxia or problems with walking or coordination,  change in mood or  memory.  Epistaxis           Past Medical History:  Diagnosis Date   Anxiety    Asthma    Depression    Hx of hysterectomy    Hyperlipidemia    Hypertension    Hypothyroidism    Suicide attempt ([place]) 1997   when a relationship broke up     Suicide attempt ([place])    3 months ago because of ill health of her parents    Uterine cancer ([place]) 2005    Outpatient Medications Prior to Visit  Medication Sig Dispense Refill   acetaminophen (TYLENOL) 500 MG tablet Take 500 mg by mouth every 6 (six) hours as needed (pain).     albuterol (VENTOLIN HFA) 108 (90 Base) MCG/ACT inhaler INHALE 2 PUFFS INTO THE LUNGS EVERY 6 (SIX) HOURS AS NEEDED FOR WHEEZING OR SHORTNESS OF BREATH. 1 each 0   amLODipine (NORVASC) 10 MG tablet TAKE 1 TABLET EVERY DAY 90 tablet 1   buPROPion (WELLBUTRIN XL) 150 MG 24 hr tablet TAKE 1 TABLET EVERY DAY 90 tablet 1   Cholecalciferol (VITAMIN D) 125 MCG (5000 UT) CAPS Take 5,000 Units by mouth every Tuesday.     clotrimazole-betamethasone (LOTRISONE) cream APPLY TO AFFECTED AREAS TWICE  A DAY (Patient taking differently: 1 application  daily as needed (Rash).) 45 g 0   ezetimibe (ZETIA) 10 MG tablet TAKE 1 TABLET EVERY DAY 90 tablet 0   lamoTRIgine (LAMICTAL) 200 MG tablet TAKE 1 TABLET EVERY DAY 90 tablet 0   levothyroxine (SYNTHROID) 50 MCG tablet TAKE 1 TABLET DAILY MONDAY THROUGH SATURDAY AND TAKE 1 AND 1/2 TABLETS ON SUNDAY 97 tablet 0   montelukast (SINGULAIR) 10 MG tablet TAKE 1 TABLET AT BEDTIME 90 tablet 1   OLANZapine (ZYPREXA) 5 MG tablet TAKE 1 TABLET AT BEDTIME 90 tablet 0   rosuvastatin (CRESTOR) 40 MG tablet TAKE 1 TABLET EVERY DAY 90 tablet 0   traZODone (DESYREL) 100 MG tablet TAKE 1 TO 2 TABLETS (100 TO 200 MG TOTAL) AT  BEDTIME AS NEEDED FOR SLEEP. 180 tablet 1   triamterene-hydrochlorothiazide (MAXZIDE-25) 37.5-25 MG tablet TAKE 1 TABLET EVERY DAY 90 tablet 0   vitamin C (ASCORBIC ACID) 500 MG tablet Take 500 mg by mouth daily.     VOLTAREN 1 % GEL APPLY TO THE AFFECTED AREA(S) TWICE A DAY AS NEEDED FOR PAIN (Patient taking differently: 2 g daily as needed (pain).) 100 g 0   benzonatate (TESSALON) 100 MG capsule Take 1 capsule (100 mg total) by mouth 2 (two) times daily as needed for cough. 20 capsule 0   budesonide-formoterol (SYMBICORT) 160-4.5 MCG/ACT inhaler INHALE 2 PUFFS TWICE DAILY 3 each 10   DM-APAP-CPM (CORICIDIN HBP PO) Take by mouth. Once a day     omeprazole (PRILOSEC) 20 MG capsule TAKE 1 CAPSULE EVERY DAY 90 capsule 1   No facility-administered medications prior to visit.     Objective:     BP 128/84   Pulse 76   Temp 97.7 F (36.5 C)   Ht '5\' 3"'$  (1.6 m)   Wt 181 lb 6.4 oz (82.3 kg)   SpO2 98% Comment: room air  BMI 32.13 kg/m   SpO2: 98 % (room air)  Amb bf nad    HEENT : Oropharynx  clear     Nasal turbinates severe turbinate edema with crusting on R > L    NECK :  without  apparent JVD/ palpable Nodes/TM    LUNGS: no acc muscle use,  Nl contour chest which is clear to A and P bilaterally without cough on insp or exp maneuvers   CV:  RRR  no s3 or murmur or increase in P2, and no edema   ABD:  soft and nontender with nl inspiratory excursion in the supine position. No bruits or organomegaly appreciated   MS:  Nl gait/ ext warm without deformities Or obvious joint restrictions  calf tenderness, cyanosis or clubbing    SKIN: warm and dry without lesions    NEURO:  alert, approp, nl sensorium with  no motor or cerebellar deficits apparent.   Cxr 03/26/2022 did not go as requested    Assessment   Cough variant asthma with UACS component Onset  around 2021 in pt on symb 160 for AB  - 03/26/2022 reduced symbort to 80 2bid/ max gerd rx - Allergy screen 03/26/2022 >  Eos 0.  /  IgE   - Sinus CT pending- 03/26/2022   - After extensive coaching inhaler device,  effectiveness =    60% from a baseline of < 30%   The most common causes of chronic cough in immunocompetent adults include the following: upper airway cough syndrome (UACS), previously referred to as postnasal drip syndrome (PNDS), which is  caused by variety of rhinosinus conditions; (2) asthma; (3) GERD; (4) chronic bronchitis from cigarette smoking or other inhaled environmental irritants; (5) nonasthmatic eosinophilic bronchitis; and (6) bronchiectasis.   These conditions, singly or in combination, have accounted for up to 94% of the causes of chronic cough in prospective studies.   Other conditions have constituted no >6% of the causes in prospective studies These have included bronchogenic carcinoma, chronic interstitial pneumonia, sarcoidosis, left ventricular failure, ACEI-induced cough, and aspiration from a condition associated with pharyngeal dysfunction.    Chronic cough is often simultaneously caused by more than one condition. A single cause has been found from 38 to 82% of the time, multiple causes from 18 to 62%. Multiple caused cough has been the result of three diseases up to 42% of the time.   In her case sounds like she probably had  baseline AB now much worse in setting of CR/ gerd or both   Rec Lower the strength of symbicort to 80 as the high dose may aggravate UACS and note doe is not one of her concerns Suppress cough with mucinex Max gerd RX  Proceed with w/u for allergy and sinus dz as above.  Needs cxr to complete the initial eval/ did not complete it on day of eval          Each maintenance medication was reviewed in detail including emphasizing most importantly the difference between maintenance and prns and under what circumstances the prns are to be triggered using an action plan format where appropriate.  Total time for H and P, chart review, counseling, reviewing hfa  device(s) and generating customized AVS unique to this office visit / same day charting = 45 min with pt new to me              Christinia Gully, MD 03/27/2022

## 2022-03-27 ENCOUNTER — Encounter: Payer: Self-pay | Admitting: Internal Medicine

## 2022-03-29 LAB — CBC WITH DIFFERENTIAL/PLATELET
Basophils Absolute: 0.1 10*3/uL (ref 0.0–0.2)
Basos: 1 %
EOS (ABSOLUTE): 0.1 10*3/uL (ref 0.0–0.4)
Eos: 2 %
Hematocrit: 41.4 % (ref 34.0–46.6)
Hemoglobin: 13.4 g/dL (ref 11.1–15.9)
Immature Grans (Abs): 0 10*3/uL (ref 0.0–0.1)
Immature Granulocytes: 0 %
Lymphocytes Absolute: 1.3 10*3/uL (ref 0.7–3.1)
Lymphs: 19 %
MCH: 29.7 pg (ref 26.6–33.0)
MCHC: 32.4 g/dL (ref 31.5–35.7)
MCV: 92 fL (ref 79–97)
Monocytes Absolute: 0.6 10*3/uL (ref 0.1–0.9)
Monocytes: 9 %
Neutrophils Absolute: 4.7 10*3/uL (ref 1.4–7.0)
Neutrophils: 69 %
Platelets: 342 10*3/uL (ref 150–450)
RBC: 4.51 x10E6/uL (ref 3.77–5.28)
RDW: 13.8 % (ref 11.7–15.4)
WBC: 6.8 10*3/uL (ref 3.4–10.8)

## 2022-03-29 LAB — IGE: IgE (Immunoglobulin E), Serum: 12 IU/mL (ref 6–495)

## 2022-04-05 ENCOUNTER — Telehealth: Payer: Self-pay | Admitting: *Deleted

## 2022-04-05 ENCOUNTER — Other Ambulatory Visit: Payer: Self-pay

## 2022-04-05 DIAGNOSIS — J45991 Cough variant asthma: Secondary | ICD-10-CM

## 2022-04-05 MED ORDER — MOMETASONE FURO-FORMOTEROL FUM 100-5 MCG/ACT IN AERO: 2.0000 | INHALATION_SPRAY | Freq: Every morning | RESPIRATORY_TRACT | 1 refills | Status: DC

## 2022-04-05 MED ORDER — FAMOTIDINE 20 MG PO TABS: 20.0000 mg | ORAL_TABLET | Freq: Two times a day (BID) | ORAL | 1 refills | Status: DC

## 2022-04-05 NOTE — Telephone Encounter (Signed)
Pt returned call, I instructed her to stop the Symbicort inhaler and the pantoprazole (Protonix). She states she found those medications and is removing them from her other meds. I then told her I was going to send in [place] and pepcid '20mg'$  to her preferred pharmacy [facility]). She verbalized understanding, I stressed if she needed [place] for anything or had any other reactions to call our office.

## 2022-04-05 NOTE — Telephone Encounter (Signed)
Called and spoke with patient. She stated that she did not receive Symbicort from her pharmacy and she couldn't remember the name of the inhaler. She was confused. I advised her to call us back once she gets home and is able to tell us exactly what she has using to avoid any confusion.

## 2022-04-05 NOTE — Telephone Encounter (Signed)
Pt called this morning stating that she feels like she is having an allergic reaction (lips swelling) to either the Symbicort, Famotadine, or Protonix.   Current allergies are codeine and penicillins.   Also states the her insurance isn't covering the Symbicort

## 2022-04-05 NOTE — Telephone Encounter (Signed)
Hard to say for sure but most likely would be protonix so change the pepcid to 20 mg bid  after bfast and supper and leave off protonix   Try d/c symbicort and rx dulera 100 Take 2 puffs first thing in am and then another 2 puffs about 12 hours later.   And if that fails just use albuterol prn until ov next available with formulary in hand to pick better alternative

## 2022-04-05 NOTE — Telephone Encounter (Signed)
Patient is returning a call.  Please call patient back at [phone]

## 2022-04-05 NOTE — Telephone Encounter (Signed)
Called patient but she did not answer. Left message for her to call us back.  

## 2022-04-08 ENCOUNTER — Other Ambulatory Visit: Payer: Self-pay | Admitting: Family Medicine

## 2022-04-09 ENCOUNTER — Telehealth: Payer: Self-pay

## 2022-04-09 NOTE — Telephone Encounter (Signed)
Patient called and states she is still confused about medications and states she is still having a cough.    Reread this message to her:  Rebecca Rodgers [facility]      04/05/22  3:17 PM Note Pt returned call, I instructed her to stop the Symbicort inhaler and the pantoprazole (Protonix). She states she found those medications and is removing them from her other meds. I then told her I was going to send in [place] and pepcid '20mg'$  to her preferred pharmacy [facility]). She verbalized understanding, I stressed if she needed [place] for anything or had any other reactions to call our office.     Patient still had confusion about medications and states she has cough still.   Offered patient an appt for tomorrow afternoon since there are openings. She wanted to know why she needs an appt and wanted to make sure Dr.Wert is ok with her coming in. Patient is having a hard time understanding medication instructions.

## 2022-04-09 NOTE — Telephone Encounter (Signed)
Ov with all meds/ inhalers/solutions

## 2022-04-09 NOTE — Telephone Encounter (Signed)
Called and spoke to pt. Made appt for tomorrow and she voiced understanding about bringing all medication and inhalers. She is going to bring inhalers she has been given/prescribed so we can get them straightened out. Nothing further needed

## 2022-04-10 ENCOUNTER — Ambulatory Visit (INDEPENDENT_AMBULATORY_CARE_PROVIDER_SITE_OTHER): Payer: No Typology Code available for payment source | Admitting: Internal Medicine

## 2022-04-10 ENCOUNTER — Encounter: Payer: Self-pay | Admitting: Internal Medicine

## 2022-04-10 VITALS — BP 132/78 | HR 78 | Temp 97.7°F | Ht 63.0 in | Wt 180.8 lb

## 2022-04-10 DIAGNOSIS — R0609 Other forms of dyspnea: Secondary | ICD-10-CM | POA: Diagnosis not present

## 2022-04-10 DIAGNOSIS — J45991 Cough variant asthma: Secondary | ICD-10-CM | POA: Diagnosis not present

## 2022-04-10 LAB — POCT EXHALED NITRIC OXIDE: FeNO level (ppb): 24

## 2022-04-10 NOTE — Progress Notes (Signed)
Rebecca Rodgers, female    DOB: [birth year]    MRN: [id number]   Brief patient profile:  67  yo [place] female  with nl pfts 11/14/2008 quit smoking  2014 and on Symbicort shortly p  arrived in [place] in early 2000s  referred to pulmonary clinic in [place]  03/26/2022 by Dr Moshe Cipro  for cough onset was while living long term rental house in 2021-22.   Has noticed that much better when not in house for a week.   Nose broken "years ago" forgot ENT name/ intermittent epistaxis since    History of Present Illness  03/26/2022  Pulmonary/ 1st office eval/ [name] / [place] on Symbicort 160  Chief Complaint  Patient presents with   Consult    Chronic cough ref by PCP   Dyspnea:  no doe  Cough: worse p waking / stirring x 20 min  assoc with slt yellow nasal d/c chronically Sleep: bed is flat/ 2 pillows  SABA use: rarely  02: none  Lung cancer screen: ordered by Dr Moshe Cipro Rec For cough > Mucinex dm 1200 mg every 12 hours as needed for cough (over the counter)  Change Symbicort to 80 Take 2 puffs first thing in am and then another 2 puffs about 12 hours later and blow symbicort out thru nose  Stop prilosec and start Pantoprazole (protonix) 40 mg   Take  30-60 min before first meal of the day and Pepcid (famotidine)  20 mg after supper  GERD diet/ bed blocks  referral to sinus CT  > 04/30/22  Please remember to go to the  x-ray department  > did not go Allergy screen 03/26/2022 >  Eos 0.1 /  IgE12    Please schedule a follow up office visit in 6 weeks, call sooner if needed with all medications /inhalers/ solutions in hand    04/10/2022  f/u ov/[place] office/[name] re: presumed AB maint on symb 80 / dulera 100 (confused between the two to learn they are basically the same) /brought "almost all meds" to clinic   Chief Complaint  Patient presents with   Follow-up    Confusion about medications (see phone note from yesterday) symbicort, dulera, pepcid  States she is also coughing and feels  she is breathing heavy at times   Dyspnea:  walks same speed shopping as others but sometimes sob at rest s pattern. Cough: dry / sporadic, daytime > noct, min mucoid Sleeping: 2 pillows  SABA use: none  02: none      No obvious day to day or daytime variability or assoc excess/ purulent sputum or mucus plugs or hemoptysis or cp or chest tightness, subjective wheeze or overt sinus or hb symptoms.   Sleeping  without nocturnal  or early am exacerbation  of respiratory  c/o's or need for noct saba. Also denies any obvious fluctuation of symptoms with weather or environmental changes or other aggravating or alleviating factors except as outlined above   No unusual exposure hx or h/o childhood pna/ asthma or knowledge of premature birth.  Current Allergies, Complete Past Medical History, Past Surgical History, Family History, and Social History were reviewed in [employer] record.  ROS  The following are not active complaints unless bolded Hoarseness, sore throat, dysphagia, dental problems, itching, sneezing,  nasal congestion or discharge of excess mucus or purulent secretions, ear ache,   fever, chills, sweats, unintended wt loss or wt gain, classically pleuritic or exertional cp,  orthopnea pnd or arm/hand  swelling  or leg swelling, presyncope, palpitations, abdominal pain, anorexia, nausea, vomiting, diarrhea  or change in bowel habits or change in bladder habits, change in stools or change in urine, dysuria, hematuria,  rash, arthralgias, visual complaints, headache, numbness, weakness or ataxia or problems with walking or coordination,  change in mood/anxious or  memory.        Current Meds  Medication Sig   acetaminophen (TYLENOL) 500 MG tablet Take 500 mg by mouth every 6 (six) hours as needed (pain).   albuterol (VENTOLIN HFA) 108 (90 Base) MCG/ACT inhaler INHALE 2 PUFFS INTO THE LUNGS EVERY 6 (SIX) HOURS AS NEEDED FOR WHEEZING OR SHORTNESS OF BREATH.    amLODipine (NORVASC) 10 MG tablet TAKE 1 TABLET EVERY DAY   buPROPion (WELLBUTRIN XL) 150 MG 24 hr tablet TAKE 1 TABLET EVERY DAY   Cholecalciferol (VITAMIN D) 125 MCG (5000 UT) CAPS Take 5,000 Units by mouth every Tuesday.   clotrimazole-betamethasone (LOTRISONE) cream APPLY TO AFFECTED AREAS TWICE A DAY (Patient taking differently: 1 application  daily as needed (Rash).)   ezetimibe (ZETIA) 10 MG tablet TAKE 1 TABLET EVERY DAY   famotidine (PEPCID) 20 MG tablet One after supper   famotidine (PEPCID) 20 MG tablet Take 1 tablet (20 mg total) by mouth 2 (two) times daily. After breakfast and supper   lamoTRIgine (LAMICTAL) 200 MG tablet TAKE 1 TABLET EVERY DAY   levothyroxine (SYNTHROID) 50 MCG tablet TAKE 1 TABLET DAILY MONDAY THROUGH SATURDAY AND TAKE 1 AND 1/2 TABLETS ON SUNDAY   mometasone-formoterol (DULERA) 100-5 MCG/ACT AERO Inhale 2 puffs into the lungs in the morning.   montelukast (SINGULAIR) 10 MG tablet TAKE 1 TABLET AT BEDTIME   OLANZapine (ZYPREXA) 5 MG tablet TAKE 1 TABLET AT BEDTIME   rosuvastatin (CRESTOR) 40 MG tablet TAKE 1 TABLET EVERY DAY   traZODone (DESYREL) 100 MG tablet TAKE 1 TO 2 TABLETS (100 TO 200 MG TOTAL) AT BEDTIME AS NEEDED FOR SLEEP.   triamterene-hydrochlorothiazide (MAXZIDE-25) 37.5-25 MG tablet TAKE 1 TABLET EVERY DAY   vitamin C (ASCORBIC ACID) 500 MG tablet Take 500 mg by mouth daily.   VOLTAREN 1 % GEL APPLY TO THE AFFECTED AREA(S) TWICE A DAY AS NEEDED FOR PAIN (Patient taking differently: 2 g daily as needed (pain).)           Past Medical History:  Diagnosis Date   Anxiety    Asthma    Depression    Hx of hysterectomy    Hyperlipidemia    Hypertension    Hypothyroidism    Suicide attempt ([place]) 1997   when a relationship broke up     Suicide attempt ([place])    3 months ago because of ill health of her parents    Uterine cancer ([place]) 2005       Objective:     Wt Readings from Last 3 Encounters:  04/10/22 180 lb 12.8 oz (82 kg)   03/26/22 181 lb 6.4 oz (82.3 kg)  12/21/21 175 lb (79.4 kg)    Vital signs reviewed  04/10/2022  - Note at rest 02 sats  97% on RA   General appearance:    slt hoarse amb mod obese female nad        HEENT : Oropharynx  clear          NECK :  without  apparent JVD/ palpable Nodes/TM    LUNGS: no acc muscle use,  Nl contour chest which is clear to A and P bilaterally without  cough on insp or exp maneuvers   CV:  RRR  no s3 or murmur or increase in P2, and no edema   ABD:  soft and nontender with nl inspiratory excursion in the supine position. No bruits or organomegaly appreciated   MS:  Nl gait/ ext warm without deformities Or obvious joint restrictions  calf tenderness, cyanosis or clubbing    SKIN: warm and dry without lesions    NEURO:  alert, approp, nl sensorium with  no motor or cerebellar deficits apparent.      chest X-ray 04/10/2022 >>> did not go as rec  Assessment

## 2022-04-10 NOTE — Patient Instructions (Addendum)
Stop robitussin and mucinex dm   For cough > delsym cough syrup 2 tsp every 12 hours as needed   Restart omeprazole 20 mg Take 30-60 min before first meal of the day and continue famotidine 20 mg after supper   Continue symbicort 80 Take 2 puffs first thing in am and then another 2 puffs about 12 hours later but work really hard on your technique as reviewed today - after a week try off the symbicort and see what difference if any this makes and if worse cough or breathing or need for albuterol then restart   Work on inhaler technique:  relax and gently blow all the way out then take a nice smooth full deep breath back in, triggering the inhaler at same time you start breathing in.  Hold breath in for at least  5 seconds if you can. Blow out symbicort 80  thru nose. Rinse and gargle with water when done.  If mouth or throat bother you at all,  try brushing teeth/gums/tongue with arm and hammer toothpaste/ make a slurry and gargle and spit out.       Only use your albuterol as a rescue medication to be used if you can't catch your breath by resting or doing a relaxed purse lip breathing pattern.  - The less you use it, the better it will work when you need it. - Ok to use up to 2 puffs  every 4 hours if you must but call for immediate appointment if use goes up over your usual need - Don't leave home without it !!  (think of it like the spare tire for your car)   Also  Ok to try albuterol 15 min before an activity (on alternating days)  that you know would usually make you short of breath and see if it makes any difference and if makes none then don't take albuterol after activity unless you can't catch your breath as this means it's the resting that helps, not the albuterol.      Please remember to go to the  x-ray department  @  [facility] for your tests - we will call you with the results when they are available     Keep previous appt but need PFTS next available and don't use your  symbicort that morning

## 2022-04-11 ENCOUNTER — Other Ambulatory Visit: Payer: Self-pay

## 2022-04-11 ENCOUNTER — Encounter: Payer: Self-pay | Admitting: Internal Medicine

## 2022-04-11 DIAGNOSIS — J45991 Cough variant asthma: Secondary | ICD-10-CM

## 2022-04-11 DIAGNOSIS — R0609 Other forms of dyspnea: Secondary | ICD-10-CM | POA: Insufficient documentation

## 2022-04-11 NOTE — Assessment & Plan Note (Signed)
Onset ? Early 2000s with nl pfts 11/14/08 - 04/10/2022   Walked on RA  x  3  lap(s) =  approx 450  ft  @ fast pace, stopped due to end of study with lowest 02 sats 96% with sob at onset and thruout about the same but speaking in full sentences    Not able to reproduce this chronic symptom but suspect it is related to anxiety/ deconditioning   F/u with full pfts w/a  Each maintenance medication was reviewed in detail including emphasizing most importantly the difference between maintenance and prns and under what circumstances the prns are to be triggered using an action plan format where appropriate.  Total time for H and P, chart review, counseling, reviewing hfa device(s) , directly observing portions of ambulatory 02 saturation study/ and generating customized AVS unique to this office visit / same day charting = > 30 min for multiple  refractory respiratory  symptoms of uncertain etiology

## 2022-04-11 NOTE — Assessment & Plan Note (Addendum)
Onset  around 2021 in pt on symb 160 for AB  -  nl pfts 11/14/2008 quit smoking  2014  - 03/26/2022 reduced symbort to 80 2bid/ max gerd rx due to upper > lower airway symptoms  - Allergy screen 03/26/2022 >  Eos 0.1 /  IgE12   - 03/26/2022  After extensive coaching inhaler device,  effectiveness =    60% from a baseline of < 30%  - FENO 04/10/2022  = 24 on symb 80 2bid with poor hfa technique - 04/10/2022  After extensive coaching inhaler device,  effectiveness =    50%  - Sinus CT 04/30/22>>>  DDX of  difficult airways management almost all start with A and  include Adherence, Ace Inhibitors, Acid Reflux, Active Sinus Disease, Alpha 1 Antitripsin deficiency, Anxiety masquerading as Airways dz,  ABPA,  Allergy(esp in young), Aspiration (esp in elderly), Adverse effects of meds,  Active smoking or vaping, A bunch of PE's (a small clot burden can't cause this syndrome unless there is already severe underlying pulm or vascular dz with poor reserve) plus two Bs  = Bronchiectasis and Beta blocker use..and one C= CHF  Adherence is always the initial "prime suspect" and is a multilayered concern that requires a "trust but verify" approach in every patient - starting with knowing how to use medications, especially inhalers, correctly, keeping up with refills and understanding the fundamental difference between maintenance and prns vs those medications only taken for a very short course and then stopped and not refilled.  - very confused with meds - see hfa teaching above  ? Acid (or non-acid) GERD > always difficult to exclude as up to 75% of pts in some series report no assoc GI/ Heartburn symptoms> rec continue max (24h)  acid suppression and diet restrictions/ reviewed but can't take protonix so just use prilosec as before ac q am and continue pepcid 20 mg p supper  ? Allergy/ asthma > no evidence of asthma at present but hard to exclude > continue symb 80 or dulera 100  ? Active sinus dz > sinus ct  ?  Anxiety/depression  > usually at the bottom of this list of usual suspects but should be much higher on this pt's based on H and P and note already on psychotropics and may interfere with adherence and also interpretation of response or lack thereof to symptom management which can be quite subjective.

## 2022-04-17 ENCOUNTER — Ambulatory Visit (HOSPITAL_COMMUNITY)
Admission: RE | Admit: 2022-04-17 | Discharge: 2022-04-17 | Disposition: A | Discharge status: Home / Self Care | Payer: No Typology Code available for payment source | Source: Ambulatory Visit | Attending: Internal Medicine | Admitting: Internal Medicine

## 2022-04-17 DIAGNOSIS — J45991 Cough variant asthma: Secondary | ICD-10-CM | POA: Diagnosis not present

## 2022-04-17 DIAGNOSIS — R059 Cough, unspecified: Secondary | ICD-10-CM | POA: Diagnosis not present

## 2022-04-17 DIAGNOSIS — R0602 Shortness of breath: Secondary | ICD-10-CM | POA: Diagnosis not present

## 2022-04-30 ENCOUNTER — Ambulatory Visit (HOSPITAL_COMMUNITY): Payer: No Typology Code available for payment source

## 2022-05-01 ENCOUNTER — Ambulatory Visit (HOSPITAL_COMMUNITY)
Admission: RE | Admit: 2022-05-01 | Discharge: 2022-05-01 | Disposition: A | Discharge status: Home / Self Care | Payer: No Typology Code available for payment source | Source: Ambulatory Visit | Attending: Internal Medicine | Admitting: Internal Medicine

## 2022-05-01 DIAGNOSIS — J342 Deviated nasal septum: Secondary | ICD-10-CM | POA: Diagnosis not present

## 2022-05-01 DIAGNOSIS — J45991 Cough variant asthma: Secondary | ICD-10-CM | POA: Insufficient documentation

## 2022-05-07 ENCOUNTER — Ambulatory Visit (INDEPENDENT_AMBULATORY_CARE_PROVIDER_SITE_OTHER): Payer: No Typology Code available for payment source | Admitting: Internal Medicine

## 2022-05-07 ENCOUNTER — Encounter: Payer: Self-pay | Admitting: Internal Medicine

## 2022-05-07 VITALS — BP 132/76 | HR 86 | Ht 63.0 in | Wt 183.2 lb

## 2022-05-07 DIAGNOSIS — J45991 Cough variant asthma: Secondary | ICD-10-CM | POA: Diagnosis not present

## 2022-05-07 MED ORDER — BENZONATATE 200 MG PO CAPS: 200.0000 mg | ORAL_CAPSULE | Freq: Three times a day (TID) | ORAL | 1 refills | Status: DC | PRN

## 2022-05-07 MED ORDER — BUDESONIDE-FORMOTEROL FUMARATE 80-4.5 MCG/ACT IN AERO: INHALATION_SPRAY | RESPIRATORY_TRACT | 12 refills | Status: DC

## 2022-05-07 NOTE — Patient Instructions (Addendum)
Try symbicort 80 x 1-2 puffs   30 min  before an activity (on alternating days)  that you know would usually make you short of breath and see if it makes any difference and if makes   If you find symbicort helps then ok to take up to 2 puffs every 12 hours   Do not take symbicort before your lung function test   For cough > tessalon 200 mg every  6-8 hours as needed   Please schedule a follow up office visit in 6 weeks, call sooner if needed

## 2022-05-07 NOTE — Assessment & Plan Note (Addendum)
Onset  around 2021 in pt on symb 160 for AB  -  nl pfts 11/14/2008 quit smoking  2014  - 03/26/2022 reduced symbort to 80 2bid/ max gerd rx due to upper > lower airway symptoms  - Allergy screen 03/26/2022 >  Eos 0.1 /  IgE12   - 03/26/2022  After extensive coaching inhaler device,  effectiveness =    60% from a baseline of < 30%  - FENO 04/10/2022  = 24 on symb 80 2bid with poor hfa technique - Sinus CT 05/01/22 >>>wnl  - 05/07/2022  After extensive coaching inhaler device,  effectiveness =    80% > ok to use symbicort 80 up to 2 bid  - 05/07/2022 added tessalon  200 prn for daytime throat clearing / globus sensation   Still not clear if she actually has a pulmonary problem but certain she has component of Upper airway cough syndrome (previously labeled PNDS),  is so named because it's frequently impossible to sort out how much is  CR/sinusitis with freq throat clearing (which can be related to primary GERD)   vs  causing  secondary (" extra esophageal")  GERD from wide swings in gastric pressure that occur with throat clearing, often  promoting self use of mint and menthol lozenges that reduce the lower esophageal sphincter tone and exacerbate the problem further in a cyclical fashion.   These are the same pts (now being labeled as having "irritable larynx syndrome" by some cough centers) who not infrequently have a history of having failed to tolerate ace inhibitors,  dry powder inhalers(and sometimes high dose hfa ICS)  or biphosphonates or report having atypical/extraesophageal reflux symptoms that don't respond to standard doses of PPI  and are easily confused as having aecopd or asthma flares by even experienced allergists/ pulmonologists (myself included).   >>> rec max rx for gerd/ prn symbicort 80  up to 2 bid  Based on two studies from NEJM  378; 20 p 1865 (2018) and 380 : p2020-30 (2019) in pts with mild asthma it is reasonable to use low dose symbicort eg 80 2bid "prn" flare in this setting but I  emphasized this was only shown with symbicort and takes advantage of the rapid onset of action but is not the same as "rescue therapy" but can be stopped once the acute symptoms have resolved and the need for rescue has been minimized (< 2 x weekly)    F/u in 6 weeks, call sooner if needed          Each maintenance medication was reviewed in detail including emphasizing most importantly the difference between maintenance and prns and under what circumstances the prns are to be triggered using an action plan format where appropriate.  Total time for H and P, chart review, counseling, reviewing hfa device(s) and generating customized AVS unique to this office visit / same day charting = 25 min

## 2022-05-07 NOTE — Progress Notes (Signed)
Rebecca Rodgers, female    DOB: 03/01/1956    MRN: [id number]   Brief patient profile:  67  yo [place] female  with nl pfts 11/14/2008 quit smoking  2014 and on Symbicort shortly p  arrived in [place] in early 2000s  referred to pulmonary clinic in [place]  03/26/2022 by Dr Moshe Cipro  for cough onset was while living long term rental house in 2021-22.   Has noticed that much better when not in house for a week.   Nose broken "years ago" forgot ENT name/ intermittent epistaxis since    History of Present Illness  03/26/2022  Pulmonary/ 1st office eval/ Srihari Shellhammer / [place] on Symbicort 160  Chief Complaint  Patient presents with   Consult    Chronic cough ref by PCP   Dyspnea:  no doe  Cough: worse p waking / stirring x 20 min  assoc with slt yellow nasal d/c chronically Sleep: bed is flat/ 2 pillows  SABA use: rarely  02: none  Lung cancer screen: ordered by Dr Moshe Cipro Rec For cough > Mucinex dm 1200 mg every 12 hours as needed for cough (over the counter)  Change Symbicort to 80 Take 2 puffs first thing in am and then another 2 puffs about 12 hours later and blow symbicort out thru nose  Stop prilosec and start Pantoprazole (protonix) 40 mg   Take  30-60 min before first meal of the day and Pepcid (famotidine)  20 mg after supper  GERD diet/ bed blocks  referral to sinus CT  > 04/30/22  Please remember to go to the  x-ray department  > did not go Allergy screen 03/26/2022 >  Eos 0.1 /  IgE12    Please schedule a follow up office visit in 6 weeks, call sooner if needed with all medications /inhalers/ solutions in hand    04/10/2022  f/u ov/[place] office/Alonza Knisley re: presumed AB maint on symb 80 / dulera 100 (confused between the two to learn they are basically the same) /brought "almost all meds" to clinic   Chief Complaint  Patient presents with   Follow-up    Confusion about medications (see phone note from yesterday) symbicort, dulera, pepcid  States she is also coughing and feels  she is breathing heavy at times   Dyspnea:  walks same speed shopping as others but sometimes sob at rest s pattern. Cough: dry / sporadic, daytime > noct, min mucoid Sleeping: 2 pillows  SABA use: none  02: none  Rec Stop robitussin and mucinex dm  For cough > delsym cough syrup 2 tsp every 12 hours as needed  Restart omeprazole 20 mg Take 30-60 min before first meal of the day and continue famotidine 20 mg after supper  Continue symbicort 80 Take 2 puffs first thing in am and then another 2 puffs about 12 hours later but work really hard on your technique as reviewed today - after a week try off the symbicort and see what difference if any this makes and if worse cough or breathing or need for albuterol then restart  Work on inhaler technique:   Only use your albuterol as a rescue medication  Also  Ok to try albuterol 15 min before an activity (on alternating days)  that you know would usually make you short of breath      Keep previous appt but need PFTS next available and don't use your symbicort that morning    05/07/2022  f/u ov/[place] office/Goldie Dimmer re: cough  maint on no resp rx  Chief Complaint  Patient presents with   Follow-up    Cough has improved   Dyspnea:  not sure symbiocort 80 improving ex tol  Cough: mostly throat clearing  Sleeping: level bed 45 degrees with pillows throat "congestion" does not wake her up or flare in am on awakening  SABA use: none    No obvious day to day or daytime variability or assoc excess/ purulent sputum or mucus plugs or hemoptysis or cp or chest tightness, subjective wheeze or overt sinus or hb symptoms.   Sleeping  without nocturnal  or early am exacerbation  of respiratory  c/o's or need for noct saba. Also denies any obvious fluctuation of symptoms with weather or environmental changes or other aggravating or alleviating factors except as outlined above   No unusual exposure hx or h/o childhood pna/ asthma or knowledge of premature  birth.  Current Allergies, Complete Past Medical History, Past Surgical History, Family History, and Social History were reviewed in [employer] record.  ROS  The following are not active complaints unless bolded Hoarseness, sore throat, dysphagia, dental problems, itching, sneezing,  nasal congestion or sense of discharge of excess mucus or purulent secretions, ear ache,   fever, chills, sweats, unintended wt loss or wt gain, classically pleuritic or exertional cp,  orthopnea pnd or arm/hand swelling  or leg swelling, presyncope, palpitations, abdominal pain, anorexia, nausea, vomiting, diarrhea  or change in bowel habits or change in bladder habits, change in stools or change in urine, dysuria, hematuria,  rash, arthralgias, visual complaints, headache, numbness, weakness or ataxia or problems with walking or coordination,  change in mood or  memory.        Current Meds  Medication Sig   acetaminophen (TYLENOL) 500 MG tablet Take 500 mg by mouth every 6 (six) hours as needed (pain).   albuterol (VENTOLIN HFA) 108 (90 Base) MCG/ACT inhaler INHALE 2 PUFFS INTO THE LUNGS EVERY 6 (SIX) HOURS AS NEEDED FOR WHEEZING OR SHORTNESS OF BREATH.   amLODipine (NORVASC) 10 MG tablet TAKE 1 TABLET EVERY DAY   benzonatate (TESSALON) 200 MG capsule Take 1 capsule (200 mg total) by mouth 3 (three) times daily as needed for cough.   buPROPion (WELLBUTRIN XL) 150 MG 24 hr tablet TAKE 1 TABLET EVERY DAY   Cholecalciferol (VITAMIN D) 125 MCG (5000 UT) CAPS Take 5,000 Units by mouth every Tuesday.   clotrimazole-betamethasone (LOTRISONE) cream APPLY TO AFFECTED AREAS TWICE A DAY (Patient taking differently: 1 application  daily as needed (Rash).)   ezetimibe (ZETIA) 10 MG tablet TAKE 1 TABLET EVERY DAY   famotidine (PEPCID) 20 MG tablet One after supper   famotidine (PEPCID) 20 MG tablet Take 1 tablet (20 mg total) by mouth 2 (two) times daily. After breakfast and supper   lamoTRIgine  (LAMICTAL) 200 MG tablet TAKE 1 TABLET EVERY DAY   levothyroxine (SYNTHROID) 50 MCG tablet TAKE 1 TABLET DAILY MONDAY THROUGH SATURDAY AND TAKE 1 AND 1/2 TABLETS ON SUNDAY   montelukast (SINGULAIR) 10 MG tablet TAKE 1 TABLET AT BEDTIME   OLANZapine (ZYPREXA) 5 MG tablet TAKE 1 TABLET AT BEDTIME   rosuvastatin (CRESTOR) 40 MG tablet TAKE 1 TABLET EVERY DAY   traZODone (DESYREL) 100 MG tablet TAKE 1 TO 2 TABLETS (100 TO 200 MG TOTAL) AT BEDTIME AS NEEDED FOR SLEEP.   triamterene-hydrochlorothiazide (MAXZIDE-25) 37.5-25 MG tablet TAKE 1 TABLET EVERY DAY   vitamin C (ASCORBIC ACID) 500 MG tablet Take 500  mg by mouth daily.   VOLTAREN 1 % GEL APPLY TO THE AFFECTED AREA(S) TWICE A DAY AS NEEDED FOR PAIN (Patient taking differently: 2 g daily as needed (pain).)                Past Medical History:  Diagnosis Date   Anxiety    Asthma    Depression    Hx of hysterectomy    Hyperlipidemia    Hypertension    Hypothyroidism    Suicide attempt ([place]) 1997   when a relationship broke up     Suicide attempt ([place])    3 months ago because of ill health of her parents    Uterine cancer ([place]) 2005       Objective:     05/07/2022        183   04/10/22 180 lb 12.8 oz (82 kg)  03/26/22 181 lb 6.4 oz (82.3 kg)  12/21/21 175 lb (79.4 kg)     Vital signs reviewed  05/07/2022  - Note at rest 02 sats  98% on RA   General appearance:    amb jamaican mod obese female freq throat clearing     HEENT : Oropharynx  clear      NECK :  without  apparent JVD/ palpable Nodes/TM    LUNGS: no acc muscle use,  Nl contour chest which is clear to A and P bilaterally without cough on insp or exp maneuvers   CV:  RRR  no s3 or murmur or increase in P2, and no edema   ABD: obese  soft and nontender    MS:  Nl gait/ ext warm without deformities Or obvious joint restrictions  calf tenderness, cyanosis or clubbing    SKIN: warm and dry without lesions    NEURO:  alert, approp, nl sensorium with  no motor  or cerebellar deficits apparent.      I personally reviewed images and agree with radiology impression as follows:  CXR:   04/17/22 No active dz   Assessment

## 2022-05-21 ENCOUNTER — Ambulatory Visit (HOSPITAL_COMMUNITY)
Admission: RE | Admit: 2022-05-21 | Discharge: 2022-05-21 | Disposition: A | Discharge status: Home / Self Care | Payer: No Typology Code available for payment source | Source: Ambulatory Visit | Attending: Internal Medicine | Admitting: Internal Medicine

## 2022-05-21 DIAGNOSIS — J45991 Cough variant asthma: Secondary | ICD-10-CM

## 2022-05-21 LAB — PULMONARY FUNCTION TEST
DL/VA % pred: 95 %
DL/VA: 4 ml/min/mmHg/L
DLCO unc % pred: 78 %
DLCO unc: 14.91 ml/min/mmHg
FEF 25-75 Post: 2.24 L/sec
FEF 25-75 Pre: 2.22 L/sec
FEF2575-%Change-Post: 0 %
FEF2575-%Pred-Post: 110 %
FEF2575-%Pred-Pre: 109 %
FEV1-%Change-Post: 0 %
FEV1-%Pred-Post: 89 %
FEV1-%Pred-Pre: 89 %
FEV1-Post: 2.05 L
FEV1-Pre: 2.06 L
FEV1FVC-%Change-Post: 7 %
FEV1FVC-%Pred-Pre: 106 %
FEV6-%Change-Post: -7 %
FEV6-%Pred-Post: 80 %
FEV6-%Pred-Pre: 87 %
FEV6-Post: 2.32 L
FEV6-Pre: 2.52 L
FEV6FVC-%Change-Post: 0 %
FEV6FVC-%Pred-Post: 104 %
FEV6FVC-%Pred-Pre: 103 %
FVC-%Change-Post: -6 %
FVC-%Pred-Post: 78 %
FVC-%Pred-Pre: 84 %
FVC-Post: 2.36 L
FVC-Pre: 2.53 L
Post FEV1/FVC ratio: 87 %
Post FEV6/FVC ratio: 100 %
Pre FEV1/FVC ratio: 81 %
Pre FEV6/FVC Ratio: 100 %

## 2022-05-21 MED ORDER — ALBUTEROL SULFATE (2.5 MG/3ML) 0.083% IN NEBU
2.5000 mg | INHALATION_SOLUTION | Freq: Once | RESPIRATORY_TRACT | Status: AC
  Administered 2022-05-21: 2.5 mg via RESPIRATORY_TRACT

## 2022-05-22 ENCOUNTER — Telehealth: Payer: Self-pay | Admitting: Internal Medicine

## 2022-05-22 NOTE — Telephone Encounter (Signed)
PT calling because Dr. Melvyn Novas called her about a med he wanted her to go on. She did not write it down and does not remember and she says it was not on the AVS notes. Pls call @ [phone]   Pharm: [facility]

## 2022-05-22 NOTE — Telephone Encounter (Signed)
So now she is saying she had 4 meds and is not sure what to take or when to take. Thanks.

## 2022-05-23 NOTE — Telephone Encounter (Signed)
Patient is calling again to get a reply about her medication.  Please call patient to discuss at [phone]

## 2022-05-23 NOTE — Telephone Encounter (Signed)
I called and spoke to patient.  She had questions about what she was supposed to do with her medications.  Verified for her per last AVS tessalon perles are prn every 6-8 hours for cough but as needed, she had been taking these three times a day and wasn't sure what they were for.  Also verified that famotidine is 20 mg after supper daily.  As well as Symbicort instructions per last AVS: Try symbicort 80 x 1-2 puffs   30 min  before an activity (on alternating days)  that you know would usually make you short of breath and see if it makes any difference and  If you find symbicort helps then ok to take up to 2 puffs every 12 hours.   She voiced understanding of these instructions and confirmed she had no further questions.

## 2022-06-17 NOTE — Progress Notes (Unsigned)
Rebecca Rodgers, female    DOB: 1955/06/27    MRN: [id number]   Brief patient profile:  67 yo [place] female  with nl pfts 11/14/2008 quit smoking  2014 and on Symbicort shortly p  arrived in [place] in early 2000s  referred to pulmonary clinic in [place]  03/26/2022 by Dr Moshe Cipro  for cough onset was while living long term rental house in 2021-22.   Has noticed that much better when not in house for a week.   Nose broken "years ago" forgot ENT name/ intermittent epistaxis since    History of Present Illness  03/26/2022  Pulmonary/ 1st office eval/ Rebecca Rodgers / [place] on Symbicort 160  Chief Complaint  Patient presents with   Consult    Chronic cough ref by PCP   Dyspnea:  no doe  Cough: worse p waking / stirring x 20 min  assoc with slt yellow nasal d/c chronically Sleep: bed is flat/ 2 pillows  SABA use: rarely  02: none  Lung cancer screen: ordered by Dr Moshe Cipro Rec For cough > Mucinex dm 1200 mg every 12 hours as needed for cough (over the counter)  Change Symbicort to 80 Take 2 puffs first thing in am and then another 2 puffs about 12 hours later and blow symbicort out thru nose  Stop prilosec and start Pantoprazole (protonix) 40 mg   Take  30-60 min before first meal of the day and Pepcid (famotidine)  20 mg after supper  GERD diet/ bed blocks  referral to sinus CT  > 04/30/22  Please remember to go to the  x-ray department  > did not go Allergy screen 03/26/2022 >  Eos 0.1 /  IgE12    Please schedule a follow up office visit in 6 weeks, call sooner if needed with all medications /inhalers/ solutions in hand    04/10/2022  f/u ov/[place] office/Rebecca Rodgers re: presumed AB maint on symb 80 / dulera 100 (confused between the two to learn they are basically the same) /brought "almost all meds" to clinic   Chief Complaint  Patient presents with   Follow-up    Confusion about medications (see phone note from yesterday) symbicort, dulera, pepcid  States she is also coughing and feels  she is breathing heavy at times   Dyspnea:  walks same speed shopping as others but sometimes sob at rest s pattern. Cough: dry / sporadic, daytime > noct, min mucoid Sleeping: 2 pillows  SABA use: none  02: none  Rec Stop robitussin and mucinex dm  For cough > delsym cough syrup 2 tsp every 12 hours as needed  Restart omeprazole 20 mg Take 30-60 min before first meal of the day and continue famotidine 20 mg after supper  Continue symbicort 80 Take 2 puffs first thing in am and then another 2 puffs about 12 hours later but work really hard on your technique as reviewed today - after a week try off the symbicort and see what difference if any this makes and if worse cough or breathing or need for albuterol then restart  Work on inhaler technique:   Only use your albuterol as a rescue medication  Also  Ok to try albuterol 15 min before an activity (on alternating days)  that you know would usually make you short of breath      Keep previous appt but need PFTS next available and don't use your symbicort that morning    05/07/2022  f/u ov/[place] office/Rebecca Rodgers re: cough  maint on no resp rx  Chief Complaint  Patient presents with   Follow-up    Cough has improved   Dyspnea:  not sure symbiocort 80 improving ex tol  Cough: mostly throat clearing  Sleeping: level bed 45 degrees with pillows throat "congestion" does not wake her up or flare in am on awakening  SABA use: none  Rec Try symbicort 80 x 1-2 puffs   30 min  before an activity (on alternating days)  that you know would usually make you short of breath and see if it makes any difference and if makes  If you find symbicort helps then ok to take up to 2 puffs every 12 hours  Do not take symbicort before your lung function test  For cough > tessalon 200 mg every  6-8 hours as needed  Please schedule a follow up office visit in 6 weeks, call sooner if needed    06/18/2022  f/u ov/[place] office/Rebecca Rodgers re: cough variant asthma vs uacs  maint on just pepcid p supper and prn symbicort 80 not sure helping   Chief Complaint  Patient presents with   Follow-up    Doing well   Dyspnea:  sometimes takes symbicort before housework and sometimes before gym  treadmill  Cough: cough is worse at hs  Sleeping: 45 degrees with flat bed/ lots of pillows  SABA use: none 02: none     No obvious day to day or daytime variability or assoc excess/ purulent sputum or mucus plugs or hemoptysis or cp or chest tightness, subjective wheeze or overt sinus or hb symptoms.    Also denies any obvious fluctuation of symptoms with weather or environmental changes or other aggravating or alleviating factors except as outlined above   No unusual exposure hx or h/o childhood pna/ asthma or knowledge of premature birth.  Current Allergies, Complete Past Medical History, Past Surgical History, Family History, and Social History were reviewed in [employer] record.  ROS  The following are not active complaints unless bolded Hoarseness, sore throat, dysphagia, dental problems, itching, sneezing,  nasal congestion or discharge of excess mucus or purulent secretions, ear ache,   fever, chills, sweats, unintended wt loss or wt gain, classically pleuritic or exertional cp,  orthopnea pnd or arm/hand swelling  or leg swelling, presyncope, palpitations, abdominal pain, anorexia, nausea, vomiting, diarrhea  or change in bowel habits or change in bladder habits, change in stools or change in urine, dysuria, hematuria,  rash, arthralgias, visual complaints, headache, numbness, weakness or ataxia or problems with walking or coordination,  change in mood or  memory.        Current Meds  Medication Sig   acetaminophen (TYLENOL) 500 MG tablet Take 500 mg by mouth every 6 (six) hours as needed (pain).   albuterol (VENTOLIN HFA) 108 (90 Base) MCG/ACT inhaler INHALE 2 PUFFS INTO THE LUNGS EVERY 6 (SIX) HOURS AS NEEDED FOR WHEEZING OR SHORTNESS OF  BREATH.   amLODipine (NORVASC) 10 MG tablet TAKE 1 TABLET EVERY DAY   benzonatate (TESSALON) 200 MG capsule Take 1 capsule (200 mg total) by mouth 3 (three) times daily as needed for cough.   budesonide-formoterol (SYMBICORT) 80-4.5 MCG/ACT inhaler 1-2 pffs every 12 hours as needed   buPROPion (WELLBUTRIN XL) 150 MG 24 hr tablet TAKE 1 TABLET EVERY DAY   Cholecalciferol (VITAMIN D) 125 MCG (5000 UT) CAPS Take 5,000 Units by mouth every Tuesday.   clotrimazole-betamethasone (LOTRISONE) cream APPLY TO AFFECTED AREAS TWICE A DAY (  Patient taking differently: 1 application  daily as needed (Rash).)   ezetimibe (ZETIA) 10 MG tablet TAKE 1 TABLET EVERY DAY   famotidine (PEPCID) 20 MG tablet One after supper   famotidine (PEPCID) 20 MG tablet Take 1 tablet (20 mg total) by mouth 2 (two) times daily. After breakfast and supper   lamoTRIgine (LAMICTAL) 200 MG tablet TAKE 1 TABLET EVERY DAY   levothyroxine (SYNTHROID) 50 MCG tablet TAKE 1 TABLET DAILY MONDAY THROUGH SATURDAY AND TAKE 1 AND 1/2 TABLETS ON SUNDAY   montelukast (SINGULAIR) 10 MG tablet TAKE 1 TABLET AT BEDTIME   OLANZapine (ZYPREXA) 5 MG tablet TAKE 1 TABLET AT BEDTIME   rosuvastatin (CRESTOR) 40 MG tablet TAKE 1 TABLET EVERY DAY   traZODone (DESYREL) 100 MG tablet TAKE 1 TO 2 TABLETS (100 TO 200 MG TOTAL) AT BEDTIME AS NEEDED FOR SLEEP.   triamterene-hydrochlorothiazide (MAXZIDE-25) 37.5-25 MG tablet TAKE 1 TABLET EVERY DAY   vitamin C (ASCORBIC ACID) 500 MG tablet Take 500 mg by mouth daily.   VOLTAREN 1 % GEL APPLY TO THE AFFECTED AREA(S) TWICE A DAY AS NEEDED FOR PAIN (Patient taking differently: 2 g daily as needed (pain).)           Past Medical History:  Diagnosis Date   Anxiety    Asthma    Depression    Hx of hysterectomy    Hyperlipidemia    Hypertension    Hypothyroidism    Suicide attempt ([facility]) 1997   when a relationship broke up     Suicide attempt ([facility])    3 months ago because of ill health of her parents     Uterine cancer ([facility]) 2005       Objective:    wts  06/18/2022        180 05/07/2022        18 3   04/10/22 180 lb 12.8 oz (82 kg)  03/26/22 181 lb 6.4 oz (82.3 kg)  12/21/21 175 lb (79.4 kg)    Vital signs reviewed  06/18/2022  - Note at rest 02 sats  97% on RA   General appearance:    amb slt hoarse PR female freq throat clearing     HEENT : Oropharynx  clear       NECK :  without  apparent JVD/ palpable Nodes/TM    LUNGS: no acc muscle use,  Nl contour chest which is clear to A and P bilaterally without cough on insp or exp maneuvers   CV:  RRR  no s3 or murmur or increase in P2, and no edema   ABD: Obese,  soft and nontender    MS:  Nl gait/ ext warm without deformities Or obvious joint restrictions  calf tenderness, cyanosis or clubbing    SKIN: warm and dry without lesions    NEURO:  alert, approp, nl sensorium with  no motor or cerebellar deficits apparent.        Assessment

## 2022-06-18 ENCOUNTER — Encounter: Payer: Self-pay | Admitting: Internal Medicine

## 2022-06-18 ENCOUNTER — Ambulatory Visit (INDEPENDENT_AMBULATORY_CARE_PROVIDER_SITE_OTHER): Payer: No Typology Code available for payment source | Admitting: Internal Medicine

## 2022-06-18 VITALS — BP 132/68 | HR 92 | Wt 180.6 lb

## 2022-06-18 DIAGNOSIS — R0609 Other forms of dyspnea: Secondary | ICD-10-CM | POA: Diagnosis not present

## 2022-06-18 DIAGNOSIS — J45991 Cough variant asthma: Secondary | ICD-10-CM | POA: Diagnosis not present

## 2022-06-18 MED ORDER — OMEPRAZOLE 40 MG PO CPDR: DELAYED_RELEASE_CAPSULE | ORAL | 11 refills | Status: DC

## 2022-06-18 NOTE — Assessment & Plan Note (Signed)
Onset  around 2021 in pt on symb 160 for AB  -  nl pfts 11/14/2008 quit smoking  2014  - 03/26/2022 reduced symbort to 80 2bid/ max gerd rx due to upper > lower airway symptoms  - Allergy screen 03/26/2022 >  Eos 0.1 /  IgE12   - 03/26/2022  After extensive coaching inhaler device,  effectiveness =    60% from a baseline of < 30%  - FENO 04/10/2022  = 24 on symb 80 2bid with poor hfa technique - Sinus CT 05/01/22 >>>wnl  - 05/07/2022  After extensive coaching inhaler device,  effectiveness =    80% > ok to use symbicort 80 up to 2 bid  - 05/07/2022 added tessalon  200 prn > not really helpful  - PFT's  05/21/22  FEV1 2.05 (89 % ) ratio 0.87  p 0 % improvement from saba p 0 prior to study with DLCO  14.44 (78%)   and FV curve blunted in effort dep portion  and ERV 42% at wt 182   Really not clear at all this is really asthma so rec symbicort 80 2bid / max gerd rx x 2 full weeks and if not better then stop symbicort but continue gerd rx for now.  Each maintenance medication was reviewed in detail including emphasizing most importantly the difference between maintenance and prns and under what circumstances the prns are to be triggered using an action plan format where appropriate.  Total time for H and P, chart review, counseling, reviewing hfa  device(s) and generating customized AVS unique to this office visit / same day charting> 30 min for  refractory respiratory  symptoms of uncertain etiology.

## 2022-06-18 NOTE — Patient Instructions (Addendum)
Symbicort 80  Take 2 puffs first thing in am and then another 2 puffs about 12 hours later x 2 full weeks and if not better breathing/ coughing try back off x at least a week to establish what if any changes you notice   Prilosec (omeprazole) 40 mg Take 30-60 min before first meal of the day and famotidine 20 mg after supper or bedtime   GERD (REFLUX)  is an extremely common cause of respiratory symptoms just like yours , many times with no obvious heartburn at all.    It can be treated with medication, but also with lifestyle changes including elevation of the head of your bed (ideally with 6-8inch blocks under the headboard of your bed),  Smoking cessation, avoidance of late meals, excessive alcohol, and avoid fatty foods, chocolate, peppermint, colas, red wine, and acidic juices such as orange juice.  NO MINT OR MENTHOL PRODUCTS SO NO COUGH DROPS  USE SUGARLESS CANDY INSTEAD (Jolley ranchers or Stover's or Life Savers) or even ice chips will also do - the key is to swallow to prevent all throat clearing. NO OIL BASED VITAMINS - use powdered substitutes.  Avoid fish oil when coughing.   Please schedule a follow up visit in 3 months but call sooner if needed

## 2022-06-18 NOTE — Assessment & Plan Note (Signed)
Onset ? Early 2000s with nl pfts 11/14/08 - 04/10/2022   Walked on RA  x  3  lap(s) =  approx 450  ft  @ fast pace, stopped due to end of study with lowest 02 sats 96% with sob at onset and thruout about the same but speaking in full sentences   - PFT's  05/21/22  FEV1 2.05 (89 % ) ratio 0.87  p 0 % improvement from saba p 0 prior to study with DLCO  14.44 (78%)   and FV curve blunted in effort dep portion  and ERV 42% at wt 182    Reviewed findings with pt indication body habitus playing a larger roll in doe than airflow obst at this point   See cough variant asthma re trial of symbicort 80 x 2 weeks only

## 2022-06-19 ENCOUNTER — Telehealth: Payer: Self-pay | Admitting: Internal Medicine

## 2022-06-19 NOTE — Telephone Encounter (Signed)
Called and notified patient of response. Nothing further needed at this time.

## 2022-06-19 NOTE — Telephone Encounter (Signed)
Yes that's fine 

## 2022-06-19 NOTE — Telephone Encounter (Signed)
Patient would like the nurse to call regarding her Symbicort medication.  She stated the pharmacy will not have her script for the 80 mg for about a week and she wanted to know if she could take the 100 mg that she currently has.  Please advise and call patient to discuss at [phone]

## 2022-06-19 NOTE — Telephone Encounter (Signed)
Patient would like the nurse to call regarding her Symbicort medication.  She stated the pharmacy will not have her script for the 80 mg for about a week and she wanted to know if she could take the 100 mg that she currently has.  Please advise and call patient to discuss at [phone]

## 2022-06-26 ENCOUNTER — Telehealth: Payer: Self-pay | Admitting: Internal Medicine

## 2022-06-26 NOTE — Telephone Encounter (Signed)
Can we run a ticket on patients insurance to see what's covered?  Please route message back to triage 

## 2022-06-27 ENCOUNTER — Other Ambulatory Visit (HOSPITAL_COMMUNITY): Payer: Self-pay

## 2022-06-27 NOTE — Telephone Encounter (Signed)
Per test claims alternatives to Symbicort covered under the patients plan at this time are as follows:   Wixela- $0.00 Brand Advair HFA- $0.00 Dulera- $0.00 Breo- $0.00

## 2022-06-27 NOTE — Telephone Encounter (Signed)
Pt called again checking to see if there are any other alternatives to the Symbicort. Pt is currently out of the Symbicort. Routing to prior [place] team as high priority. Please advise.

## 2022-06-27 NOTE — Telephone Encounter (Signed)
  Per test claims alternatives to Symbicort covered under the patients plan at this time are as follows:    Wixela- $0.00 Brand Advair HFA- $0.00 Dulera- $0.00 Breo- $0.00

## 2022-06-28 ENCOUNTER — Ambulatory Visit (INDEPENDENT_AMBULATORY_CARE_PROVIDER_SITE_OTHER): Payer: No Typology Code available for payment source | Admitting: Family Medicine

## 2022-06-28 ENCOUNTER — Other Ambulatory Visit: Payer: Self-pay

## 2022-06-28 ENCOUNTER — Encounter: Payer: Self-pay | Admitting: Family Medicine

## 2022-06-28 VITALS — BP 110/71 | HR 79 | Ht 63.0 in | Wt 182.0 lb

## 2022-06-28 DIAGNOSIS — F17200 Nicotine dependence, unspecified, uncomplicated: Secondary | ICD-10-CM | POA: Insufficient documentation

## 2022-06-28 DIAGNOSIS — Z87891 Personal history of nicotine dependence: Secondary | ICD-10-CM

## 2022-06-28 DIAGNOSIS — E038 Other specified hypothyroidism: Secondary | ICD-10-CM | POA: Diagnosis not present

## 2022-06-28 DIAGNOSIS — I10 Essential (primary) hypertension: Secondary | ICD-10-CM | POA: Diagnosis not present

## 2022-06-28 DIAGNOSIS — J45991 Cough variant asthma: Secondary | ICD-10-CM | POA: Diagnosis not present

## 2022-06-28 DIAGNOSIS — Z1231 Encounter for screening mammogram for malignant neoplasm of breast: Secondary | ICD-10-CM

## 2022-06-28 DIAGNOSIS — E559 Vitamin D deficiency, unspecified: Secondary | ICD-10-CM

## 2022-06-28 DIAGNOSIS — F17211 Nicotine dependence, cigarettes, in remission: Secondary | ICD-10-CM

## 2022-06-28 DIAGNOSIS — E785 Hyperlipidemia, unspecified: Secondary | ICD-10-CM | POA: Diagnosis not present

## 2022-06-28 MED ORDER — MOMETASONE FURO-FORMOTEROL FUM 100-5 MCG/ACT IN AERO: 2.0000 | INHALATION_SPRAY | Freq: Two times a day (BID) | RESPIRATORY_TRACT | 0 refills | Status: DC

## 2022-06-28 MED ORDER — CLOTRIMAZOLE-BETAMETHASONE 1-0.05 % EX CREA: TOPICAL_CREAM | CUTANEOUS | 1 refills | Status: DC

## 2022-06-28 NOTE — Patient Instructions (Addendum)
Annual exam in October, call if you need me sooner  Please schedule mammogram at checkout  Please get fasting Lipid, cmp and EGFr, tSH and vit D 3 to 5   NEED Covid vaccine and RSV vaccine at your pharmacy, both are due, do not get them the same day  Work on food choice , reduce white foods and increase colored foods  Caffeine needs to be reduced to improve reflux and cough   Congrats on starting exercise.  Follow good habits that you have decided on  Thanks for choosing [facility], we consider it a privelige to serve you.

## 2022-06-28 NOTE — Telephone Encounter (Signed)
Patient stopped by the desk and wanted to know status of inhaler. Please call patient when medication is sent in to [facility].

## 2022-06-28 NOTE — Assessment & Plan Note (Signed)
Remains nicotine free in 06/2022,  does not qualify for screening

## 2022-06-28 NOTE — Telephone Encounter (Signed)
Called and spoke w/ pt let her know that I was sending in the [facility] inhaler per MW and instructed her on the use. She verbalized understanding. NFN att.

## 2022-06-30 NOTE — Assessment & Plan Note (Signed)
Controlled, no change in medication DASH diet and commitment to daily physical activity for a minimum of 30 minutes discussed and encouraged, as a part of hypertension management. The importance of attaining a healthy weight is also discussed.     06/28/2022    1:04 PM 06/18/2022   11:28 AM 05/07/2022   11:47 AM 04/10/2022    3:50 PM 03/26/2022   10:19 AM 12/21/2021    1:10 PM 08/08/2021   10:23 AM  BP/Weight  Systolic BP 110 132 132 132 128 119 108  Diastolic BP 71 68 76 78 84 78 54  Wt. (Lbs) 182.04 180.6 183.2 180.8 181.4 175   BMI 32.25 kg/m2 31.99 kg/m2 32.45 kg/m2 32.03 kg/m2 32.13 kg/m2 31 kg/m2

## 2022-06-30 NOTE — Assessment & Plan Note (Signed)
Hyperlipidemia:Low fat diet discussed and encouraged.   Lipid Panel  Lab Results  Component Value Date   CHOL 173 12/21/2021   HDL 72 12/21/2021   LDLCALC 86 12/21/2021   TRIG 83 12/21/2021   CHOLHDL 2.4 12/21/2021     Updated lab needed at/ before next visit.

## 2022-06-30 NOTE — Assessment & Plan Note (Signed)
Updated lab needed at/ before next visit. Controlled when checked

## 2022-06-30 NOTE — Progress Notes (Signed)
Rebecca Rodgers     MRN: [id number]      DOB: 06-12-1955   HPI Rebecca Rodgers is here for follow up and re-evaluation of chronic medical conditions, medication management and review of any available recent lab and radiology data.  Preventive health is updated, specifically  Cancer screening and Immunization.   Questions or concerns regarding consultations or procedures which the PT has had in the interim are  addressed.Working with Pulmonary on management of her asthma, has started reflux meds and states cough is improved, has no maintenance inhaler currently as prescribed med not covered by her ins Will work on healthy lifestyle and weight loss There are no specific complaints   ROS Denies recent fever or chills. Denies sinus pressure, nasal congestion, ear pain or sore throat. Denies chest congestion, productive cough or wheezing. Denies chest pains, palpitations and leg swelling Denies abdominal pain, nausea, vomiting,diarrhea or constipation.   Denies dysuria, frequency, hesitancy or incontinence. Denies uncontrolled joint pain, swelling and limitation in mobility. Denies headaches, seizures, numbness, or tingling. Chronic  depression and anxiety not fully treated managed by Psych and not at goal . Denies skin break down or rash.   PE  BP 110/71 (BP Location: Right Arm, Patient Position: Sitting, Cuff Size: Large)   Pulse 79   Ht 5\' 3"  (1.6 m)   Wt 182 lb 0.6 oz (82.6 kg)   SpO2 97%   BMI 32.25 kg/m   Patient alert and oriented and in no cardiopulmonary distress.  HEENT: No facial asymmetry, EOMI,     Neck supple .  Chest: Clear to auscultation bilaterally.  CVS: S1, S2 no murmurs, no S3.Regular rate.  ABD: Soft non tender.   Ext: No edema  MS: Adequate ROM spine, shoulders, hips and knees.  Skin: Intact, no ulcerations or rash noted.  Psych: Good eye contact, normal affect. Memory intact not anxious or depressed appearing.  CNS: CN 2-12 intact, power,  normal  throughout.no focal deficits noted.   Assessment & Plan  History of nicotine dependence Remains nicotine free in 06/2022,  does not qualify for screening  Essential hypertension Controlled, no change in medication DASH diet and commitment to daily physical activity for a minimum of 30 minutes discussed and encouraged, as a part of hypertension management. The importance of attaining a healthy weight is also discussed.     06/28/2022    1:04 PM 06/18/2022   11:28 AM 05/07/2022   11:47 AM 04/10/2022    3:50 PM 03/26/2022   10:19 AM 12/21/2021    1:10 PM 08/08/2021   10:23 AM  BP/Weight  Systolic BP 110 132 132 132 128 119 108  Diastolic BP 71 68 76 78 84 78 54  Wt. (Lbs) 182.04 180.6 183.2 180.8 181.4 175   BMI 32.25 kg/m2 31.99 kg/m2 32.45 kg/m2 32.03 kg/m2 32.13 kg/m2 31 kg/m2        Cough variant asthma with UACS component Reports improvement in cough, not that she is on meds for GERD  and following lifestyle modification re caffeine reduce , elevation of HOB. Of  note not on preventive inhaler currently as med not covered, will f/u on this with Pulmonary who is treating her asthma  Hypothyroidism Updated lab needed at/ before next visit. Controlled when checked  Hyperlipidemia Hyperlipidemia:Low fat diet discussed and encouraged.   Lipid Panel  Lab Results  Component Value Date   CHOL 173 12/21/2021   HDL 72 12/21/2021   LDLCALC 86 12/21/2021   TRIG 83  12/21/2021   CHOLHDL 2.4 12/21/2021     Updated lab needed at/ before next visit.

## 2022-06-30 NOTE — Assessment & Plan Note (Signed)
Reports improvement in cough, not that she is on meds for GERD  and following lifestyle modification re caffeine reduce , elevation of HOB. Of  note not on preventive inhaler currently as med not covered, will f/u on this with Pulmonary who is treating her asthma

## 2022-07-02 ENCOUNTER — Other Ambulatory Visit (HOSPITAL_COMMUNITY): Payer: Self-pay

## 2022-07-02 ENCOUNTER — Telehealth: Payer: Self-pay | Admitting: *Deleted

## 2022-07-02 NOTE — Telephone Encounter (Signed)
Called and spoke with the pt  She is c/o cough with white sputum- unchanged from recent visit  She is asking if Dr Sherene Sires recommends that she try mucinex to help with this  Not having any new symptoms  Please advise, thanks!  Allergies  Allergen Reactions   Codeine     Lip swelling    Penicillins     Lip swelling Has patient had a PCN reaction causing immediate rash, facial/tongue/throat swelling, SOB or lightheadedness with hypotension: Yes Has patient had a PCN reaction causing severe rash involving mucus membranes or skin necrosis: No Has patient had a PCN reaction that required hospitalization No Has patient had a PCN reaction occurring within the last 10 years: No If all of the above answers are "NO", then may proceed with Cephalosporin use.

## 2022-07-02 NOTE — Telephone Encounter (Signed)
Called pt and there was no answer-LMTCB °

## 2022-07-02 NOTE — Telephone Encounter (Signed)
Patient calling to see if Dr. Sherene Sires recommended her to take mucus pills (she did not specify which) or not. Please call and advise patient.

## 2022-07-02 NOTE — Telephone Encounter (Signed)
Mucinex dm 1200 mg every 12 hours as needed   If not better ov with all meds in hand to regroup

## 2022-07-11 NOTE — Telephone Encounter (Signed)
Spoke with the pt and notified of response per MW  She verbalized understanding  Nothing further needed 

## 2022-07-18 ENCOUNTER — Encounter: Payer: Self-pay | Admitting: Physician Assistant

## 2022-07-18 ENCOUNTER — Ambulatory Visit: Payer: Medicare HMO | Admitting: Physician Assistant

## 2022-07-18 DIAGNOSIS — G4700 Insomnia, unspecified: Secondary | ICD-10-CM | POA: Diagnosis not present

## 2022-07-18 DIAGNOSIS — F3178 Bipolar disorder, in full remission, most recent episode mixed: Secondary | ICD-10-CM | POA: Diagnosis not present

## 2022-07-18 MED ORDER — OLANZAPINE 7.5 MG PO TABS: 7.5000 mg | ORAL_TABLET | Freq: Every day | ORAL | 5 refills | Status: DC

## 2022-07-18 NOTE — Patient Instructions (Signed)
New directions for Zyprexa (Olanzapine) 5 mg.  Take 1 1/2 pills every night until you're out of your current supply. Then get the new prescription that I sent to the pharmacy today. It is for 7.5 mg, and you'll take 1 pill every night.  No other changes.

## 2022-07-18 NOTE — Progress Notes (Signed)
[facility]  Patient ID: TAMU GOLZ,  MRN: [id number]  PCP: Kerri Perches, MD  Date of Evaluation: 07/18/2022 Time spent:20 minutes  Chief Complaint:  Chief Complaint   Depression; Insomnia; Follow-up    HISTORY/CURRENT STATUS: HPI For 6 mo med check.  Doing well for the most part with her mental health. Patient is able to enjoy things.  Energy and motivation are good.   No extreme sadness, tearfulness, or feelings of hopelessness.  Sleeps well most of the time. Trazodone helps. ADLs and personal hygiene are normal.   Denies any changes in concentration, making decisions, or remembering things.  Appetite has not changed.  Weight is stable.  Denies suicidal or homicidal thoughts.  Has been having more dark thoughts, like afraid something bad is going to happen. Not having PA. No hallucinations but is a little paranoid, going on for awhile but she never told anybody. She doesn't like to drive anywhere now.  Only a 15 mile radius of home.   Patient denies increased energy with decreased need for sleep, increased talkativeness, racing thoughts, impulsivity or risky behaviors, increased spending, increased libido, grandiosity, increased irritability or anger, paranoia, or hallucinations.  Denies dizziness, syncope, seizures, numbness, tingling, tremor, tics, unsteady gait, slurred speech, confusion.  Denies dystonia. Does have bilat knee pain from OA. Denies unexplained weight loss, frequent infections, or sores that heal slowly.  No polyphagia, polydipsia, or polyuria. Denies visual changes or paresthesias.   Individual Medical History/ Review of Systems: Changes? :Yes    has seen pulmonology for cough, doing better with new meds. See on chart.   Past medications for mental health diagnoses include: Lamictal, Trazodone, Zyprexa, Wellbutrin, others she can't remember.   Allergies: Codeine and Penicillins  Current Medications:  Current Outpatient Medications:     acetaminophen (TYLENOL) 500 MG tablet, Take 500 mg by mouth every 6 (six) hours as needed (pain)., Disp: , Rfl:    albuterol (VENTOLIN HFA) 108 (90 Base) MCG/ACT inhaler, INHALE 2 PUFFS INTO THE LUNGS EVERY 6 (SIX) HOURS AS NEEDED FOR WHEEZING OR SHORTNESS OF BREATH., Disp: 1 each, Rfl: 0   amLODipine (NORVASC) 10 MG tablet, TAKE 1 TABLET EVERY DAY, Disp: 90 tablet, Rfl: 1   buPROPion (WELLBUTRIN XL) 150 MG 24 hr tablet, TAKE 1 TABLET EVERY DAY, Disp: 90 tablet, Rfl: 1   Cholecalciferol (VITAMIN D) 125 MCG (5000 UT) CAPS, Take 5,000 Units by mouth every Tuesday., Disp: , Rfl:    clotrimazole-betamethasone (LOTRISONE) cream, APPLY TO AFFECTED AREAS TWICE A DAY, Disp: 45 g, Rfl: 1   ezetimibe (ZETIA) 10 MG tablet, TAKE 1 TABLET EVERY DAY, Disp: 90 tablet, Rfl: 0   famotidine (PEPCID) 20 MG tablet, Take 1 tablet (20 mg total) by mouth 2 (two) times daily. After breakfast and supper, Disp: 90 tablet, Rfl: 1   lamoTRIgine (LAMICTAL) 200 MG tablet, TAKE 1 TABLET EVERY DAY, Disp: 90 tablet, Rfl: 0   levothyroxine (SYNTHROID) 50 MCG tablet, TAKE 1 TABLET DAILY MONDAY THROUGH SATURDAY AND TAKE 1 AND 1/2 TABLETS ON SUNDAY, Disp: 97 tablet, Rfl: 0   mometasone-formoterol (DULERA) 100-5 MCG/ACT AERO, Inhale 2 puffs into the lungs in the morning and at bedtime., Disp: 8.8 g, Rfl: 0   montelukast (SINGULAIR) 10 MG tablet, TAKE 1 TABLET AT BEDTIME, Disp: 90 tablet, Rfl: 1   OLANZapine (ZYPREXA) 7.5 MG tablet, Take 1 tablet (7.5 mg total) by mouth at bedtime., Disp: 30 tablet, Rfl: 5   omeprazole (PRILOSEC) 40 MG capsule, Take 30-60 min  before first meal of the day, Disp: 30 capsule, Rfl: 11   rosuvastatin (CRESTOR) 40 MG tablet, TAKE 1 TABLET EVERY DAY, Disp: 90 tablet, Rfl: 0   traZODone (DESYREL) 100 MG tablet, TAKE 1 TO 2 TABLETS (100 TO 200 MG TOTAL) AT BEDTIME AS NEEDED FOR SLEEP., Disp: 180 tablet, Rfl: 1   triamterene-hydrochlorothiazide (MAXZIDE-25) 37.5-25 MG tablet, TAKE 1 TABLET EVERY DAY, Disp: 90  tablet, Rfl: 0   vitamin C (ASCORBIC ACID) 500 MG tablet, Take 500 mg by mouth daily., Disp: , Rfl:    VOLTAREN 1 % GEL, APPLY TO THE AFFECTED AREA(S) TWICE A DAY AS NEEDED FOR PAIN (Patient taking differently: 2 g daily as needed (pain).), Disp: 100 g, Rfl: 0   famotidine (PEPCID) 20 MG tablet, One after supper, Disp: 30 tablet, Rfl: 11 Medication Side Effects: none  Family Medical/ Social History: Changes? No  MENTAL HEALTH EXAM:  There were no vitals taken for this visit.There is no height or weight on file to calculate BMI.  General Appearance: Casual, Well Groomed, and Obese  Eye Contact:  Good  Speech:  Clear and Coherent and Normal Rate  Volume:  Normal  Mood:  Euthymic  Affect:  Congruent  Thought Process:  Goal Directed and Descriptions of Associations: Circumstantial  Orientation:  Full (Time, Place, and Person)  Thought Content: Paranoid Ideation   Suicidal Thoughts:  No  Homicidal Thoughts:  No  Memory:  WNL  Judgement:  Good  Insight:  Good  Psychomotor Activity:  Normal  Concentration:  Concentration: Good  Recall:  Good  Fund of Knowledge: Good  Language: Good  Assets:  Desire for Improvement  ADL's:  Intact  Cognition: WNL  Prognosis:  Good   DIAGNOSES:    ICD-10-CM   1. Bipolar disorder, in full remission, most recent episode mixed  F31.78     2. Insomnia, unspecified type  G47.00       Receiving Psychotherapy: No   RECOMMENDATIONS:  PDMP was reviewed.  No controlled substances. I provided 20 minutes of face to face time during this encounter, including time spent before and after the visit in records review, medical decision making, counseling pertinent to today's visit, and charting.   We discussed to the anxiety she is experiencing.  It is difficult to know what any trigger may be, but I feel that the Zyprexa needs to be increased.  She would like to try it.  Continue Wellbutrin XL 150 mg, 1 p.o. daily. Continue Lamictal 200 mg, 1 p.o.  daily. Increase Zyprexa to 7.5 mg. Continue trazodone 100 mg, 1-2 p.o. nightly as needed sleep.   Recommend counseling. Return in 2 months.   Melony Overly, PA-C

## 2022-07-23 ENCOUNTER — Other Ambulatory Visit: Payer: Self-pay

## 2022-07-23 ENCOUNTER — Telehealth: Payer: Self-pay | Admitting: Physician Assistant

## 2022-07-23 ENCOUNTER — Telehealth: Payer: Self-pay | Admitting: Family Medicine

## 2022-07-23 DIAGNOSIS — F3178 Bipolar disorder, in full remission, most recent episode mixed: Secondary | ICD-10-CM

## 2022-07-23 MED ORDER — LAMOTRIGINE 200 MG PO TABS: 200.0000 mg | ORAL_TABLET | Freq: Every day | ORAL | 0 refills | Status: DC

## 2022-07-23 MED ORDER — AMLODIPINE BESYLATE 10 MG PO TABS: 10.0000 mg | ORAL_TABLET | Freq: Every day | ORAL | 1 refills | Status: DC

## 2022-07-23 MED ORDER — TRAZODONE HCL 100 MG PO TABS: ORAL_TABLET | ORAL | 1 refills | Status: DC

## 2022-07-23 MED ORDER — ROSUVASTATIN CALCIUM 40 MG PO TABS: 40.0000 mg | ORAL_TABLET | Freq: Every day | ORAL | 0 refills | Status: DC

## 2022-07-23 MED ORDER — MONTELUKAST SODIUM 10 MG PO TABS: 10.0000 mg | ORAL_TABLET | Freq: Every day | ORAL | 1 refills | Status: DC

## 2022-07-23 MED ORDER — TRIAMTERENE-HCTZ 37.5-25 MG PO TABS: 1.0000 | ORAL_TABLET | Freq: Every day | ORAL | 0 refills | Status: DC

## 2022-07-23 MED ORDER — ALBUTEROL SULFATE HFA 108 (90 BASE) MCG/ACT IN AERS: 2.0000 | INHALATION_SPRAY | Freq: Four times a day (QID) | RESPIRATORY_TRACT | 0 refills | Status: DC | PRN

## 2022-07-23 MED ORDER — BUPROPION HCL ER (XL) 150 MG PO TB24: 150.0000 mg | ORAL_TABLET | Freq: Every day | ORAL | 1 refills | Status: DC

## 2022-07-23 MED ORDER — LEVOTHYROXINE SODIUM 50 MCG PO TABS: ORAL_TABLET | ORAL | 0 refills | Status: DC

## 2022-07-23 MED ORDER — EZETIMIBE 10 MG PO TABS: 10.0000 mg | ORAL_TABLET | Freq: Every day | ORAL | 0 refills | Status: DC

## 2022-07-23 MED ORDER — CLOTRIMAZOLE-BETAMETHASONE 1-0.05 % EX CREA: TOPICAL_CREAM | CUTANEOUS | 1 refills | Status: DC

## 2022-07-23 NOTE — Telephone Encounter (Signed)
Patient not on lorazepam. Has RF of olanzapine already. Rx for the others sent to requested pharmacy.

## 2022-07-23 NOTE — Telephone Encounter (Signed)
Requesting refills on Trazodone 200 mg, Lamotrigine 200 mg, Lorazepam, Bupropion 150 mg and Olanzapine 7.5 mg.   [facility] [place], [place] - [address]   Phone: [phone]  Fax: [phone]

## 2022-07-23 NOTE — Telephone Encounter (Signed)
Prescription Request  07/23/2022  LOV: 06/28/2022  What is the name of the medication or equipment? albuterol (VENTOLIN HFA) 108 (90 Base) MCG/ACT inhaler [   amLODipine (NORVASC) 10 MG tablet   clotrimazole-betamethasone (LOTRISONE) cream   ezetimibe (ZETIA) 10 MG tablet   levothyroxine (SYNTHROID) 50 MCG tablet   montelukast (SINGULAIR) 10 MG tablet   rosuvastatin (CRESTOR) 40 MG tablet   triamterene-hydrochlorothiazide (MAXZIDE-25) 37.5-25 MG tablet   VOLTAREN 1 % GEL   Have you contacted your pharmacy to request a refill? Yes   Which pharmacy would you like this sent to?   [facility] [place], [place] - [address] [place] [address] Phone: [phone] Fax: [phone]      Patient notified that their request is being sent to the clinical staff for review and that they should receive a response within 2 business days.   Please advise at Mobile [phone] (mobile)

## 2022-07-23 NOTE — Telephone Encounter (Signed)
Refills sent to pharmacy. 

## 2022-07-24 ENCOUNTER — Telehealth: Payer: Self-pay | Admitting: *Deleted

## 2022-07-24 NOTE — Telephone Encounter (Signed)
Patient calling to get a refill for her [facility] inhaler. Patient is not out yet (she still has 90 puffs left) but would like the refill sent over to [facility] for her to pick up when she runs low.  Call if needed for clarification [phone]

## 2022-07-25 ENCOUNTER — Other Ambulatory Visit: Payer: Self-pay

## 2022-07-25 MED ORDER — MOMETASONE FURO-FORMOTEROL FUM 100-5 MCG/ACT IN AERO: 2.0000 | INHALATION_SPRAY | Freq: Two times a day (BID) | RESPIRATORY_TRACT | 0 refills | Status: DC

## 2022-07-25 NOTE — Telephone Encounter (Signed)
Called and spoke with pt letting her know I have refilled her Dulera inhaler. She verbalized understanding. NFN att.

## 2022-09-02 ENCOUNTER — Ambulatory Visit (INDEPENDENT_AMBULATORY_CARE_PROVIDER_SITE_OTHER): Payer: No Typology Code available for payment source

## 2022-09-02 DIAGNOSIS — Z0000 Encounter for general adult medical examination without abnormal findings: Secondary | ICD-10-CM

## 2022-09-02 NOTE — Patient Instructions (Signed)
  Rebecca Rodgers , Thank you for taking time to come for your Medicare Wellness Visit. I appreciate your ongoing commitment to your health goals. Please review the following plan we discussed and let me know if I can assist you in the future.   These are the goals we discussed:  Goals      decrease carbohydrates     Recommend decreasing simple carbohydrates.     Exercise 150 min/wk Moderate Activity     I plan to go to the gym      LIFESTYLE - DECREASE FALLS RISK     Patient Stated     Patient states that her goal is to be better and learn to live alone.     Patient Stated     Keep being active.        This is a list of the screening recommended for you and due dates:  Health Maintenance  Topic Date Due   COVID-19 Vaccine (5 - 2023-24 season) 11/23/2021   Flu Shot  10/24/2022   Mammogram  01/03/2023   Medicare Annual Wellness Visit  09/02/2023   Colon Cancer Screening  08/09/2026   DTaP/Tdap/Td vaccine (3 - Td or Tdap) 09/18/2026   Pneumonia Vaccine  Completed   DEXA scan (bone density measurement)  Completed   Hepatitis C Screening  Completed   Zoster (Shingles) Vaccine  Completed   HPV Vaccine  Aged Out   Screening for Lung Cancer  Discontinued

## 2022-09-02 NOTE — Progress Notes (Signed)
Subjective:   Rebecca Rodgers is a 67 y.o. female who presents for Medicare Annual (Subsequent) preventive examination.  Review of Systems    I connected with  Rebecca Rodgers on 09/02/22 by a audio enabled telemedicine application and verified that I am speaking with the correct person using two identifiers.  Patient Location: Home  Provider Location: Office/Clinic  I discussed the limitations of evaluation and management by telemedicine. The patient expressed understanding and agreed to proceed.        Objective:    There were no vitals filed for this visit. There is no height or weight on file to calculate BMI.     08/27/2021   10:33 AM 08/08/2021    7:50 AM 08/06/2021    1:33 PM 08/23/2020   11:14 AM 01/26/2018    1:13 PM 07/23/2017   10:37 AM 06/27/2016    9:33 AM  Advanced Directives  Does Patient Have a Medical Advance Directive? No No No No Yes Yes No  Does patient want to make changes to medical advance directive?    No - Patient declined  No - Patient declined   Would patient like information on creating a medical advance directive? Yes (ED - Information included in AVS) No - Patient declined No - Patient declined        Current Medications (verified) Outpatient Encounter Medications as of 09/02/2022  Medication Sig   acetaminophen (TYLENOL) 500 MG tablet Take 500 mg by mouth every 6 (six) hours as needed (pain).   albuterol (VENTOLIN HFA) 108 (90 Base) MCG/ACT inhaler Inhale 2 puffs into the lungs every 6 (six) hours as needed for wheezing or shortness of breath.   amLODipine (NORVASC) 10 MG tablet Take 1 tablet (10 mg total) by mouth daily.   buPROPion (WELLBUTRIN XL) 150 MG 24 hr tablet Take 1 tablet (150 mg total) by mouth daily.   Cholecalciferol (VITAMIN D) 125 MCG (5000 UT) CAPS Take 5,000 Units by mouth every Tuesday.   clotrimazole-betamethasone (LOTRISONE) cream APPLY TO AFFECTED AREAS TWICE A DAY   ezetimibe (ZETIA) 10 MG tablet Take 1 tablet (10 mg total) by  mouth daily.   famotidine (PEPCID) 20 MG tablet One after supper   famotidine (PEPCID) 20 MG tablet Take 1 tablet (20 mg total) by mouth 2 (two) times daily. After breakfast and supper   lamoTRIgine (LAMICTAL) 200 MG tablet Take 1 tablet (200 mg total) by mouth daily.   levothyroxine (SYNTHROID) 50 MCG tablet TAKE 1 TABLET DAILY MONDAY THROUGH SATURDAY AND TAKE 1 AND 1/2 TABLETS ON SUNDAY   mometasone-formoterol (DULERA) 100-5 MCG/ACT AERO Inhale 2 puffs into the lungs in the morning and at bedtime.   montelukast (SINGULAIR) 10 MG tablet Take 1 tablet (10 mg total) by mouth at bedtime.   OLANZapine (ZYPREXA) 7.5 MG tablet Take 1 tablet (7.5 mg total) by mouth at bedtime.   omeprazole (PRILOSEC) 40 MG capsule Take 30-60 min before first meal of the day   rosuvastatin (CRESTOR) 40 MG tablet Take 1 tablet (40 mg total) by mouth daily.   traZODone (DESYREL) 100 MG tablet TAKE 1 TO 2 TABLETS (100 TO 200 MG TOTAL) AT BEDTIME AS NEEDED FOR SLEEP.   triamterene-hydrochlorothiazide (MAXZIDE-25) 37.5-25 MG tablet Take 1 tablet by mouth daily.   vitamin C (ASCORBIC ACID) 500 MG tablet Take 500 mg by mouth daily.   VOLTAREN 1 % GEL APPLY TO THE AFFECTED AREA(S) TWICE A DAY AS NEEDED FOR PAIN (Patient taking differently: 2 g  daily as needed (pain).)   No facility-administered encounter medications on file as of 09/02/2022.    Allergies (verified) Codeine and Penicillins   History: Past Medical History:  Diagnosis Date   Anxiety    Asthma    Depression    Hx of hysterectomy    Hyperlipidemia    Hypertension    Hypothyroidism    Suicide attempt (HCC) 1997   when a relationship broke up     Suicide attempt (HCC)    3 months ago because of ill health of her parents    Uterine cancer (HCC) 2005   Past Surgical History:  Procedure Laterality Date   ABDOMINAL HYSTERECTOMY     CARPAL TUNNEL RELEASE  1998   bilateral     COLONOSCOPY  12/20/2010   Procedure: COLONOSCOPY;  Surgeon: Malissa Hippo,  MD;  [facility]: [facility];  Service: Endoscopy;  Laterality: N/A;   COLONOSCOPY N/A 06/27/2016   Procedure: COLONOSCOPY;  Surgeon: Malissa Hippo, MD;  [facility]: [facility];  Service: Endoscopy;  Laterality: N/A;  1030   COLONOSCOPY WITH PROPOFOL N/A 08/08/2021   Procedure: COLONOSCOPY WITH PROPOFOL;  Surgeon: Malissa Hippo, MD;  Location: [facility];  Service: Endoscopy;  Laterality: N/A;  955   DILATION AND CURETTAGE OF UTERUS     at 20    POLYPECTOMY  08/08/2021   Procedure: POLYPECTOMY INTESTINAL;  Surgeon: Malissa Hippo, MD;  Location: [facility];  Service: Endoscopy;;   VESICOVAGINAL FISTULA CLOSURE W/ TAH  2002   dur to uterine cancer    Family History  Problem Relation Age of Onset   Cataracts Mother    Hypertension Mother    Hyperlipidemia Mother    Dementia Mother    Depression Mother    Alzheimer's disease Mother    Coronary artery disease Father    Hyperlipidemia Father    Stroke Father    CVA Father    Cancer Sister 57       breast    Cancer Brother 105       prostate   Social History   Socioeconomic History   Marital status: Single    Spouse name: Not on file   Number of children: Not on file   Years of education: Not on file   Highest education level: Not on file  Occupational History   Occupation: disabled  Tobacco Use   Smoking status: Former    Packs/day: 0.25    Years: 15.00    Additional pack years: 0.00    Total pack years: 3.75    Types: Cigarettes    Quit date: 03/09/2013    Years since quitting: 9.4   Smokeless tobacco: Former  Building services engineer Use: Former  Substance and Sexual Activity   Alcohol use: No    Alcohol/week: 0.0 standard drinks of alcohol   Drug use: No   Sexual activity: Not Currently    Birth control/protection: Other-see comments, Surgical  Other Topics Concern   Not on file  Social History Narrative   Partner passed in the summer of 2018. This has been very difficult on her.    Social Determinants  of Health   Financial Resource Strain: Low Risk  (08/23/2020)   Overall Financial Resource Strain (CARDIA)    Difficulty of Paying Living Expenses: Not hard at all  Food Insecurity: No Food Insecurity (08/23/2020)   Hunger Vital Sign    Worried About Running Out of Food in the Last  Year: Never true    Ran Out of Food in the Last Year: Never true  Transportation Needs: No Transportation Needs (08/23/2020)   PRAPARE - Administrator, Civil Service (Medical): No    Lack of Transportation (Non-Medical): No  Physical Activity: Insufficiently Active (08/23/2020)   Exercise Vital Sign    Days of Exercise per Week: 7 days    Minutes of Exercise per Session: 20 min  Stress: No Stress Concern Present (08/23/2020)   [employer] of Occupational Health - Occupational Stress Questionnaire    Feeling of Stress : Not at all  Social Connections: Socially Isolated (08/23/2020)   Social Connection and Isolation Panel [NHANES]    Frequency of Communication with Friends and Family: More than three times a week    Frequency of Social Gatherings with Friends and Family: More than three times a week    Attends Religious Services: Never    Database administrator or Organizations: No    Attends Banker Meetings: Never    Marital Status: Never married    Tobacco Counseling Counseling given: Not Answered   Clinical Intake:                 Diabetic? No         Activities of Daily Living     No data to display           Patient Care Team: Kerri Perches, MD as PCP - General Tenny Craw Mellody Drown, MD as Consulting Physician [facility])  Indicate any recent Medical Services you may have received from other than [facility] providers in the past year (date may be approximate).     Assessment:   This is a routine wellness examination for Rebecca Rodgers.  Hearing/Vision screen No results found.  Dietary issues and exercise activities discussed:     Goals  Addressed   None   Depression Screen    06/28/2022    1:07 PM 12/21/2021    1:13 PM 08/27/2021   10:34 AM 06/06/2021    1:12 PM 12/20/2020    2:15 PM 08/23/2020   11:18 AM 12/15/2019    8:35 PM  PHQ 2/9 Scores  PHQ - 2 Score 3 0 0 0 0 0 0  PHQ- 9 Score 13          Fall Risk    06/28/2022    1:07 PM 12/21/2021    1:13 PM 08/27/2021   10:33 AM 06/06/2021    1:12 PM 12/20/2020    2:37 PM  Fall Risk   Falls in the past year? 0 1 0 1 1  Number falls in past yr: 0 0 0 0 1  Injury with Fall? 0 1 0 0 0  Risk for fall due to : No Fall Risks History of fall(s) No Fall Risks No Fall Risks   Follow up Falls evaluation completed Falls evaluation completed Falls evaluation completed Falls evaluation completed     FALL RISK PREVENTION PERTAINING TO THE HOME:  Any stairs in or around the home? No  If so, are there any without handrails? No  Home free of loose throw rugs in walkways, pet beds, electrical cords, etc? Yes  Adequate lighting in your home to reduce risk of falls? Yes   ASSISTIVE DEVICES UTILIZED TO PREVENT FALLS:  Life alert? No  Use of a cane, walker or w/c? No  Grab bars in the bathroom? Yes  Shower chair or bench in shower? No  Elevated  toilet seat or a handicapped toilet? No           08/27/2021   10:37 AM 08/23/2020   11:22 AM 08/02/2019    1:24 PM 07/28/2018    2:44 PM 07/23/2017   10:40 AM  6CIT Screen  What Year? 0 points  0 points 0 points 0 points  What month? 0 points  0 points 0 points 0 points  What time? 0 points 0 points 0 points 0 points 0 points  Count back from 20 0 points 0 points 0 points 0 points 0 points  Months in reverse 0 points 0 points 0 points 0 points 0 points  Repeat phrase 2 points 4 points 2 points 2 points 0 points  Total Score 2 points  2 points 2 points 0 points    Immunizations Immunization History  Administered Date(s) Administered   Fluad Quad(high Dose 65+) 12/21/2021   Influenza Split 01/08/2005, 01/08/2012, 12/08/2013, 01/06/2015    Influenza Whole 12/29/2008, 11/28/2010   Influenza,inj,Quad PF,6+ Mos 01/15/2013, 02/20/2016, 02/19/2017, 02/23/2018, 12/09/2018, 12/15/2019, 12/20/2020   Influenza-Unspecified 12/08/2013   Moderna Covid-19 Vaccine Bivalent Booster 65yrs & up 08/14/2021   Moderna Sars-Covid-2 Vaccination 06/05/2019, 07/21/2019, 03/07/2020   PNEUMOCOCCAL CONJUGATE-20 06/06/2021   Pneumococcal Conjugate-13 10/26/2013   Tdap 03/26/2010, 09/17/2016   Zoster Recombinat (Shingrix) 11/05/2016, 02/25/2017    TDAP status: Up to date  Flu Vaccine status: Up to date  Pneumococcal vaccine status: Up to date  Covid-19 vaccine status: Completed vaccines  Qualifies for Shingles Vaccine? Yes   Zostavax completed Yes   Shingrix Completed?: Yes  Screening Tests Health Maintenance  Topic Date Due   COVID-19 Vaccine (5 - 2023-24 season) 11/23/2021   INFLUENZA VACCINE  10/24/2022   MAMMOGRAM  01/03/2023   Medicare Annual Wellness (AWV)  09/02/2023   Colonoscopy  08/09/2026   DTaP/Tdap/Td (3 - Td or Tdap) 09/18/2026   Pneumonia Vaccine 86+ Years old  Completed   DEXA SCAN  Completed   Hepatitis C Screening  Completed   Zoster Vaccines- Shingrix  Completed   HPV VACCINES  Aged Out   Lung Cancer Screening  Discontinued    Health Maintenance  Health Maintenance Due  Topic Date Due   COVID-19 Vaccine (5 - 2023-24 season) 11/23/2021    Colorectal cancer screening: Type of screening: Colonoscopy. Completed 08/08/21. Repeat every 5 years  Mammogram status: Completed 01/02/22. Repeat every year  Bone Density status: Completed 01/02/22. Results reflect: Bone density results: OSTEOPOROSIS. Repeat every 2 years.  Lung Cancer Screening: (Low Dose CT Chest recommended if Age 83-80 years, 30 pack-year currently smoking OR have quit w/in 15years.) does not qualify.   Lung Cancer Screening Referral: no  Additional Screening:  Hepatitis C Screening: does qualify; Completed 11/29/14  Vision Screening:  Recommended annual ophthalmology exams for early detection of glaucoma and other disorders of the eye. Is the patient up to date with their annual eye exam?  Yes  Who is the provider or what is the name of the office in which the patient attends annual eye exams? [facility] If pt is not established with a provider, would they like to be referred to a provider to establish care?  no .   Dental Screening: Recommended annual dental exams for proper oral hygiene  Community Resource Referral / Chronic Care Management: CRR required this visit?  No   CCM required this visit?  No      Plan:     I have personally reviewed and noted  the following in the patient's chart:   Medical and social history Use of alcohol, tobacco or illicit drugs  Current medications and supplements including opioid prescriptions. Patient is not currently taking opioid prescriptions. Functional ability and status Nutritional status Physical activity Advanced directives List of other physicians Hospitalizations, surgeries, and ER visits in previous 12 months Vitals Screenings to include cognitive, depression, and falls Referrals and appointments  In addition, I have reviewed and discussed with patient certain preventive protocols, quality metrics, and best practice recommendations. A written personalized care plan for preventive services as well as general preventive health recommendations were provided to patient.     Jasper Riling, CMA   09/02/2022

## 2022-09-12 DIAGNOSIS — H524 Presbyopia: Secondary | ICD-10-CM | POA: Diagnosis not present

## 2022-09-12 DIAGNOSIS — H52223 Regular astigmatism, bilateral: Secondary | ICD-10-CM | POA: Diagnosis not present

## 2022-09-12 DIAGNOSIS — H5203 Hypermetropia, bilateral: Secondary | ICD-10-CM | POA: Diagnosis not present

## 2022-09-12 DIAGNOSIS — H35363 Drusen (degenerative) of macula, bilateral: Secondary | ICD-10-CM | POA: Diagnosis not present

## 2022-09-12 DIAGNOSIS — H2513 Age-related nuclear cataract, bilateral: Secondary | ICD-10-CM | POA: Diagnosis not present

## 2022-09-16 NOTE — Progress Notes (Unsigned)
Rebecca Rodgers, female    DOB: [birth year]    MRN: [id number]   Brief patient profile:  67 yo [place] female  with nl pfts 11/14/2008 quit smoking  2014 and on Symbicort shortly p  arrived in [place] in early 2000s  referred to pulmonary clinic in [place]  03/26/2022 by Dr Lodema Hong  for cough onset was while living long term rental house in 2021-22.   Has noticed that much better when not in house for a week.   Nose broken "years ago" forgot ENT name/ intermittent epistaxis since    History of Present Illness  03/26/2022  Pulmonary/ 1st office eval/ Rebecca Rodgers / [name] on Symbicort 160  Chief Complaint  Patient presents with   Consult    Chronic cough ref by PCP   Dyspnea:  no doe  Cough: worse p waking / stirring x 20 min  assoc with slt yellow nasal d/c chronically Sleep: bed is flat/ 2 pillows  SABA use: rarely  02: none  Lung cancer screen: ordered by Dr Lodema Hong not done as of 09/18/2022  Rec For cough > Mucinex dm 1200 mg every 12 hours as needed for cough (over the counter)  Change Symbicort to 80 Take 2 puffs first thing in am and then another 2 puffs about 12 hours later and blow symbicort out thru nose  Stop prilosec and start Pantoprazole (protonix) 40 mg   Take  30-60 min before first meal of the day and Pepcid (famotidine)  20 mg after supper  GERD diet/ bed blocks  referral to sinus CT  > 04/30/22  Please remember to go to the  x-ray department  > did not go Allergy screen 03/26/2022 >  Eos 0.1 /  IgE12        06/18/2022  f/u ov/[facility] [facility]/Rebecca Rodgers re: cough variant asthma vs uacs maint on just pepcid p supper and prn symbicort 80 not sure helping   Chief Complaint  Patient presents with   Follow-up    Doing well   Dyspnea:  sometimes takes symbicort before housework and sometimes before gym  treadmill  Cough: cough is worse at hs  Sleeping: 45 degrees with flat bed/ lots of pillows  SABA use: none 02: none  Rec Symbicort 80  Take 2 puffs first thing in am and  then another 2 puffs about 12 hours later x 2 full weeks and if not better breathing/ coughing try back off x at least a week to establish what if any changes you notice  Prilosec (omeprazole) 40 mg Take 30-60 min before first meal of the day and famotidine 20 mg after supper or bedtime  GERD diet reviewed, bed blocks rec      09/18/2022  3 m f/u ov/ office/Rebecca Rodgers re: AB maint on dulera 100/singulair  and gerd rx  Chief Complaint  Patient presents with   Follow-up  Dyspnea:  ex daily and losing wt  Cough: none  Sleeping: 45 degrees fine  SABA use: none  02: none    Lung cancer screening: ordered again    No obvious day to day or daytime variability or assoc excess/ purulent sputum or mucus plugs or hemoptysis or cp or chest tightness, subjective wheeze or overt sinus or hb symptoms.   Sleeping as above without nocturnal  or early am exacerbation  of respiratory  c/o's or need for noct saba. Also denies any obvious fluctuation of symptoms with weather or environmental changes or other aggravating or alleviating factors except as  outlined above   No unusual exposure hx or h/o childhood pna/ asthma or knowledge of premature birth.  Current Allergies, Complete Past Medical History, Past Surgical History, Family History, and Social History were reviewed in [employer] record.  ROS  The following are not active complaints unless bolded Hoarseness, sore throat, dysphagia, dental problems, itching, sneezing,  nasal congestion or discharge of excess mucus or purulent secretions, ear ache,   fever, chills, sweats, unintended wt loss or wt gain, classically pleuritic or exertional cp,  orthopnea pnd or arm/hand swelling  or leg swelling, presyncope, palpitations, abdominal pain, anorexia, nausea, vomiting, diarrhea  or change in bowel habits or change in bladder habits, change in stools or change in urine, dysuria, hematuria,  rash, arthralgias, visual complaints,  headache, numbness, weakness or ataxia or problems with walking or coordination,  change in mood or  memory.        Current Meds  Medication Sig   acetaminophen (TYLENOL) 500 MG tablet Take 500 mg by mouth every 6 (six) hours as needed (pain).   albuterol (VENTOLIN HFA) 108 (90 Base) MCG/ACT inhaler Inhale 2 puffs into the lungs every 6 (six) hours as needed for wheezing or shortness of breath.   amLODipine (NORVASC) 10 MG tablet Take 1 tablet (10 mg total) by mouth daily.   buPROPion (WELLBUTRIN XL) 150 MG 24 hr tablet Take 1 tablet (150 mg total) by mouth daily.   Cholecalciferol (VITAMIN D) 125 MCG (5000 UT) CAPS Take 5,000 Units by mouth every Tuesday.   clotrimazole-betamethasone (LOTRISONE) cream APPLY TO AFFECTED AREAS TWICE A DAY   ezetimibe (ZETIA) 10 MG tablet Take 1 tablet (10 mg total) by mouth daily.   famotidine (PEPCID) 20 MG tablet One after supper   famotidine (PEPCID) 20 MG tablet Take 1 tablet (20 mg total) by mouth 2 (two) times daily. After breakfast and supper   lamoTRIgine (LAMICTAL) 200 MG tablet Take 1 tablet (200 mg total) by mouth daily.   levothyroxine (SYNTHROID) 50 MCG tablet TAKE 1 TABLET DAILY MONDAY THROUGH SATURDAY AND TAKE 1 AND 1/2 TABLETS ON SUNDAY   mometasone-formoterol (DULERA) 100-5 MCG/ACT AERO Inhale 2 puffs into the lungs in the morning and at bedtime.   montelukast (SINGULAIR) 10 MG tablet Take 1 tablet (10 mg total) by mouth at bedtime.   OLANZapine (ZYPREXA) 7.5 MG tablet Take 1 tablet (7.5 mg total) by mouth at bedtime.   omeprazole (PRILOSEC) 40 MG capsule Take 30-60 min before first meal of the day   rosuvastatin (CRESTOR) 40 MG tablet Take 1 tablet (40 mg total) by mouth daily.   traZODone (DESYREL) 100 MG tablet TAKE 1 TO 2 TABLETS (100 TO 200 MG TOTAL) AT BEDTIME AS NEEDED FOR SLEEP.   triamterene-hydrochlorothiazide (MAXZIDE-25) 37.5-25 MG tablet Take 1 tablet by mouth daily.   vitamin C (ASCORBIC ACID) 500 MG tablet Take 500 mg by mouth  daily.   VOLTAREN 1 % GEL APPLY TO THE AFFECTED AREA(S) TWICE A DAY AS NEEDED FOR PAIN (Patient taking differently: 2 g daily as needed (pain).)            Past Medical History:  Diagnosis Date   Anxiety    Asthma    Depression    Hx of hysterectomy    Hyperlipidemia    Hypertension    Hypothyroidism    Suicide attempt (HCC) 1997   when a relationship broke up     Suicide attempt (HCC)    3 months ago because of  ill health of her parents    Uterine cancer (HCC) 2005       Objective:    wts  09/18/2022        173  06/18/2022        180 05/07/2022        183   04/10/22 180 lb 12.8 oz (82 kg)  03/26/22 181 lb 6.4 oz (82.3 kg)  12/21/21 175 lb (79.4 kg)      Vital signs reviewed  09/18/2022  - Note at rest 02 sats  96% on RA   General appearance:    amb pleasant [place] female nad   HEENT : Oropharynx  clear        NECK :  without  apparent JVD/ palpable Nodes/TM    LUNGS: no acc muscle use,  Nl contour chest which is clear to A and P bilaterally without cough on insp or exp maneuvers   CV:  RRR  no s3 or murmur or increase in P2, and no edema   ABD:  soft and nontender   MS:  Nl gait/ ext warm without deformities Or obvious joint restrictions  calf tenderness, cyanosis or clubbing    SKIN: warm and dry without lesions    NEURO:  alert, approp, nl sensorium with  no motor or cerebellar deficits apparent.        Assessment

## 2022-09-17 ENCOUNTER — Ambulatory Visit: Payer: No Typology Code available for payment source | Admitting: Physician Assistant

## 2022-09-17 ENCOUNTER — Encounter: Payer: Self-pay | Admitting: Physician Assistant

## 2022-09-17 DIAGNOSIS — F3178 Bipolar disorder, in full remission, most recent episode mixed: Secondary | ICD-10-CM | POA: Diagnosis not present

## 2022-09-17 DIAGNOSIS — G4700 Insomnia, unspecified: Secondary | ICD-10-CM

## 2022-09-17 NOTE — Progress Notes (Signed)
[facility]  Patient ID: Rebecca Rodgers,  MRN: [id number]  PCP: Kerri Perches, MD  Date of Evaluation: 09/17/2022 Time spent:25 minutes  Chief Complaint:  Chief Complaint   Anxiety; Insomnia; Depression; Follow-up   Virtual Visit via Telehealth  I connected with patient by telephone, with their informed consent, and verified patient privacy and that I am speaking with the correct person using two identifiers.  I am private, in my office and the patient is at home.  I discussed the limitations, risks, security and privacy concerns of performing an evaluation and management service by telephone and the availability of in person appointments. I also discussed with the patient that there may be a patient responsible charge related to this service. The patient expressed understanding and agreed to proceed.   I discussed the assessment and treatment plan with the patient. The patient was provided an opportunity to ask questions and all were answered. The patient agreed with the plan and demonstrated an understanding of the instructions.   The patient was advised to call back or seek an in-person evaluation if the symptoms worsen or if the condition fails to improve as anticipated.  I provided 25 minutes of non-face-to-face time during this encounter.  HISTORY/CURRENT STATUS: HPI For 2 mo med check.  Doing a lot better since we increased the Zyprexa 2 months ago. Is more positive, seeing the good in things. No paranoid thinking or dark thoughts. Patient denies increased energy with decreased need for sleep, increased talkativeness, racing thoughts, impulsivity or risky behaviors, increased spending, increased libido, grandiosity, increased irritability or anger, or hallucinations.  Patient is able to enjoy things.  Energy and motivation are good.  No extreme sadness, tearfulness, or feelings of hopelessness.  Sleeps well with Trazodone.  ADLs and personal hygiene are  normal.   Denies any changes in concentration, making decisions, or remembering things.  Appetite has not changed.  Weight is stable. Not anxious. Denies suicidal or homicidal thoughts.  Denies dizziness, syncope, seizures, numbness, tingling, tremor, tics, unsteady gait, slurred speech, confusion.  Denies dystonia. Does have bilat knee pain from OA. Denies unexplained weight loss, frequent infections, or sores that heal slowly.  No polyphagia, polydipsia, or polyuria. Denies visual changes or paresthesias.   Individual Medical History/ Review of Systems: Changes? :No      Past medications for mental health diagnoses include: Lamictal, Trazodone, Zyprexa, Wellbutrin, others she can't remember.   Allergies: Codeine and Penicillins  Current Medications:  Current Outpatient Medications:    acetaminophen (TYLENOL) 500 MG tablet, Take 500 mg by mouth every 6 (six) hours as needed (pain)., Disp: , Rfl:    albuterol (VENTOLIN HFA) 108 (90 Base) MCG/ACT inhaler, Inhale 2 puffs into the lungs every 6 (six) hours as needed for wheezing or shortness of breath., Disp: 1 each, Rfl: 0   amLODipine (NORVASC) 10 MG tablet, Take 1 tablet (10 mg total) by mouth daily., Disp: 90 tablet, Rfl: 1   buPROPion (WELLBUTRIN XL) 150 MG 24 hr tablet, Take 1 tablet (150 mg total) by mouth daily., Disp: 90 tablet, Rfl: 1   Cholecalciferol (VITAMIN D) 125 MCG (5000 UT) CAPS, Take 5,000 Units by mouth every Tuesday., Disp: , Rfl:    clotrimazole-betamethasone (LOTRISONE) cream, APPLY TO AFFECTED AREAS TWICE A DAY, Disp: 45 g, Rfl: 1   ezetimibe (ZETIA) 10 MG tablet, Take 1 tablet (10 mg total) by mouth daily., Disp: 90 tablet, Rfl: 0   famotidine (PEPCID) 20 MG tablet, One after supper, Disp:  30 tablet, Rfl: 11   famotidine (PEPCID) 20 MG tablet, Take 1 tablet (20 mg total) by mouth 2 (two) times daily. After breakfast and supper, Disp: 90 tablet, Rfl: 1   lamoTRIgine (LAMICTAL) 200 MG tablet, Take 1 tablet (200 mg total) by  mouth daily., Disp: 90 tablet, Rfl: 0   levothyroxine (SYNTHROID) 50 MCG tablet, TAKE 1 TABLET DAILY MONDAY THROUGH SATURDAY AND TAKE 1 AND 1/2 TABLETS ON SUNDAY, Disp: 97 tablet, Rfl: 0   mometasone-formoterol (DULERA) 100-5 MCG/ACT AERO, Inhale 2 puffs into the lungs in the morning and at bedtime., Disp: 8.8 g, Rfl: 0   montelukast (SINGULAIR) 10 MG tablet, Take 1 tablet (10 mg total) by mouth at bedtime., Disp: 90 tablet, Rfl: 1   OLANZapine (ZYPREXA) 7.5 MG tablet, Take 1 tablet (7.5 mg total) by mouth at bedtime., Disp: 30 tablet, Rfl: 5   omeprazole (PRILOSEC) 40 MG capsule, Take 30-60 min before first meal of the day, Disp: 30 capsule, Rfl: 11   rosuvastatin (CRESTOR) 40 MG tablet, Take 1 tablet (40 mg total) by mouth daily., Disp: 90 tablet, Rfl: 0   traZODone (DESYREL) 100 MG tablet, TAKE 1 TO 2 TABLETS (100 TO 200 MG TOTAL) AT BEDTIME AS NEEDED FOR SLEEP., Disp: 180 tablet, Rfl: 1   triamterene-hydrochlorothiazide (MAXZIDE-25) 37.5-25 MG tablet, Take 1 tablet by mouth daily., Disp: 90 tablet, Rfl: 0   vitamin C (ASCORBIC ACID) 500 MG tablet, Take 500 mg by mouth daily., Disp: , Rfl:    VOLTAREN 1 % GEL, APPLY TO THE AFFECTED AREA(S) TWICE A DAY AS NEEDED FOR PAIN (Patient taking differently: 2 g daily as needed (pain).), Disp: 100 g, Rfl: 0 Medication Side Effects: none  Family Medical/ Social History: Changes? No  MENTAL HEALTH EXAM:  There were no vitals taken for this visit.There is no height or weight on file to calculate BMI.  General Appearance:  unable to assess  Eye Contact:   unable to assess  Speech:  Clear and Coherent and Normal Rate  Volume:  Normal  Mood:  Euthymic  Affect:   unable to assess  Thought Process:  Goal Directed and Descriptions of Associations: Circumstantial  Orientation:  Full (Time, Place, and Person)  Thought Content: Logical   Suicidal Thoughts:  No  Homicidal Thoughts:  No  Memory:  WNL  Judgement:  Good  Insight:  Good  Psychomotor  Activity:   unable to assess  Concentration:  Concentration: Good  Recall:  Good  Fund of Knowledge: Good  Language: Good  Assets:  Desire for Improvement  ADL's:  Intact  Cognition: WNL  Prognosis:  Good  PCP follows labs.  DIAGNOSES:    ICD-10-CM   1. Bipolar disorder, in full remission, most recent episode mixed (HCC)  F31.78     2. Insomnia, unspecified type  G47.00       Receiving Psychotherapy: No   RECOMMENDATIONS:  PDMP was reviewed.  No controlled substances. I provided 25 minutes of non-face-to-face time during this encounter, including time spent before and after the visit in records review, medical decision making, counseling pertinent to today's visit, and charting.   I'm glad to see her doing better. No changes needed.   Continue Wellbutrin XL 150 mg, 1 p.o. daily. Continue Lamictal 200 mg, 1 p.o. daily. Continue Zyprexa  7.5 mg po at bedtime. Continue trazodone 100 mg, 1-2 p.o. nightly as needed sleep.   Recommend counseling. Return in 6 months.  Melony Overly, PA-C

## 2022-09-18 ENCOUNTER — Ambulatory Visit (INDEPENDENT_AMBULATORY_CARE_PROVIDER_SITE_OTHER): Payer: No Typology Code available for payment source | Admitting: Internal Medicine

## 2022-09-18 ENCOUNTER — Encounter: Payer: Self-pay | Admitting: Internal Medicine

## 2022-09-18 VITALS — BP 128/57 | HR 80 | Ht 63.0 in | Wt 173.8 lb

## 2022-09-18 DIAGNOSIS — Z87891 Personal history of nicotine dependence: Secondary | ICD-10-CM

## 2022-09-18 DIAGNOSIS — J45991 Cough variant asthma: Secondary | ICD-10-CM | POA: Diagnosis not present

## 2022-09-18 NOTE — Patient Instructions (Signed)
No change in medications  My office will be contacting you by phone for referral to lung cancer screening program  - if you don't hear back from my office within one week please call us back or notify us thru MyChart and we'll address it right away.   Please schedule a follow up visit in 12 months but call sooner if needed

## 2022-09-18 NOTE — Assessment & Plan Note (Signed)
Onset  around 2021 in pt on symb 160 for AB  -  nl pfts 11/14/2008 quit smoking  2014  - 03/26/2022 reduced symbort to 80 2bid/ max gerd rx due to upper > lower airway symptoms  - Allergy screen 03/26/2022 >  Eos 0.1 /  IgE12   - 03/26/2022  After extensive coaching inhaler device,  effectiveness =    60% from a baseline of < 30%  - FENO 04/10/2022  = 24 on symb 80 2bid with poor hfa technique - Sinus CT 05/01/22 >>>wnl  - 05/07/2022  After extensive coaching inhaler device,  effectiveness =    80% > ok to use symbicort 80 up to 2 bid  - 05/07/2022 added tessalon  200 prn > not really helpful  - PFT's  05/21/22  FEV1 2.05 (89 % ) ratio 0.87  p 0 % improvement from saba p 0 prior to study with DLCO  14.44 (78%)   and FV curve blunted in effort dep portion  and ERV 42% at wt 182    All goals of chronic asthma control met including optimal function and elimination of symptoms with minimal need for rescue therapy.  Contingencies discussed in full including contacting this office immediately if not controlling the symptoms using the rule of two's.     F/u can be yearly, sooner if needed          Each maintenance medication was reviewed in detail including emphasizing most importantly the difference between maintenance and prns and under what circumstances the prns are to be triggered using an action plan format where appropriate.  Total time for H and P, chart review, counseling, reviewing hfa device(s) and generating customized AVS unique to this office visit / same day charting  > 30 min summary ov

## 2022-10-14 ENCOUNTER — Other Ambulatory Visit: Payer: Self-pay

## 2022-10-14 MED ORDER — ROSUVASTATIN CALCIUM 40 MG PO TABS: 40.0000 mg | ORAL_TABLET | Freq: Every day | ORAL | 0 refills | Status: DC

## 2022-10-15 ENCOUNTER — Other Ambulatory Visit: Payer: Self-pay

## 2022-10-15 MED ORDER — ROSUVASTATIN CALCIUM 40 MG PO TABS: 40.0000 mg | ORAL_TABLET | Freq: Every day | ORAL | 0 refills | Status: DC

## 2022-10-21 ENCOUNTER — Other Ambulatory Visit: Payer: Self-pay | Admitting: Physician Assistant

## 2022-10-21 DIAGNOSIS — F3178 Bipolar disorder, in full remission, most recent episode mixed: Secondary | ICD-10-CM

## 2022-11-16 ENCOUNTER — Other Ambulatory Visit: Payer: Self-pay | Admitting: Internal Medicine

## 2022-11-16 DIAGNOSIS — J45991 Cough variant asthma: Secondary | ICD-10-CM

## 2022-11-19 ENCOUNTER — Ambulatory Visit: Payer: No Typology Code available for payment source | Admitting: Physician Assistant

## 2022-11-19 ENCOUNTER — Encounter: Payer: Self-pay | Admitting: Physician Assistant

## 2022-11-19 DIAGNOSIS — G4700 Insomnia, unspecified: Secondary | ICD-10-CM

## 2022-11-19 DIAGNOSIS — F3178 Bipolar disorder, in full remission, most recent episode mixed: Secondary | ICD-10-CM

## 2022-11-19 NOTE — Progress Notes (Signed)
[facility]  Patient ID: Rebecca Rodgers,  MRN: [id number]  PCP: Kerri Perches, MD  Date of Evaluation: 11/19/2022 Time spent:25 minutes  Chief Complaint:  Chief Complaint   Depression    Virtual Visit via Telehealth  I connected with patient by telephone, with their informed consent, and verified patient privacy and that I am speaking with the correct person using two identifiers.  I am private, in my office and the patient is at home.  I discussed the limitations, risks, security and privacy concerns of performing an evaluation and management service by telephone and the availability of in person appointments. I also discussed with the patient that there may be a patient responsible charge related to this service. The patient expressed understanding and agreed to proceed.   I discussed the assessment and treatment plan with the patient. The patient was provided an opportunity to ask questions and all were answered. The patient agreed with the plan and demonstrated an understanding of the instructions.   The patient was advised to call back or seek an in-person evaluation if the symptoms worsen or if the condition fails to improve as anticipated.  I provided 25 minutes of non-face-to-face time during this encounter.  HISTORY/CURRENT STATUS: HPI For routine med check.  Doing really well. Patient is able to enjoy things.  Excited about going on a cruise this fall.  Energy and motivation are good.  No extreme sadness, tearfulness, or feelings of hopelessness.  Sleeps well most of the time. ADLs and personal hygiene are normal.   Denies any changes in concentration, making decisions, or remembering things.  Appetite has not changed.  She has purposely lost weight.  No complaints of anxiety.  Denies suicidal or homicidal thoughts.  Patient denies increased energy with decreased need for sleep, increased talkativeness, racing thoughts, impulsivity or risky behaviors,  increased spending, increased libido, grandiosity, increased irritability or anger, paranoia, or hallucinations.  Denies dizziness, syncope, seizures, numbness, tingling, tremor, tics, unsteady gait, slurred speech, confusion.  Denies dystonia. Does have bilat knee pain from OA. Denies unexplained weight loss, frequent infections, or sores that heal slowly.  No polyphagia, polydipsia, or polyuria. Denies visual changes or paresthesias.   Individual Medical History/ Review of Systems: Changes? :No      Past medications for mental health diagnoses include: Lamictal, Trazodone, Zyprexa, Wellbutrin, others she can't remember.   Allergies: Codeine and Penicillins  Current Medications:  Current Outpatient Medications:    albuterol (VENTOLIN HFA) 108 (90 Base) MCG/ACT inhaler, Inhale 2 puffs into the lungs every 6 (six) hours as needed for wheezing or shortness of breath., Disp: 1 each, Rfl: 0   amLODipine (NORVASC) 10 MG tablet, Take 1 tablet (10 mg total) by mouth daily., Disp: 90 tablet, Rfl: 1   buPROPion (WELLBUTRIN XL) 150 MG 24 hr tablet, Take 1 tablet (150 mg total) by mouth daily., Disp: 90 tablet, Rfl: 1   Cholecalciferol (VITAMIN D) 125 MCG (5000 UT) CAPS, Take 5,000 Units by mouth every Tuesday., Disp: , Rfl:    clotrimazole-betamethasone (LOTRISONE) cream, APPLY TO AFFECTED AREAS TWICE A DAY, Disp: 45 g, Rfl: 1   ezetimibe (ZETIA) 10 MG tablet, Take 1 tablet (10 mg total) by mouth daily., Disp: 90 tablet, Rfl: 0   famotidine (PEPCID) 20 MG tablet, One after supper, Disp: 30 tablet, Rfl: 11   famotidine (PEPCID) 20 MG tablet, Take 1 tablet (20 mg total) by mouth 2 (two) times daily. After breakfast and supper, Disp: 90 tablet, Rfl: 1  lamoTRIgine (LAMICTAL) 200 MG tablet, TAKE ONE TABLET DAILY, Disp: 90 tablet, Rfl: 0   levothyroxine (SYNTHROID) 50 MCG tablet, TAKE 1 TABLET DAILY MONDAY THROUGH SATURDAY AND TAKE 1 AND 1/2 TABLETS ON SUNDAY, Disp: 97 tablet, Rfl: 0   mometasone-formoterol  (DULERA) 100-5 MCG/ACT AERO, Inhale 2 puffs into the lungs in the morning and at bedtime., Disp: 8.8 g, Rfl: 0   montelukast (SINGULAIR) 10 MG tablet, Take 1 tablet (10 mg total) by mouth at bedtime., Disp: 90 tablet, Rfl: 1   OLANZapine (ZYPREXA) 7.5 MG tablet, Take 1 tablet (7.5 mg total) by mouth at bedtime., Disp: 30 tablet, Rfl: 5   omeprazole (PRILOSEC) 40 MG capsule, Take 30-60 min before first meal of the day, Disp: 30 capsule, Rfl: 11   rosuvastatin (CRESTOR) 40 MG tablet, Take 1 tablet (40 mg total) by mouth daily., Disp: 90 tablet, Rfl: 0   traZODone (DESYREL) 100 MG tablet, TAKE 1 TO 2 TABLETS (100 TO 200 MG TOTAL) AT BEDTIME AS NEEDED FOR SLEEP., Disp: 180 tablet, Rfl: 1   triamterene-hydrochlorothiazide (MAXZIDE-25) 37.5-25 MG tablet, Take 1 tablet by mouth daily., Disp: 90 tablet, Rfl: 0   vitamin C (ASCORBIC ACID) 500 MG tablet, Take 500 mg by mouth daily., Disp: , Rfl:    VOLTAREN 1 % GEL, APPLY TO THE AFFECTED AREA(S) TWICE A DAY AS NEEDED FOR PAIN (Patient taking differently: 2 g daily as needed (pain).), Disp: 100 g, Rfl: 0   acetaminophen (TYLENOL) 500 MG tablet, Take 500 mg by mouth every 6 (six) hours as needed (pain)., Disp: , Rfl:  Medication Side Effects: none  Family Medical/ Social History: Changes? No  MENTAL HEALTH EXAM:  There were no vitals taken for this visit.There is no height or weight on file to calculate BMI.  General Appearance:  unable to assess  Eye Contact:   unable to assess  Speech:  Clear and Coherent and Normal Rate  Volume:  Normal  Mood:  Euthymic  Affect:   unable to assess  Thought Process:  Goal Directed and Descriptions of Associations: Circumstantial  Orientation:  Full (Time, Place, and Person)  Thought Content: Logical   Suicidal Thoughts:  No  Homicidal Thoughts:  No  Memory:  WNL  Judgement:  Good  Insight:  Good  Psychomotor Activity:   unable to assess  Concentration:  Concentration: Good  Recall:  Good  Fund of Knowledge:  Good  Language: Good  Assets:  Desire for Improvement Financial Resources/Insurance Housing Transportation  ADL's:  Intact  Cognition: WNL  Prognosis:  Good   PCP follows labs.  DIAGNOSES:    ICD-10-CM   1. Bipolar disorder, in full remission, most recent episode mixed (HCC)  F31.78     2. Insomnia, unspecified type  G47.00      Receiving Psychotherapy: No   RECOMMENDATIONS:  PDMP was reviewed.  No controlled substances. I provided 25 minutes of non-face-to-face time during this encounter, including time spent before and after the visit in records review, medical decision making, counseling pertinent to today's visit, and charting.   Ciera is doing well so no changes are needed.    Continue Wellbutrin XL 150 mg, 1 p.o. daily. Continue Lamictal 200 mg, 1 p.o. daily. Continue Zyprexa  7.5 mg po at bedtime. Continue trazodone 100 mg, 1-2 p.o. nightly as needed sleep.   Recommend counseling. Return in 6 months.  Melony Overly, PA-C

## 2022-11-27 ENCOUNTER — Other Ambulatory Visit: Payer: Self-pay | Admitting: Family Medicine

## 2022-11-28 ENCOUNTER — Other Ambulatory Visit: Payer: Self-pay

## 2022-11-28 MED ORDER — ROSUVASTATIN CALCIUM 40 MG PO TABS: 40.0000 mg | ORAL_TABLET | Freq: Every day | ORAL | 0 refills | Status: DC

## 2022-12-24 ENCOUNTER — Other Ambulatory Visit: Payer: Self-pay | Admitting: Physician Assistant

## 2022-12-24 ENCOUNTER — Other Ambulatory Visit: Payer: Self-pay | Admitting: Family Medicine

## 2022-12-24 DIAGNOSIS — F3178 Bipolar disorder, in full remission, most recent episode mixed: Secondary | ICD-10-CM

## 2023-01-08 ENCOUNTER — Other Ambulatory Visit: Payer: Self-pay

## 2023-01-08 ENCOUNTER — Encounter: Payer: No Typology Code available for payment source | Admitting: Family Medicine

## 2023-01-08 MED ORDER — ROSUVASTATIN CALCIUM 40 MG PO TABS: 40.0000 mg | ORAL_TABLET | Freq: Every day | ORAL | 0 refills | Status: DC

## 2023-01-15 ENCOUNTER — Ambulatory Visit (HOSPITAL_COMMUNITY): Payer: No Typology Code available for payment source

## 2023-01-20 ENCOUNTER — Ambulatory Visit (HOSPITAL_COMMUNITY): Payer: No Typology Code available for payment source

## 2023-01-22 ENCOUNTER — Ambulatory Visit (HOSPITAL_COMMUNITY)
Admission: RE | Admit: 2023-01-22 | Discharge: 2023-01-22 | Disposition: A | Discharge status: Home / Self Care | Payer: No Typology Code available for payment source | Source: Ambulatory Visit | Attending: Family Medicine | Admitting: Family Medicine

## 2023-01-22 ENCOUNTER — Encounter: Payer: Self-pay | Admitting: Family Medicine

## 2023-01-22 ENCOUNTER — Ambulatory Visit (INDEPENDENT_AMBULATORY_CARE_PROVIDER_SITE_OTHER): Payer: No Typology Code available for payment source | Admitting: Family Medicine

## 2023-01-22 ENCOUNTER — Encounter (HOSPITAL_COMMUNITY): Payer: Self-pay

## 2023-01-22 VITALS — BP 114/73 | HR 78 | Ht 63.0 in | Wt 175.1 lb

## 2023-01-22 DIAGNOSIS — Z1231 Encounter for screening mammogram for malignant neoplasm of breast: Secondary | ICD-10-CM | POA: Insufficient documentation

## 2023-01-22 DIAGNOSIS — E785 Hyperlipidemia, unspecified: Secondary | ICD-10-CM | POA: Diagnosis not present

## 2023-01-22 DIAGNOSIS — E038 Other specified hypothyroidism: Secondary | ICD-10-CM | POA: Diagnosis not present

## 2023-01-22 DIAGNOSIS — I10 Essential (primary) hypertension: Secondary | ICD-10-CM | POA: Diagnosis not present

## 2023-01-22 DIAGNOSIS — Z0001 Encounter for general adult medical examination with abnormal findings: Secondary | ICD-10-CM

## 2023-01-22 DIAGNOSIS — E559 Vitamin D deficiency, unspecified: Secondary | ICD-10-CM | POA: Diagnosis not present

## 2023-01-22 NOTE — Patient Instructions (Signed)
F/u in April, call if you need me before  Labs ordered April today  Good you are doing better  RSV vaccine is indicated, check your pharmacy  Keep active, and follow heart healthy diet  Thanks for choosing [facility], we consider it a privelige to serve you.

## 2023-01-23 LAB — CMP14+EGFR
ALT: 18 [IU]/L (ref 0–32)
AST: 21 [IU]/L (ref 0–40)
Albumin: 4.1 g/dL (ref 3.9–4.9)
Alkaline Phosphatase: 62 [IU]/L (ref 44–121)
BUN/Creatinine Ratio: 13 (ref 12–28)
BUN: 13 mg/dL (ref 8–27)
Bilirubin Total: <0.2 mg/dL (ref 0.0–1.2)
CO2: 21 mmol/L (ref 20–29)
Calcium: 10.2 mg/dL (ref 8.7–10.3)
Chloride: 102 mmol/L (ref 96–106)
Creatinine, Ser: 1.04 mg/dL — ABNORMAL HIGH (ref 0.57–1.00)
Globulin, Total: 3.3 g/dL (ref 1.5–4.5)
Glucose: 95 mg/dL (ref 70–99)
Potassium: 4.1 mmol/L (ref 3.5–5.2)
Sodium: 139 mmol/L (ref 134–144)
Total Protein: 7.4 g/dL (ref 6.0–8.5)
eGFR: 59 mL/min/{1.73_m2} — ABNORMAL LOW (ref >59)

## 2023-01-23 LAB — LIPID PANEL
Chol/HDL Ratio: 2.5 {ratio} (ref 0.0–4.4)
Cholesterol, Total: 189 mg/dL (ref 100–199)
HDL: 77 mg/dL (ref >39)
LDL Chol Calc (NIH): 99 mg/dL (ref 0–99)
Triglycerides: 73 mg/dL (ref 0–149)
VLDL Cholesterol Cal: 13 mg/dL (ref 5–40)

## 2023-01-23 LAB — TSH: TSH: 1.45 u[IU]/mL (ref 0.450–4.500)

## 2023-01-23 LAB — VITAMIN D 25 HYDROXY (VIT D DEFICIENCY, FRACTURES): Vit D, 25-Hydroxy: 51.3 ng/mL (ref 30.0–100.0)

## 2023-01-28 ENCOUNTER — Other Ambulatory Visit: Payer: Self-pay | Admitting: Family Medicine

## 2023-01-28 NOTE — Progress Notes (Signed)
    Rebecca Rodgers     MRN: [id number]      DOB: 1955/08/20  Chief Complaint  Patient presents with   Annual Exam    CPE     HPI: Patient is in for annual physical exam. No other health concerns are expressed or addressed at the visit.  labs,  are reviewed. Immunization is reviewed , and  is up to date, needs RSV  PE: BP 114/73 (BP Location: Right Arm, Patient Position: Sitting, Cuff Size: Large)   Pulse 78   Ht 5\' 3"  (1.6 m)   Wt 175 lb 1.9 oz (79.4 kg)   SpO2 96%   BMI 31.02 kg/m   Pleasant  female, alert and oriented x 3, in no cardio-pulmonary distress. Afebrile. HEENT No facial trauma or asymetry. Sinuses non tender.  Extra occullar muscles intact.. External ears normal, . Neck: supple, no adenopathy,JVD or thyromegaly.No bruits.  Chest: Clear to ascultation bilaterally.No crackles or wheezes. Non tender to palpation    Cardiovascular system; Heart sounds normal,  S1 and  S2 ,no S3.  No murmur, or thrill.  Peripheral pulses normal.  Abdomen: Soft, non tender, no organomegaly or masses. Bowel sounds normal. No guarding, tenderness or rebound.     Musculoskeletal exam: Full ROM of spine, hips , shoulders and knees. No deformity ,swelling or crepitus noted. No muscle wasting or atrophy.   Neurologic: Cranial nerves 2 to 12 intact. Power, tone ,sensation and reflexes normal throughout. No disturbance in gait. No tremor.  Skin: Intact, no ulceration, erythema , scaling or rash noted. Pigmentation normal throughout  Psych; Normal mood and affect. Judgement and concentration normal   Assessment & Plan:  Annual visit for general adult medical examination with abnormal findings Annual exam as documented. Counseling done  re healthy lifestyle involving commitment to 150 minutes exercise per week, heart healthy diet, and attaining healthy weight.The importance of adequate sleep also discussed. Changes in health habits are decided on by the patient  with goals and time frames  set for achieving them. Immunization and cancer screening needs are specifically addressed at this visit.

## 2023-01-28 NOTE — Assessment & Plan Note (Addendum)
Annual exam as documented. Counseling done  re healthy lifestyle involving commitment to 150 minutes exercise per week, heart healthy diet, and attaining healthy weight.The importance of adequate sleep also discussed. Changes in health habits are decided on by the patient with goals and time frames  set for achieving them. Immunization and cancer screening needs are specifically addressed at this visit. 

## 2023-02-17 ENCOUNTER — Other Ambulatory Visit: Payer: Self-pay | Admitting: Internal Medicine

## 2023-02-17 DIAGNOSIS — J45991 Cough variant asthma: Secondary | ICD-10-CM

## 2023-02-24 ENCOUNTER — Other Ambulatory Visit: Payer: Self-pay | Admitting: Family Medicine

## 2023-03-04 ENCOUNTER — Other Ambulatory Visit: Payer: Self-pay | Admitting: Physician Assistant

## 2023-03-11 ENCOUNTER — Ambulatory Visit: Payer: No Typology Code available for payment source | Admitting: Physician Assistant

## 2023-03-21 DIAGNOSIS — K219 Gastro-esophageal reflux disease without esophagitis: Secondary | ICD-10-CM | POA: Diagnosis not present

## 2023-03-21 DIAGNOSIS — F17211 Nicotine dependence, cigarettes, in remission: Secondary | ICD-10-CM | POA: Diagnosis not present

## 2023-03-21 DIAGNOSIS — Z681 Body mass index (BMI) 19 or less, adult: Secondary | ICD-10-CM | POA: Diagnosis not present

## 2023-03-21 DIAGNOSIS — Z008 Encounter for other general examination: Secondary | ICD-10-CM | POA: Diagnosis not present

## 2023-03-21 DIAGNOSIS — J4540 Moderate persistent asthma, uncomplicated: Secondary | ICD-10-CM | POA: Diagnosis not present

## 2023-03-24 ENCOUNTER — Other Ambulatory Visit: Payer: Self-pay | Admitting: Family Medicine

## 2023-03-24 ENCOUNTER — Other Ambulatory Visit: Payer: Self-pay | Admitting: Physician Assistant

## 2023-03-24 DIAGNOSIS — F3178 Bipolar disorder, in full remission, most recent episode mixed: Secondary | ICD-10-CM

## 2023-04-01 ENCOUNTER — Telehealth: Payer: Self-pay

## 2023-04-01 NOTE — Telephone Encounter (Signed)
 Please advise ?      Copied from CRM [id number]. Topic: Clinical - Medical Advice >> Apr 01, 2023  1:08 PM Curlee DEL wrote: Reason for CRM: Patient has been experiencing a cough, no fever or any other symptoms, she would like to know what she can do about it.

## 2023-04-08 NOTE — Telephone Encounter (Signed)
Lmtrc-kg

## 2023-04-11 ENCOUNTER — Ambulatory Visit: Payer: Self-pay | Admitting: Family Medicine

## 2023-04-30 ENCOUNTER — Telehealth: Payer: Self-pay | Admitting: Family Medicine

## 2023-04-30 NOTE — Telephone Encounter (Signed)
 I spoee with pt ,mother has had severe dementia for over 5 years , recent deterioration in overall wellbeing , Dana Duncan states all of family are aware . She just wanted me to know as I was her Mother's PCP in the past Support offered

## 2023-04-30 NOTE — Telephone Encounter (Signed)
 Patient called said to let Dr Rodolph Clap know her mother is not doing well. She in nursing home and not eating has no appetite and weighing only 105 lbs.  Patient asked if Dr Rodolph Clap give her a call back [phone].

## 2023-05-13 ENCOUNTER — Other Ambulatory Visit: Payer: Self-pay | Admitting: Internal Medicine

## 2023-05-13 DIAGNOSIS — J45991 Cough variant asthma: Secondary | ICD-10-CM

## 2023-05-14 ENCOUNTER — Other Ambulatory Visit: Payer: Self-pay

## 2023-05-14 MED ORDER — TRIAMTERENE-HCTZ 37.5-25 MG PO TABS: 1.0000 | ORAL_TABLET | Freq: Every day | ORAL | 0 refills | Status: DC

## 2023-05-14 MED ORDER — LEVOTHYROXINE SODIUM 50 MCG PO TABS: ORAL_TABLET | ORAL | 0 refills | Status: DC

## 2023-05-16 ENCOUNTER — Other Ambulatory Visit: Payer: Self-pay

## 2023-05-16 DIAGNOSIS — F3178 Bipolar disorder, in full remission, most recent episode mixed: Secondary | ICD-10-CM

## 2023-05-16 MED ORDER — TRAZODONE HCL 100 MG PO TABS: ORAL_TABLET | ORAL | 0 refills | Status: DC

## 2023-05-16 MED ORDER — LAMOTRIGINE 200 MG PO TABS: 200.0000 mg | ORAL_TABLET | Freq: Every day | ORAL | 0 refills | Status: DC

## 2023-05-16 MED ORDER — BUPROPION HCL ER (XL) 150 MG PO TB24: 150.0000 mg | ORAL_TABLET | Freq: Every day | ORAL | 0 refills | Status: DC

## 2023-05-19 ENCOUNTER — Telehealth: Payer: Self-pay | Admitting: Internal Medicine

## 2023-05-19 NOTE — Telephone Encounter (Signed)
  Patient needs refill of Sanotidone. She is completely out.   Pharmacy: [facility] in [place]

## 2023-05-19 NOTE — Telephone Encounter (Signed)
 I don't prescribe this meds, needs to check with who does.

## 2023-05-19 NOTE — Telephone Encounter (Signed)
Pt is requesting a refill. 

## 2023-05-20 NOTE — Telephone Encounter (Signed)
 Patient called checking on this refill---I read her Dr. Thurston Hole response.  She voiced her understanding

## 2023-05-22 ENCOUNTER — Telehealth: Payer: Self-pay | Admitting: Internal Medicine

## 2023-05-22 ENCOUNTER — Ambulatory Visit (INDEPENDENT_AMBULATORY_CARE_PROVIDER_SITE_OTHER): Payer: No Typology Code available for payment source | Admitting: Physician Assistant

## 2023-05-22 ENCOUNTER — Other Ambulatory Visit: Payer: Self-pay

## 2023-05-22 ENCOUNTER — Encounter: Payer: Self-pay | Admitting: Physician Assistant

## 2023-05-22 DIAGNOSIS — G4700 Insomnia, unspecified: Secondary | ICD-10-CM

## 2023-05-22 DIAGNOSIS — F3178 Bipolar disorder, in full remission, most recent episode mixed: Secondary | ICD-10-CM | POA: Diagnosis not present

## 2023-05-22 DIAGNOSIS — J45991 Cough variant asthma: Secondary | ICD-10-CM

## 2023-05-22 MED ORDER — DULERA 100-5 MCG/ACT IN AERO: 2.0000 | INHALATION_SPRAY | Freq: Every day | RESPIRATORY_TRACT | 1 refills | Status: DC

## 2023-05-22 MED ORDER — BUPROPION HCL ER (XL) 150 MG PO TB24: 150.0000 mg | ORAL_TABLET | Freq: Every day | ORAL | 1 refills | Status: DC

## 2023-05-22 MED ORDER — TRAZODONE HCL 100 MG PO TABS: ORAL_TABLET | ORAL | 1 refills | Status: DC

## 2023-05-22 MED ORDER — OLANZAPINE 7.5 MG PO TABS: 7.5000 mg | ORAL_TABLET | Freq: Every day | ORAL | 1 refills | Status: DC

## 2023-05-22 MED ORDER — LAMOTRIGINE 200 MG PO TABS: 200.0000 mg | ORAL_TABLET | Freq: Every day | ORAL | 1 refills | Status: DC

## 2023-05-22 MED ORDER — FAMOTIDINE 20 MG PO TABS: 20.0000 mg | ORAL_TABLET | Freq: Every day | ORAL | 11 refills | Status: DC

## 2023-05-22 NOTE — Telephone Encounter (Signed)
 Called and spoke with patient she was requesting a refill on her Famotidine , I advised pt that she had refill on this medication that this was sent to [facility] on 05/13/23 with 11 refills pt stated that she can no longer use [facility] [place] . Pt also stated that she will need to have her [facility] , [facility].

## 2023-05-22 NOTE — Telephone Encounter (Signed)
 Patient states needs refill for Sanotidone. States Dr. Sherene Sires prescribed medication. Pharmacy is [facility]. Patient phone number is [phone].

## 2023-05-22 NOTE — Progress Notes (Signed)
 [facility]  Patient ID: Rebecca Rodgers,  MRN: [id number]  PCP: Kerri Perches, MD  Date of Evaluation: 05/22/2023 Time spent:20 minutes  Chief Complaint:  Chief Complaint   Depression; Insomnia; Follow-up    HISTORY/CURRENT STATUS: HPI For routine med check.  Patient is able to enjoy things.  Energy and motivation are good.  No extreme sadness, tearfulness, or feelings of hopelessness.  Sleeps well most of the time.  ADLs and personal hygiene are normal.   Denies any changes in concentration, making decisions, or remembering things.  Appetite has not changed.  Weight is stable.  No c/o anxiety.  Denies suicidal or homicidal thoughts.  Patient denies increased energy with decreased need for sleep, increased talkativeness, racing thoughts, impulsivity or risky behaviors, increased spending, increased libido, grandiosity, increased irritability or anger, paranoia, or hallucinations.  Denies dizziness, syncope, seizures, numbness, tingling, tremor, tics, unsteady gait, slurred speech, confusion. Denies muscle or joint pain, stiffness, or dystonia. Denies unexplained weight loss, frequent infections, or sores that heal slowly.  No polyphagia, polydipsia, or polyuria. Denies visual changes or paresthesias.   Individual Medical History/ Review of Systems: Changes? :No      Past medications for mental health diagnoses include: Lamictal, Trazodone, Zyprexa, Wellbutrin, others she can't remember.   Allergies: Codeine and Penicillins  Current Medications:  Current Outpatient Medications:    acetaminophen (TYLENOL) 500 MG tablet, Take 500 mg by mouth every 6 (six) hours as needed (pain)., Disp: , Rfl:    albuterol (VENTOLIN HFA) 108 (90 Base) MCG/ACT inhaler, Inhale 2 puffs into the lungs every 6 (six) hours as needed for wheezing or shortness of breath., Disp: 1 each, Rfl: 0   amLODipine (NORVASC) 10 MG tablet, TAKE ONE TABLET DAILY, Disp: 90 tablet, Rfl: 1    Cholecalciferol (VITAMIN D) 125 MCG (5000 UT) CAPS, Take 5,000 Units by mouth every Tuesday., Disp: , Rfl:    clotrimazole-betamethasone (LOTRISONE) cream, APPLY TO AFFECTED AREAS TWICE A DAY, Disp: 45 g, Rfl: 1   ezetimibe (ZETIA) 10 MG tablet, TAKE ONE TABLET DAILY, Disp: 90 tablet, Rfl: 1   famotidine (PEPCID) 20 MG tablet, Take 1 tablet (20 mg total) by mouth daily., Disp: 30 tablet, Rfl: 11   levothyroxine (SYNTHROID) 50 MCG tablet, TAKE ONE TABLET DAILY AND EXTRA 1/2 TABLET ON SUNDAY, Disp: 97 tablet, Rfl: 0   mometasone-formoterol (DULERA) 100-5 MCG/ACT AERO, Inhale 2 puffs into the lungs daily., Disp: 13 g, Rfl: 1   montelukast (SINGULAIR) 10 MG tablet, TAKE ONE TABLET AT BEDTIME, Disp: 90 tablet, Rfl: 1   omeprazole (PRILOSEC) 40 MG capsule, Take 30-60 min before first meal of the day, Disp: 30 capsule, Rfl: 11   rosuvastatin (CRESTOR) 40 MG tablet, TAKE 1 TABLET DAILY, Disp: 90 tablet, Rfl: 1   triamterene-hydrochlorothiazide (MAXZIDE-25) 37.5-25 MG tablet, Take 1 tablet by mouth daily., Disp: 90 tablet, Rfl: 0   vitamin C (ASCORBIC ACID) 500 MG tablet, Take 500 mg by mouth daily., Disp: , Rfl:    buPROPion (WELLBUTRIN XL) 150 MG 24 hr tablet, Take 1 tablet (150 mg total) by mouth daily., Disp: 90 tablet, Rfl: 1   lamoTRIgine (LAMICTAL) 200 MG tablet, Take 1 tablet (200 mg total) by mouth daily., Disp: 90 tablet, Rfl: 1   OLANZapine (ZYPREXA) 7.5 MG tablet, Take 1 tablet (7.5 mg total) by mouth at bedtime., Disp: 90 tablet, Rfl: 1   traZODone (DESYREL) 100 MG tablet, TAKE 1 TO 2 TABLETS AT BEDTIME AS NEEDED FOR SLEEP, Disp: 90 tablet,  Rfl: 1   VOLTAREN 1 % GEL, APPLY TO THE AFFECTED AREA(S) TWICE A DAY AS NEEDED FOR PAIN (Patient not taking: Reported on 05/22/2023), Disp: 100 g, Rfl: 0 Medication Side Effects: none  Family Medical/ Social History: Changes? No  MENTAL HEALTH EXAM:  There were no vitals taken for this visit.There is no height or weight on file to calculate BMI.  General  Appearance: Casual, Well Groomed, and Obese  Eye Contact:  Good  Speech:  Clear and Coherent and Normal Rate  Volume:  Normal  Mood:  Euthymic  Affect:  Congruent  Thought Process:  Goal Directed and Descriptions of Associations: Circumstantial  Orientation:  Full (Time, Place, and Person)  Thought Content: Logical   Suicidal Thoughts:  No  Homicidal Thoughts:  No  Memory:  WNL  Judgement:  Good  Insight:  Good  Psychomotor Activity:  Normal  Concentration:  Concentration: Good  Recall:  Good  Fund of Knowledge: Good  Language: Good  Assets:  Desire for Improvement Financial Resources/Insurance Housing Transportation  ADL's:  Intact  Cognition: WNL  Prognosis:  Good   PCP follows labs.  DIAGNOSES:    ICD-10-CM   1. Bipolar disorder, in full remission, most recent episode mixed (HCC)  F31.78 buPROPion (WELLBUTRIN XL) 150 MG 24 hr tablet    traZODone (DESYREL) 100 MG tablet    lamoTRIgine (LAMICTAL) 200 MG tablet    2. Insomnia, unspecified type  G47.00      Receiving Psychotherapy: No   RECOMMENDATIONS:  PDMP was reviewed.  No controlled substances. I provided 20 minutes of face to face time during this encounter, including time spent before and after the visit in records review, medical decision making, counseling pertinent to today's visit, and charting.   Doing well so no changes need to be made.   Continue Wellbutrin XL 150 mg, 1 p.o. daily. Continue Lamictal 200 mg, 1 p.o. daily. Continue Zyprexa  7.5 mg po at bedtime. Continue trazodone 100 mg, 1-2 p.o. nightly as needed sleep.   Return in 6 months.  Melony Overly, PA-C

## 2023-06-19 ENCOUNTER — Telehealth: Payer: Self-pay | Admitting: Family Medicine

## 2023-06-19 NOTE — Telephone Encounter (Signed)
 Copied from CRM [id number]. Topic: General - Call Back - No Documentation >> Jun 19, 2023 12:13 PM Rebecca Rodgers wrote: Reason for CRM: The patient called in stating she has left several messages for her provider to let her know her mother Waunita Sandstrom passed away recently. Please assist her further

## 2023-07-08 ENCOUNTER — Other Ambulatory Visit: Payer: Self-pay | Admitting: Physician Assistant

## 2023-07-08 DIAGNOSIS — F3178 Bipolar disorder, in full remission, most recent episode mixed: Secondary | ICD-10-CM

## 2023-07-22 ENCOUNTER — Encounter: Payer: Self-pay | Admitting: Family Medicine

## 2023-07-22 ENCOUNTER — Encounter: Payer: Self-pay | Admitting: Physician Assistant

## 2023-07-22 ENCOUNTER — Ambulatory Visit: Payer: No Typology Code available for payment source | Admitting: Family Medicine

## 2023-07-22 VITALS — BP 110/73 | HR 73 | Resp 18 | Ht 63.0 in | Wt 180.1 lb

## 2023-07-22 DIAGNOSIS — Z1231 Encounter for screening mammogram for malignant neoplasm of breast: Secondary | ICD-10-CM

## 2023-07-22 DIAGNOSIS — F3177 Bipolar disorder, in partial remission, most recent episode mixed: Secondary | ICD-10-CM | POA: Diagnosis not present

## 2023-07-22 DIAGNOSIS — I10 Essential (primary) hypertension: Secondary | ICD-10-CM

## 2023-07-22 DIAGNOSIS — E785 Hyperlipidemia, unspecified: Secondary | ICD-10-CM | POA: Diagnosis not present

## 2023-07-22 DIAGNOSIS — E038 Other specified hypothyroidism: Secondary | ICD-10-CM

## 2023-07-22 DIAGNOSIS — F4321 Adjustment disorder with depressed mood: Secondary | ICD-10-CM | POA: Diagnosis not present

## 2023-07-22 DIAGNOSIS — R7302 Impaired glucose tolerance (oral): Secondary | ICD-10-CM

## 2023-07-22 DIAGNOSIS — E66811 Obesity, class 1: Secondary | ICD-10-CM

## 2023-07-22 NOTE — Patient Instructions (Addendum)
 Annual exam early November, call if you need me sooner  Labs today  Please let Psychiatry and counsellor know of current griving process  It is important that you exercise regularly at least 30 minutes 5 times a week. If you develop chest pain, have severe difficulty breathing, or feel very tired, stop exercising immediately and seek medical attention   Please intentionally work at reducing sugar and sweets  Please schedule mammogram at checkout  Thanks for choosing [facility], we consider it a privelige to serve you.

## 2023-07-23 ENCOUNTER — Encounter: Payer: Self-pay | Admitting: Family Medicine

## 2023-07-23 LAB — CMP14+EGFR
ALT: 14 IU/L (ref 0–32)
AST: 20 IU/L (ref 0–40)
Albumin: 4.5 g/dL (ref 3.9–4.9)
Alkaline Phosphatase: 66 IU/L (ref 44–121)
BUN/Creatinine Ratio: 12 (ref 12–28)
BUN: 13 mg/dL (ref 8–27)
Bilirubin Total: 0.2 mg/dL (ref 0.0–1.2)
CO2: 19 mmol/L — ABNORMAL LOW (ref 20–29)
Calcium: 9.9 mg/dL (ref 8.7–10.3)
Chloride: 101 mmol/L (ref 96–106)
Creatinine, Ser: 1.05 mg/dL — ABNORMAL HIGH (ref 0.57–1.00)
Globulin, Total: 2.6 g/dL (ref 1.5–4.5)
Glucose: 92 mg/dL (ref 70–99)
Potassium: 4.2 mmol/L (ref 3.5–5.2)
Sodium: 138 mmol/L (ref 134–144)
Total Protein: 7.1 g/dL (ref 6.0–8.5)
eGFR: 58 mL/min/{1.73_m2} — ABNORMAL LOW (ref >59)

## 2023-07-23 LAB — CBC WITH DIFFERENTIAL/PLATELET
Basophils Absolute: 0 10*3/uL (ref 0.0–0.2)
Basos: 1 %
EOS (ABSOLUTE): 0.1 10*3/uL (ref 0.0–0.4)
Eos: 2 %
Hematocrit: 42.5 % (ref 34.0–46.6)
Hemoglobin: 14.5 g/dL (ref 11.1–15.9)
Immature Grans (Abs): 0 10*3/uL (ref 0.0–0.1)
Immature Granulocytes: 0 %
Lymphocytes Absolute: 1.4 10*3/uL (ref 0.7–3.1)
Lymphs: 26 %
MCH: 31 pg (ref 26.6–33.0)
MCHC: 34.1 g/dL (ref 31.5–35.7)
MCV: 91 fL (ref 79–97)
Monocytes Absolute: 0.5 10*3/uL (ref 0.1–0.9)
Monocytes: 9 %
Neutrophils Absolute: 3.3 10*3/uL (ref 1.4–7.0)
Neutrophils: 62 %
Platelets: 335 10*3/uL (ref 150–450)
RBC: 4.67 x10E6/uL (ref 3.77–5.28)
RDW: 13.8 % (ref 11.7–15.4)
WBC: 5.4 10*3/uL (ref 3.4–10.8)

## 2023-07-23 LAB — LIPID PANEL
Chol/HDL Ratio: 2.4 ratio (ref 0.0–4.4)
Cholesterol, Total: 186 mg/dL (ref 100–199)
HDL: 76 mg/dL (ref >39)
LDL Chol Calc (NIH): 97 mg/dL (ref 0–99)
Triglycerides: 71 mg/dL (ref 0–149)
VLDL Cholesterol Cal: 13 mg/dL (ref 5–40)

## 2023-08-04 ENCOUNTER — Encounter: Payer: Self-pay | Admitting: Family Medicine

## 2023-08-04 DIAGNOSIS — E66811 Obesity, class 1: Secondary | ICD-10-CM | POA: Insufficient documentation

## 2023-08-04 DIAGNOSIS — F4321 Adjustment disorder with depressed mood: Secondary | ICD-10-CM | POA: Insufficient documentation

## 2023-08-04 NOTE — Assessment & Plan Note (Signed)
 Hyperlipidemia:Low fat diet discussed and encouraged.   Lipid Panel  Lab Results  Component Value Date   CHOL 186 07/22/2023   HDL 76 07/22/2023   LDLCALC 97 07/22/2023   TRIG 71 07/22/2023   CHOLHDL 2.4 07/22/2023     Controlled, no change in medication

## 2023-08-04 NOTE — Progress Notes (Signed)
 Rebecca Rodgers     MRN: [id number]      DOB: 12/03/1955  Chief Complaint  Patient presents with   Medical Management of Chronic Issues    6 month fu     HPI Rebecca Rodgers is here for follow up and re-evaluation of chronic medical conditions, medication management and review of any available recent lab and radiology data.  Preventive health is updated, specifically  Cancer screening and Immunization.   Questions or concerns regarding consultations or procedures which the PT has had in the interim are  addressed. The PT denies any adverse reactions to current medications since the last visit.  Currently grieving loss of Mother approx 2 months ago, not as focused on healty food choice and exercise as she would like to be, doing  best she can but will benefit from additional therapy , needs to involve her Psych team. Great family support not suicidal or homicidal   ROS Denies recent fever or chills. Denies sinus pressure, nasal congestion, ear pain or sore throat. Denies chest congestion, productive cough or wheezing. Denies chest pains, palpitations and leg swelling Denies abdominal pain, nausea, vomiting,diarrhea or constipation.   Denies dysuria, frequency, hesitancy or incontinence. Denies joint pain, swelling and limitation in mobility. Denies headaches, seizures, numbness, or tingling. Chronic  depression, anxiety and  insomnia.Not as well controlled currently due to grief Denies skin break down or rash.   PE  BP 110/73   Pulse 73   Resp 18   Ht 5\' 3"  (1.6 m)   Wt 180 lb 1.9 oz (81.7 kg)   SpO2 96%   BMI 31.91 kg/m   Patient alert and oriented and in no cardiopulmonary distress.  HEENT: No facial asymmetry, EOMI,     Neck supple .  Chest: Clear to auscultation bilaterally.  CVS: S1, S2 no murmurs, no S3.Regular rate.  ABD: Soft non tender.   Ext: No edema  MS: Adequate ROM spine, shoulders, hips and knees.  Skin: Intact, no ulcerations or rash noted.  Psych:  Good eye contact, normal affect. Memory intact not anxious or depressed appearing.  CNS: CN 2-12 intact, power,  normal throughout.no focal deficits noted.   Assessment & Plan  Hyperlipidemia Hyperlipidemia:Low fat diet discussed and encouraged.   Lipid Panel  Lab Results  Component Value Date   CHOL 186 07/22/2023   HDL 76 07/22/2023   LDLCALC 97 07/22/2023   TRIG 71 07/22/2023   CHOLHDL 2.4 07/22/2023     Controlled, no change in medication   Hypothyroidism Controlled, no change in medication   IGT (impaired glucose tolerance) Patient educated about the importance of limiting  Carbohydrate intake , the need to commit to daily physical activity for a minimum of 30 minutes , and to commit weight loss. The fact that changes in all these areas will reduce or eliminate all together the development of diabetes is stressed.      Latest Ref Rng & Units 07/22/2023   11:46 AM 01/22/2023    2:33 PM 12/21/2021    2:05 PM 08/06/2021    1:26 PM 06/06/2021    2:52 PM  Diabetic Labs  HbA1c 4.8 - 5.6 %   5.7   CANCELED   Chol 100 - 199 mg/dL 782  956  213     HDL >08 mg/dL 76  77  72     Calc LDL 0 - 99 mg/dL 97  99  86     Triglycerides 0 - 149 mg/dL 71  73  83     Creatinine 0.57 - 1.00 mg/dL 0.98  1.19  1.47  8.29  CANCELED       07/22/2023   11:19 AM 01/22/2023    1:35 PM 09/18/2022   11:24 AM 06/28/2022    1:04 PM 06/18/2022   11:28 AM 05/07/2022   11:47 AM 04/10/2022    3:50 PM  BP/Weight  Systolic BP 110 114 128 110 132 132 132  Diastolic BP 73 73 57 71 68 76 78  Wt. (Lbs) 180.12 175.12 173.8 182.04 180.6 183.2 180.8  BMI 31.91 kg/m2 31.02 kg/m2 30.79 kg/m2 32.25 kg/m2 31.99 kg/m2 32.45 kg/m2 32.03 kg/m2       No data to display          Updated lab needed at/ before next visit.   Essential hypertension Controlled, no change in medication DASH diet and commitment to daily physical activity for a minimum of 30 minutes discussed and encouraged, as a part of  hypertension management. The importance of attaining a healthy weight is also discussed.     07/22/2023   11:19 AM 01/22/2023    1:35 PM 09/18/2022   11:24 AM 06/28/2022    1:04 PM 06/18/2022   11:28 AM 05/07/2022   11:47 AM 04/10/2022    3:50 PM  BP/Weight  Systolic BP 110 114 128 110 132 132 132  Diastolic BP 73 73 57 71 68 76 78  Wt. (Lbs) 180.12 175.12 173.8 182.04 180.6 183.2 180.8  BMI 31.91 kg/m2 31.02 kg/m2 30.79 kg/m2 32.25 kg/m2 31.99 kg/m2 32.45 kg/m2 32.03 kg/m2       Bipolar disorder (HCC) Managed by Psych currently stable  Obesity (BMI 30.0-34.9)  Patient re-educated about  the importance of commitment to a  minimum of 150 minutes of exercise per week as able.  The importance of healthy food choices with portion control discussed, as well as eating regularly and within a 12 hour window most days. The need to choose "clean , green" food 50 to 75% of the time is discussed, as well as to make water  the primary drink and set a goal of 64 ounces water  daily.       07/22/2023   11:19 AM 01/22/2023    1:35 PM 09/18/2022   11:24 AM  Weight /BMI  Weight 180 lb 1.9 oz 175 lb 1.9 oz 173 lb 12.8 oz  Height 5\' 3"  (1.6 m) 5\' 3"  (1.6 m) 5\' 3"  (1.6 m)  BMI 31.91 kg/m2 31.02 kg/m2 30.79 kg/m2    deteriorated  Feeling grief Recommend inc therapy and making Psych team aware of current status Reaction appropriate at this time and hjas family support

## 2023-08-04 NOTE — Assessment & Plan Note (Signed)
 Controlled, no change in medication

## 2023-08-04 NOTE — Assessment & Plan Note (Signed)
 Managed by Psych currently stable

## 2023-08-04 NOTE — Assessment & Plan Note (Signed)
 Patient educated about the importance of limiting  Carbohydrate intake , the need to commit to daily physical activity for a minimum of 30 minutes , and to commit weight loss. The fact that changes in all these areas will reduce or eliminate all together the development of diabetes is stressed.      Latest Ref Rng & Units 07/22/2023   11:46 AM 01/22/2023    2:33 PM 12/21/2021    2:05 PM 08/06/2021    1:26 PM 06/06/2021    2:52 PM  Diabetic Labs  HbA1c 4.8 - 5.6 %   5.7   CANCELED   Chol 100 - 199 mg/dL 960  454  098     HDL >11 mg/dL 76  77  72     Calc LDL 0 - 99 mg/dL 97  99  86     Triglycerides 0 - 149 mg/dL 71  73  83     Creatinine 0.57 - 1.00 mg/dL 9.14  7.82  9.56  2.13  CANCELED       07/22/2023   11:19 AM 01/22/2023    1:35 PM 09/18/2022   11:24 AM 06/28/2022    1:04 PM 06/18/2022   11:28 AM 05/07/2022   11:47 AM 04/10/2022    3:50 PM  BP/Weight  Systolic BP 110 114 128 110 132 132 132  Diastolic BP 73 73 57 71 68 76 78  Wt. (Lbs) 180.12 175.12 173.8 182.04 180.6 183.2 180.8  BMI 31.91 kg/m2 31.02 kg/m2 30.79 kg/m2 32.25 kg/m2 31.99 kg/m2 32.45 kg/m2 32.03 kg/m2       No data to display          Updated lab needed at/ before next visit.

## 2023-08-04 NOTE — Assessment & Plan Note (Signed)
 Recommend inc therapy and making Psych team aware of current status Reaction appropriate at this time and hjas family support

## 2023-08-04 NOTE — Assessment & Plan Note (Signed)
  Patient re-educated about  the importance of commitment to a  minimum of 150 minutes of exercise per week as able.  The importance of healthy food choices with portion control discussed, as well as eating regularly and within a 12 hour window most days. The need to choose "clean , green" food 50 to 75% of the time is discussed, as well as to make water  the primary drink and set a goal of 64 ounces water  daily.       07/22/2023   11:19 AM 01/22/2023    1:35 PM 09/18/2022   11:24 AM  Weight /BMI  Weight 180 lb 1.9 oz 175 lb 1.9 oz 173 lb 12.8 oz  Height 5\' 3"  (1.6 m) 5\' 3"  (1.6 m) 5\' 3"  (1.6 m)  BMI 31.91 kg/m2 31.02 kg/m2 30.79 kg/m2    deteriorated

## 2023-08-04 NOTE — Assessment & Plan Note (Signed)
 Controlled, no change in medication DASH diet and commitment to daily physical activity for a minimum of 30 minutes discussed and encouraged, as a part of hypertension management. The importance of attaining a healthy weight is also discussed.     07/22/2023   11:19 AM 01/22/2023    1:35 PM 09/18/2022   11:24 AM 06/28/2022    1:04 PM 06/18/2022   11:28 AM 05/07/2022   11:47 AM 04/10/2022    3:50 PM  BP/Weight  Systolic BP 110 114 128 110 132 132 132  Diastolic BP 73 73 57 71 68 76 78  Wt. (Lbs) 180.12 175.12 173.8 182.04 180.6 183.2 180.8  BMI 31.91 kg/m2 31.02 kg/m2 30.79 kg/m2 32.25 kg/m2 31.99 kg/m2 32.45 kg/m2 32.03 kg/m2

## 2023-08-08 ENCOUNTER — Other Ambulatory Visit: Payer: Self-pay | Admitting: Family Medicine

## 2023-08-09 ENCOUNTER — Other Ambulatory Visit: Payer: Self-pay | Admitting: Family Medicine

## 2023-08-11 ENCOUNTER — Other Ambulatory Visit: Payer: Self-pay

## 2023-08-11 DIAGNOSIS — J45991 Cough variant asthma: Secondary | ICD-10-CM

## 2023-08-11 MED ORDER — DULERA 100-5 MCG/ACT IN AERO: 2.0000 | INHALATION_SPRAY | Freq: Two times a day (BID) | RESPIRATORY_TRACT | 1 refills | Status: DC

## 2023-08-12 ENCOUNTER — Other Ambulatory Visit: Payer: Self-pay | Admitting: Family Medicine

## 2023-08-12 ENCOUNTER — Telehealth: Payer: Self-pay | Admitting: Physician Assistant

## 2023-08-12 ENCOUNTER — Other Ambulatory Visit: Payer: Self-pay | Admitting: Internal Medicine

## 2023-08-12 NOTE — Telephone Encounter (Signed)
 Pt has RF available. LVM per DPR with info.

## 2023-08-12 NOTE — Telephone Encounter (Signed)
 Next visit is 11/19/23. Rebecca Rodgers is requesting a refill on her Bupropion  called to:  [id number] - [place], [place] - [address]   Phone: [phone]  Fax: [phone]

## 2023-09-08 ENCOUNTER — Ambulatory Visit: Payer: No Typology Code available for payment source

## 2023-09-08 VITALS — Ht 63.0 in | Wt 180.0 lb

## 2023-09-08 DIAGNOSIS — Z780 Asymptomatic menopausal state: Secondary | ICD-10-CM | POA: Diagnosis not present

## 2023-09-08 DIAGNOSIS — Z0000 Encounter for general adult medical examination without abnormal findings: Secondary | ICD-10-CM | POA: Diagnosis not present

## 2023-09-08 NOTE — Patient Instructions (Signed)
 Rebecca Rodgers , Thank you for taking time out of your busy schedule to complete your Annual Wellness Visit with me. I enjoyed our conversation and look forward to speaking with you again next year. I, as well as your care team,  appreciate your ongoing commitment to your health goals. Please review the following plan we discussed and let me know if I can assist you in the future. Your Game plan/ To Do List    Referrals: If you haven't heard from the office you've been referred to, please reach out to them at the phone provided.   An order for a bone density scan was placed for you today Please call the number below to schedule your appt. [facility] [phone]  Follow up Visits: Next Medicare AWV with our clinical staff: September 08, 2024 at 8:40 am telephone visit   Have you seen your provider in the last 6 months (3 months if uncontrolled diabetes)? Yes Next Office Visit with your provider: January 28, 2024 at 10:00am in office  Clinician Recommendations:  Aim for 30 minutes of exercise or brisk walking, 6-8 glasses of water , and 5 servings of fruits and vegetables each day.       This is a list of the screening recommended for you and due dates:  Health Maintenance  Topic Date Due   COVID-19 Vaccine (6 - 2024-25 season) 02/21/2023   Flu Shot  10/24/2023   DEXA scan (bone density measurement)  01/03/2024   Mammogram  01/22/2024   Medicare Annual Wellness Visit  09/07/2024   Colon Cancer Screening  08/09/2026   DTaP/Tdap/Td vaccine (3 - Td or Tdap) 09/18/2026   Pneumococcal Vaccine for age over 15  Completed   Hepatitis C Screening  Completed   Zoster (Shingles) Vaccine  Completed   HPV Vaccine  Aged Out   Meningitis B Vaccine  Aged Out    Advanced directives: (In Chart) A copy of your advanced directives are scanned into your chart should your provider ever need it. Advance Care Planning is important because it:  [x]  Makes sure you receive the medical care that is consistent  with your values, goals, and preferences  [x]  It provides guidance to your family and loved ones and reduces their decisional burden about whether or not they are making the right decisions based on your wishes.  Follow the link provided in your after visit summary or read over the paperwork we have mailed to you to help you started getting your Advance Directives in place. If you need assistance in completing these, please reach out to us  so that we can help you!  Understanding Your Risk for Falls Millions of people have serious injuries from falls each year. It is important to understand your risk of falling. Talk with your health care provider about your risk and what you can do to lower it. If you do have a serious fall, make sure to tell your provider. Falling once raises your risk of falling again. How can falls affect me? Serious injuries from falls are common. These include: Broken bones, such as hip fractures. Head injuries, such as traumatic brain injuries (TBI) or concussions. A fear of falling can cause you to avoid activities and stay at home. This can make your muscles weaker and raise your risk for a fall. What can increase my risk? There are a number of risk factors that increase your risk for falling. The more risk factors you have, the higher your risk of falling. Serious  injuries from a fall happen most often to people who are older than 68 years old. Teenagers and young adults ages 75-29 are also at higher risk. Common risk factors include: Weakness in the lower body. Being generally weak or confused due to long-term (chronic) illness. Dizziness or balance problems. Poor vision. Medicines that cause dizziness or drowsiness. These may include: Medicines for your blood pressure, heart, anxiety, insomnia, or swelling (edema). Pain medicines. Muscle relaxants. Other risk factors include: Drinking alcohol. Having had a fall in the past. Having foot pain or wearing improper  footwear. Working at a dangerous job. Having any of the following in your home: Tripping hazards, such as floor clutter or loose rugs. Poor lighting. Pets. Having dementia or memory loss. What actions can I take to lower my risk of falling?     Physical activity Stay physically fit. Do strength and balance exercises. Consider taking a regular class to build strength and balance. Yoga and tai chi are good options. Vision Have your eyes checked every year and your prescription for glasses or contacts updated as needed. Shoes and walking aids Wear non-skid shoes. Wear shoes that have rubber soles and low heels. Do not wear high heels. Do not walk around the house in socks or slippers. Use a cane or walker as told by your provider. Home safety Attach secure railings on both sides of your stairs. Install grab bars for your bathtub, shower, and toilet. Use a non-skid mat in your bathtub or shower. Attach bath mats securely with double-sided, non-slip rug tape. Use good lighting in all rooms. Keep a flashlight near your bed. Make sure there is a clear path from your bed to the bathroom. Use night-lights. Do not use throw rugs. Make sure all carpeting is taped or tacked down securely. Remove all clutter from walkways and stairways, including extension cords. Repair uneven or broken steps and floors. Avoid walking on icy or slippery surfaces. Walk on the grass instead of on icy or slick sidewalks. Use ice melter to get rid of ice on walkways in the winter. Use a cordless phone. Questions to ask your health care provider Can you help me check my risk for a fall? Do any of my medicines make me more likely to fall? Should I take a vitamin D  supplement? What exercises can I do to improve my strength and balance? Should I make an appointment to have my vision checked? Do I need a bone density test to check for weak bones (osteoporosis)? Would it help to use a cane or a walker? Where to find  more information Centers for Disease Control and Prevention, [employer]: TonerPromos.no Community-Based Fall Prevention Programs: TonerPromos.no [employer] on Aging: BaseRingTones.pl Contact a health care provider if: You fall at home. You are afraid of falling at home. You feel weak, drowsy, or dizzy. This information is not intended to replace advice given to you by your health care provider. Make sure you discuss any questions you have with your health care provider. Document Revised: 11/12/2021 Document Reviewed: 11/12/2021 Elsevier Patient Education  2024 [employer].

## 2023-09-08 NOTE — Progress Notes (Signed)
 Subjective:   Rebecca Rodgers is a 68 y.o. who presents for a Medicare Wellness preventive visit.  As a reminder, Annual Wellness Visits don't include a physical exam, and some assessments may be limited, especially if this visit is performed virtually. We may recommend an in-person follow-up visit with your provider if needed.  Visit Complete: Virtual I connected with  Rebecca Rodgers on 09/08/23 by a audio enabled telemedicine application and verified that I am speaking with the correct person using two identifiers.  Patient Location: Home  Provider Location: Home Office  I discussed the limitations of evaluation and management by telemedicine. The patient expressed understanding and agreed to proceed.  Vital Signs: Because this visit was a virtual/telehealth visit, some criteria may be missing or patient reported. Any vitals not documented were not able to be obtained and vitals that have been documented are patient reported.  VideoDeclined- This patient declined Librarian, academic. Therefore the visit was completed with audio only.  Persons Participating in Visit: Patient.  AWV Questionnaire: No: Patient Medicare AWV questionnaire was not completed prior to this visit.  Cardiac Risk Factors include: advanced age (>32men, >70 women);dyslipidemia;hypertension;obesity (BMI >30kg/m2)     Objective:    Today's Vitals   09/08/23 1103  Weight: 180 lb (81.6 kg)  Height: 5' 3 (1.6 m)   Body mass index is 31.89 kg/m.     09/08/2023   10:54 AM 09/02/2022   11:11 AM 08/27/2021   10:33 AM 08/08/2021    7:50 AM 08/06/2021    1:33 PM 08/23/2020   11:14 AM 01/26/2018    1:13 PM  Advanced Directives  Does Patient Have a Medical Advance Directive? Yes Yes No No No No Yes   Type of Estate agent of [place];Living will Healthcare Power of Attorney       Does patient want to make changes to medical advance directive? No - Patient declined No  - Patient declined    No - Patient declined   Copy of Healthcare Power of Attorney in Chart? Yes - validated most recent copy scanned in chart (See row information) No - copy requested       Would patient like information on creating a medical advance directive?   Yes (ED - Information included in AVS) No - Patient declined No - Patient declined       Data saved with a previous flowsheet row definition    Current Medications (verified) Outpatient Encounter Medications as of 09/08/2023  Medication Sig   acetaminophen  (TYLENOL ) 500 MG tablet Take 500 mg by mouth every 6 (six) hours as needed (pain).   albuterol  (VENTOLIN  HFA) 108 (90 Base) MCG/ACT inhaler Inhale 2 puffs into the lungs every 6 (six) hours as needed for wheezing or shortness of breath.   amLODipine  (NORVASC ) 10 MG tablet TAKE 1 TABLET BY MOUTH EVERY DAY   buPROPion  (WELLBUTRIN  XL) 150 MG 24 hr tablet Take 1 tablet (150 mg total) by mouth daily.   Cholecalciferol (VITAMIN D ) 125 MCG (5000 UT) CAPS Take 5,000 Units by mouth every Tuesday.   clotrimazole -betamethasone  (LOTRISONE ) cream APPLY TO AFFECTED AREAS TWICE A DAY   ezetimibe  (ZETIA ) 10 MG tablet TAKE 1 TABLET BY MOUTH EVERY DAY   famotidine  (PEPCID ) 20 MG tablet Take 1 tablet (20 mg total) by mouth daily.   lamoTRIgine  (LAMICTAL ) 200 MG tablet Take 1 tablet (200 mg total) by mouth daily.   levothyroxine  (SYNTHROID ) 50 MCG tablet TAKE 1 TABLET BY MOUTH  DAILY AND AN ADDITIONAL 1/2 TAB ON SUNDAY   mometasone -formoterol  (DULERA ) 100-5 MCG/ACT AERO Inhale 2 puffs into the lungs in the morning and at bedtime. NEEDS APPT FOR FURTHER REFILLS   montelukast  (SINGULAIR ) 10 MG tablet TAKE 1 TABLET BY MOUTH DAILY AT BEDTIME   OLANZapine  (ZYPREXA ) 7.5 MG tablet Take 1 tablet (7.5 mg total) by mouth at bedtime.   omeprazole  (PRILOSEC) 40 MG capsule TAKE 1 CAPSULE BY MOUTH DAILY BEFORE BREAKFAST   rosuvastatin  (CRESTOR ) 40 MG tablet TAKE 1 TABLET DAILY   traZODone  (DESYREL ) 100 MG tablet  TAKE 1 TO 2 TABLETS BY MOUTH AT BEDTIME AS NEEDED FOR SLEEP   triamterene -hydrochlorothiazide (MAXZIDE-25) 37.5-25 MG tablet TAKE 1 TABLET BY MOUTH EVERY DAY   vitamin C (ASCORBIC ACID) 500 MG tablet Take 500 mg by mouth daily.   VOLTAREN  1 % GEL APPLY TO THE AFFECTED AREA(S) TWICE A DAY AS NEEDED FOR PAIN   No facility-administered encounter medications on file as of 09/08/2023.    Allergies (verified) Codeine and Penicillins   History: Past Medical History:  Diagnosis Date   Anxiety    Asthma    Depression    Hx of hysterectomy    Hyperlipidemia    Hypertension    Hypothyroidism    Suicide attempt (HCC) 1997   when a relationship broke up     Suicide attempt (HCC)    3 months ago because of ill health of her parents    Uterine cancer (HCC) 2005   Past Surgical History:  Procedure Laterality Date   ABDOMINAL HYSTERECTOMY     CARPAL TUNNEL RELEASE  1998   bilateral     COLONOSCOPY  12/20/2010   Procedure: COLONOSCOPY;  Surgeon: Ruby Corporal, MD;  [facility];  Service: Endoscopy;  Laterality: N/A;   COLONOSCOPY N/A 06/27/2016   Procedure: COLONOSCOPY;  Surgeon: Ruby Corporal, MD;  [facility];  Service: Endoscopy;  Laterality: N/A;  1030   COLONOSCOPY WITH PROPOFOL  N/A 08/08/2021   Procedure: COLONOSCOPY WITH PROPOFOL ;  Surgeon: Ruby Corporal, MD;  [facility];  Service: Endoscopy;  Laterality: N/A;  955   DILATION AND CURETTAGE OF UTERUS     at 20    POLYPECTOMY  08/08/2021   Procedure: POLYPECTOMY INTESTINAL;  Surgeon: Ruby Corporal, MD;  [facility];  Service: Endoscopy;;   VESICOVAGINAL FISTULA CLOSURE W/ TAH  2002   dur to uterine cancer    Family History  Problem Relation Age of Onset   Cataracts Mother    Hypertension Mother    Hyperlipidemia Mother    Dementia Mother    Depression Mother    Alzheimer's disease Mother    Coronary artery disease Father    Hyperlipidemia Father    Stroke Father    CVA  Father    Cancer Sister 68       breast    Cancer Brother 38       prostate   Social History   Socioeconomic History   Marital status: Single    Spouse name: Not on file   Number of children: Not on file   Years of education: Not on file   Highest education level: Not on file  Occupational History   Occupation: disabled  Tobacco Use   Smoking status: Former    Current packs/day: 0.00    Average packs/day: 0.3 packs/day for 15.0 years (3.8 ttl pk-yrs)    Types: Cigarettes    Start  date: 03/09/1998    Quit date: 03/09/2013    Years since quitting: 10.5   Smokeless tobacco: Former  Building services engineer status: Former  Substance and Sexual Activity   Alcohol use: No    Alcohol/week: 0.0 standard drinks of alcohol   Drug use: No   Sexual activity: Not Currently    Birth control/protection: Other-see comments, Surgical  Other Topics Concern   Not on file  Social History Narrative   Partner passed in the summer of 2018. This has been very difficult on her.    Social Drivers of Corporate investment banker Strain: Low Risk  (09/08/2023)   Overall Financial Resource Strain (CARDIA)    Difficulty of Paying Living Expenses: Not hard at all  Food Insecurity: No Food Insecurity (09/08/2023)   Hunger Vital Sign    Worried About Running Out of Food in the Last Year: Never true    Ran Out of Food in the Last Year: Never true  Transportation Needs: No Transportation Needs (09/08/2023)   PRAPARE - Administrator, Civil Service (Medical): No    Lack of Transportation (Non-Medical): No  Physical Activity: Sufficiently Active (09/08/2023)   Exercise Vital Sign    Days of Exercise per Week: 7 days    Minutes of Exercise per Session: 30 min  Stress: No Stress Concern Present (09/08/2023)   [employer] of Occupational Health - Occupational Stress Questionnaire    Feeling of Stress: Not at all  Social Connections: Socially Isolated (09/08/2023)   Social Connection and  Isolation Panel    Frequency of Communication with Friends and Family: More than three times a week    Frequency of Social Gatherings with Friends and Family: More than three times a week    Attends Religious Services: Never    Database administrator or Organizations: No    Attends Engineer, structural: Never    Marital Status: Never married    Tobacco Counseling Counseling given: Yes    Clinical Intake:  Pre-visit preparation completed: Yes  Pain : No/denies pain     BMI - recorded: 31.89 Nutritional Status: BMI > 30  Obese Nutritional Risks: None Diabetes: No  Lab Results  Component Value Date   HGBA1C 5.7 (H) 12/21/2021   HGBA1C CANCELED 06/06/2021   HGBA1C 5.7 (H) 12/28/2020     How often do you need to have someone help you when you read instructions, pamphlets, or other written materials from your doctor or pharmacy?: 1 - Never  Interpreter Needed?: No  Information entered by :: Sally Crazier CMA   Activities of Daily Living     09/08/2023   10:57 AM  In your present state of health, do you have any difficulty performing the following activities:  Hearing? 0  Comment wears hearings aids  Vision? 0  Difficulty concentrating or making decisions? 0  Walking or climbing stairs? 0  Dressing or bathing? 0  Doing errands, shopping? 0  Preparing Food and eating ? N  Using the Toilet? N  In the past six months, have you accidently leaked urine? N  Do you have problems with loss of bowel control? N  Managing your Medications? N  Managing your Finances? N  Housekeeping or managing your Housekeeping? N    Patient Care Team: Towanda Fret, MD as PCP - General Avanell Bob Annabell Key, MD as Consulting Physician [facility]) [facility] as Consulting Physician (Optometry) Diamond Formica, MD  as Consulting Physician (Pulmonary Disease) Issac Marine as Physician Assistant (Psychiatry) Alexia Idler, OD as  Consulting Physician (Optometry)  I have updated your Care Teams any recent Medical Services you may have received from other providers in the past year.     Assessment:   This is a routine wellness examination for Rebecca Rodgers.  Hearing/Vision screen Hearing Screening - Comments:: Patient wears hearing aids.   Vision Screening - Comments:: Wears rx glasses - up to date with routine eye exams  Patient sees [facility] in [place]   Goals Addressed             This Visit's Progress    Patient Stated       Be more active       Depression Screen     09/08/2023   11:11 AM 07/22/2023   11:21 AM 01/22/2023    1:38 PM 09/02/2022   11:12 AM 09/02/2022   11:10 AM 06/28/2022    1:07 PM 12/21/2021    1:13 PM  PHQ 2/9 Scores  PHQ - 2 Score 0 0 0 1 1 3  0  PHQ- 9 Score 0     13     Fall Risk     09/08/2023   11:10 AM 07/22/2023   11:21 AM 01/22/2023    1:38 PM 09/02/2022   11:12 AM 06/28/2022    1:07 PM  Fall Risk   Falls in the past year? 0 0 0 1 0  Number falls in past yr: 0 0 0 0 0  Injury with Fall? 0 0 0 0 0  Risk for fall due to : No Fall Risks  No Fall Risks History of fall(s) No Fall Risks  Follow up Falls evaluation completed Falls evaluation completed Falls evaluation completed Falls evaluation completed Falls evaluation completed    MEDICARE RISK AT HOME:  Medicare Risk at Home Any stairs in or around the home?: No If so, are there any without handrails?: No Home free of loose throw rugs in walkways, pet beds, electrical cords, etc?: Yes Adequate lighting in your home to reduce risk of falls?: No Life alert?: No Use of a cane, walker or w/c?: No Grab bars in the bathroom?: Yes Shower chair or bench in shower?: No Elevated toilet seat or a handicapped toilet?: No  TIMED UP AND GO:  Was the test performed?  No  Cognitive Function: 6CIT completed    09/02/2022   11:12 AM  MMSE - Mini Mental State Exam  Not completed: Unable to complete        09/08/2023    11:10 AM 09/02/2022   11:13 AM 08/27/2021   10:37 AM 08/23/2020   11:22 AM 08/02/2019    1:24 PM  6CIT Screen  What Year? 0 points 0 points 0 points  0 points  What month? 0 points 0 points 0 points  0 points  What time? 0 points 0 points 0 points 0 points 0 points  Count back from 20 0 points 0 points 0 points 0 points 0 points  Months in reverse 0 points 0 points 0 points 0 points 0 points  Repeat phrase 0 points 0 points 2 points 4 points 2 points  Total Score 0 points 0 points 2 points  2 points    Immunizations Immunization History  Administered Date(s) Administered   Fluad Quad(high Dose 65+) 12/21/2021, 12/24/2022   Influenza Split 01/08/2005, 01/08/2012, 12/08/2013, 01/06/2015   Influenza Whole 12/29/2008, 11/28/2010   Influenza,inj,Quad PF,6+  Mos 01/15/2013, 02/20/2016, 02/19/2017, 02/23/2018, 12/09/2018, 12/15/2019, 12/20/2020   Influenza-Unspecified 12/08/2013   Moderna Covid-19 Vaccine Bivalent Booster 65yrs & up 08/14/2021, 12/27/2022   Moderna Sars-Covid-2 Vaccination 06/05/2019, 07/21/2019, 03/07/2020   PNEUMOCOCCAL CONJUGATE-20 06/06/2021   Pneumococcal Conjugate-13 10/26/2013   Tdap 03/26/2010, 09/17/2016   Zoster Recombinant(Shingrix) 11/05/2016, 02/25/2017    Screening Tests Health Maintenance  Topic Date Due   COVID-19 Vaccine (6 - 2024-25 season) 02/21/2023   INFLUENZA VACCINE  10/24/2023   DEXA SCAN  01/03/2024   MAMMOGRAM  01/22/2024   Medicare Annual Wellness (AWV)  09/07/2024   Colonoscopy  08/09/2026   DTaP/Tdap/Td (3 - Td or Tdap) 09/18/2026   Pneumococcal Vaccine: 50+ Years  Completed   Hepatitis C Screening  Completed   Zoster Vaccines- Shingrix  Completed   HPV VACCINES  Aged Out   Meningococcal B Vaccine  Aged Out    Health Maintenance  Health Maintenance Due  Topic Date Due   COVID-19 Vaccine (6 - 2024-25 season) 02/21/2023   Health Maintenance Items Addressed: DEXA ordered patient provided with number to call and schedule while on the  phone  Additional Screening:  Vision Screening: Recommended annual ophthalmology exams for early detection of glaucoma and other disorders of the eye. Would you like a referral to an eye doctor? No  Patient is up to date with yearly exams  Dental Screening: Recommended annual dental exams for proper oral hygiene  Community Resource Referral / Chronic Care Management: CRR required this visit?  No   CCM required this visit?  No   Plan:    I have personally reviewed and noted the following in the patient's chart:   Medical and social history Use of alcohol, tobacco or illicit drugs  Current medications and supplements including opioid prescriptions. Patient is not currently taking opioid prescriptions. Functional ability and status Nutritional status Physical activity Advanced directives List of other physicians Hospitalizations, surgeries, and ER visits in previous 12 months Vitals Screenings to include cognitive, depression, and falls Referrals and appointments  In addition, I have reviewed and discussed with patient certain preventive protocols, quality metrics, and best practice recommendations. A written personalized care plan for preventive services as well as general preventive health recommendations were provided to patient.   Rebecca Rodgers, CMA   09/08/2023   After Visit Summary: (Declined) Due to this being a telephonic visit, with patients personalized plan was offered to patient but patient Declined AVS at this time   Notes: Nothing significant to report at this time.

## 2023-10-21 ENCOUNTER — Other Ambulatory Visit: Payer: Self-pay | Admitting: Family Medicine

## 2023-10-27 ENCOUNTER — Other Ambulatory Visit: Payer: Self-pay | Admitting: Physician Assistant

## 2023-10-27 ENCOUNTER — Other Ambulatory Visit: Payer: Self-pay | Admitting: Family Medicine

## 2023-10-27 DIAGNOSIS — F3178 Bipolar disorder, in full remission, most recent episode mixed: Secondary | ICD-10-CM

## 2023-11-19 ENCOUNTER — Ambulatory Visit: Payer: No Typology Code available for payment source | Admitting: Physician Assistant

## 2023-11-19 ENCOUNTER — Encounter: Payer: Self-pay | Admitting: Physician Assistant

## 2023-11-19 DIAGNOSIS — F3178 Bipolar disorder, in full remission, most recent episode mixed: Secondary | ICD-10-CM

## 2023-11-19 DIAGNOSIS — G4700 Insomnia, unspecified: Secondary | ICD-10-CM

## 2023-11-19 DIAGNOSIS — F4321 Adjustment disorder with depressed mood: Secondary | ICD-10-CM

## 2023-11-19 MED ORDER — TRAZODONE HCL 100 MG PO TABS: 100.0000 mg | ORAL_TABLET | Freq: Every evening | ORAL | 1 refills | Status: AC | PRN

## 2023-11-19 NOTE — Progress Notes (Signed)
 [facility]  Patient ID: Rebecca Rodgers,  MRN: [id number]  PCP: Antonetta Rollene BRAVO, MD  Date of Evaluation: 11/19/2023 Time spent:25 minutes  Chief Complaint:  Chief Complaint   Depression; Insomnia; Follow-up   Virtual Visit via Telehealth  I connected with patient by telephone, with their informed consent, and verified patient privacy and that I am speaking with the correct person using two identifiers.  I am private, in my office and the patient is at home.  I discussed the limitations, risks, security and privacy concerns of performing an evaluation and management service by telephone and the availability of in person appointments. I also discussed with the patient that there may be a patient responsible charge related to this service. The patient expressed understanding and agreed to proceed.   I discussed the assessment and treatment plan with the patient. The patient was provided an opportunity to ask questions and all were answered. The patient agreed with the plan and demonstrated an understanding of the instructions.   The patient was advised to call back or seek an in-person evaluation if the symptoms worsen or if the condition fails to improve as anticipated.  I provided approximately 25  minutes of non-face-to-face time during this encounter.  HISTORY/CURRENT STATUS: HPI For routine med check.  Her Mom died in 2023-05-30. Of course she's very sad at times, but she's learning to go on with her new normal.  She quit going to church and the gym for awhile but has recently started back.  Energy and motivation are good.  Stays up late on purpose, likes watch tv.  Sleeps well when she turns off the tv.  ADLs and personal hygiene are normal.   Denies any changes in concentration, making decisions, or remembering things.  Appetite has not changed.  Weight is stable.  No c/o anxiety.  Denies suicidal or homicidal thoughts.  Patient denies increased energy with decreased  need for sleep, increased talkativeness, racing thoughts, impulsivity or risky behaviors, increased spending, increased libido, grandiosity, increased irritability or anger, paranoia, or hallucinations.  Individual Medical History/ Review of Systems: Changes? :No      Past medications for mental health diagnoses include: Lamictal , Trazodone , Zyprexa , Wellbutrin , others she can't remember.   Allergies: Codeine and Penicillins  Current Medications:  Current Outpatient Medications:    acetaminophen  (TYLENOL ) 500 MG tablet, Take 500 mg by mouth every 6 (six) hours as needed (pain)., Disp: , Rfl:    albuterol  (VENTOLIN  HFA) 108 (90 Base) MCG/ACT inhaler, Inhale 2 puffs into the lungs every 6 (six) hours as needed for wheezing or shortness of breath., Disp: 1 each, Rfl: 0   amLODipine  (NORVASC ) 10 MG tablet, TAKE 1 TABLET BY MOUTH EVERY DAY, Disp: 90 tablet, Rfl: 1   buPROPion  (WELLBUTRIN  XL) 150 MG 24 hr tablet, TAKE 1 TABLET BY MOUTH EVERY DAY, Disp: 90 tablet, Rfl: 1   Cholecalciferol (VITAMIN D ) 125 MCG (5000 UT) CAPS, Take 5,000 Units by mouth every Tuesday., Disp: , Rfl:    clotrimazole -betamethasone  (LOTRISONE ) cream, APPLY TO AFFECTED AREAS TWICE A DAY, Disp: 45 g, Rfl: 1   ezetimibe  (ZETIA ) 10 MG tablet, TAKE 1 TABLET BY MOUTH EVERY DAY, Disp: 90 tablet, Rfl: 1   famotidine  (PEPCID ) 20 MG tablet, Take 1 tablet (20 mg total) by mouth daily., Disp: 30 tablet, Rfl: 11   lamoTRIgine  (LAMICTAL ) 200 MG tablet, TAKE 1 TABLET BY MOUTH EVERY DAY, Disp: 90 tablet, Rfl: 1   levothyroxine  (SYNTHROID ) 50 MCG tablet, TAKE 1 TABLET  BY MOUTH DAILY AND AN ADDITIONAL 1/2 TAB ON SUNDAY, Disp: 97 tablet, Rfl: 0   mometasone -formoterol  (DULERA ) 100-5 MCG/ACT AERO, Inhale 2 puffs into the lungs in the morning and at bedtime. NEEDS APPT FOR FURTHER REFILLS, Disp: 13 g, Rfl: 1   montelukast  (SINGULAIR ) 10 MG tablet, TAKE 1 TABLET BY MOUTH EVERYDAY AT BEDTIME, Disp: 90 tablet, Rfl: 1   OLANZapine  (ZYPREXA ) 7.5 MG  tablet, TAKE 1 TABLET BY MOUTH AT BEDTIME., Disp: 90 tablet, Rfl: 1   omeprazole  (PRILOSEC) 40 MG capsule, TAKE 1 CAPSULE BY MOUTH DAILY BEFORE BREAKFAST, Disp: 90 capsule, Rfl: 1   rosuvastatin  (CRESTOR ) 40 MG tablet, TAKE 1 TABLET BY MOUTH EVERY DAY, Disp: 90 tablet, Rfl: 1   triamterene -hydrochlorothiazide (MAXZIDE-25) 37.5-25 MG tablet, TAKE 1 TABLET BY MOUTH EVERY DAY, Disp: 90 tablet, Rfl: 0   vitamin C (ASCORBIC ACID) 500 MG tablet, Take 500 mg by mouth daily., Disp: , Rfl:    VOLTAREN  1 % GEL, APPLY TO THE AFFECTED AREA(S) TWICE A DAY AS NEEDED FOR PAIN, Disp: 100 g, Rfl: 0   traZODone  (DESYREL ) 100 MG tablet, Take 1-2 tablets (100-200 mg total) by mouth at bedtime as needed. for sleep, Disp: 180 tablet, Rfl: 1 Medication Side Effects: none  Family Medical/ Social History: Changes? No  MENTAL HEALTH EXAM:  There were no vitals taken for this visit.There is no height or weight on file to calculate BMI.  General Appearance: Casual, Well Groomed, and Obese  Eye Contact:  Good  Speech:  Clear and Coherent and Normal Rate  Volume:  Normal  Mood:  Euthymic  Affect:  Congruent  Thought Process:  Goal Directed and Descriptions of Associations: Circumstantial  Orientation:  Full (Time, Place, and Person)  Thought Content: Logical   Suicidal Thoughts:  No  Homicidal Thoughts:  No  Memory:  WNL  Judgement:  Good  Insight:  Good  Psychomotor Activity:  Normal  Concentration:  Concentration: Good  Recall:  Good  Fund of Knowledge: Good  Language: Good  Assets:  Desire for Improvement Financial Resources/Insurance Housing Resilience Transportation  ADL's:  Intact  Cognition: WNL  Prognosis:  Good   PCP follows labs.  DIAGNOSES:    ICD-10-CM   1. Bipolar disorder, in full remission, most recent episode mixed (HCC)  F31.78 traZODone  (DESYREL ) 100 MG tablet     Receiving Psychotherapy: No   RECOMMENDATIONS:  PDMP was reviewed.  No controlled substances. I provided  approximately  25 minutes of non-face-to-face time during this encounter, including time spent before and after the visit in records review, medical decision making, counseling pertinent to today's visit, and charting.   My condolences in the loss of her mom.  She is doing well as far as her mental health medications go so no changes will be made.  Continue Wellbutrin  XL 150 mg, 1 p.o. daily. Continue Lamictal  200 mg, 1 p.o. daily. Continue Zyprexa   7.5 mg po at bedtime. Continue trazodone  100 mg, 1-2 p.o. nightly as needed sleep.   Return in 6 months.  Verneita Cooks, PA-C

## 2023-12-18 ENCOUNTER — Other Ambulatory Visit: Payer: Self-pay | Admitting: Internal Medicine

## 2023-12-18 DIAGNOSIS — J45991 Cough variant asthma: Secondary | ICD-10-CM

## 2023-12-18 NOTE — Telephone Encounter (Unsigned)
 Copied from CRM [id number]. Topic: Clinical - Medication Refill >> Dec 18, 2023 10:53 AM Russell PARAS wrote: Medication: mometasone -formoterol  (DULERA ) 100-5 MCG/ACT AERO   Has the patient contacted their pharmacy? Yes (Agent: If no, request that the patient contact the pharmacy for the refill. If patient does not wish to contact the pharmacy document the reason why and proceed with request.) (Agent: If yes, when and what did the pharmacy advise?)  This is the patient's preferred pharmacy:  [id number] - [place], [place] - [address] [place] [address] Phone: [phone] Fax: [phone]   Is this the correct pharmacy for this prescription? Yes If no, delete pharmacy and type the correct one.   Has the prescription been filled recently? Yes  Is the patient out of the medication? No, but only has 2 days left of med  Has the patient been seen for an appointment in the last year OR does the patient have an upcoming appointment? Yes  Can we respond through MyChart? Yes  Agent: Please be advised that Rx refills may take up to 3 business days. We ask that you follow-up with your pharmacy.

## 2023-12-19 MED ORDER — DULERA 100-5 MCG/ACT IN AERO: 2.0000 | INHALATION_SPRAY | Freq: Two times a day (BID) | RESPIRATORY_TRACT | 1 refills | Status: AC

## 2024-01-22 ENCOUNTER — Other Ambulatory Visit: Payer: Self-pay | Admitting: Family Medicine

## 2024-01-28 ENCOUNTER — Ambulatory Visit (HOSPITAL_COMMUNITY)

## 2024-01-28 ENCOUNTER — Encounter: Admitting: Family Medicine

## 2024-01-28 ENCOUNTER — Encounter (HOSPITAL_COMMUNITY): Payer: Self-pay

## 2024-01-28 ENCOUNTER — Ambulatory Visit (HOSPITAL_COMMUNITY)
Admission: RE | Admit: 2024-01-28 | Discharge: 2024-01-28 | Disposition: A | Source: Ambulatory Visit | Attending: Family Medicine | Admitting: Family Medicine

## 2024-01-28 ENCOUNTER — Ambulatory Visit (HOSPITAL_COMMUNITY)
Admission: RE | Admit: 2024-01-28 | Discharge: 2024-01-28 | Disposition: A | Discharge status: Home / Self Care | Source: Ambulatory Visit | Attending: Family Medicine | Admitting: Family Medicine

## 2024-01-28 ENCOUNTER — Encounter: Payer: Self-pay | Admitting: Family Medicine

## 2024-01-28 ENCOUNTER — Ambulatory Visit: Admitting: Family Medicine

## 2024-01-28 VITALS — BP 124/79 | HR 84 | Resp 18 | Ht 63.0 in | Wt 185.0 lb

## 2024-01-28 DIAGNOSIS — I10 Essential (primary) hypertension: Secondary | ICD-10-CM | POA: Diagnosis not present

## 2024-01-28 DIAGNOSIS — E785 Hyperlipidemia, unspecified: Secondary | ICD-10-CM

## 2024-01-28 DIAGNOSIS — Z1231 Encounter for screening mammogram for malignant neoplasm of breast: Secondary | ICD-10-CM | POA: Diagnosis present

## 2024-01-28 DIAGNOSIS — R7302 Impaired glucose tolerance (oral): Secondary | ICD-10-CM

## 2024-01-28 DIAGNOSIS — Z0000 Encounter for general adult medical examination without abnormal findings: Secondary | ICD-10-CM

## 2024-01-28 DIAGNOSIS — E038 Other specified hypothyroidism: Secondary | ICD-10-CM | POA: Diagnosis not present

## 2024-01-28 DIAGNOSIS — Z780 Asymptomatic menopausal state: Secondary | ICD-10-CM | POA: Insufficient documentation

## 2024-01-28 DIAGNOSIS — E559 Vitamin D deficiency, unspecified: Secondary | ICD-10-CM

## 2024-01-28 MED ORDER — CLOTRIMAZOLE-BETAMETHASONE 1-0.05 % EX CREA
TOPICAL_CREAM | CUTANEOUS | 1 refills | Status: AC
Start: 2024-01-28 — End: ?

## 2024-01-28 NOTE — Progress Notes (Signed)
    Rebecca Rodgers     MRN: [id number]      DOB: 11-Feb-1956  Chief Complaint  Patient presents with   Annual Exam    Cpe     HPI: Patient is in for annual physical exam. No other health concerns are expressed or addressed at the visit. Immunization is reviewed , and  updated if needed.   PE: BP 124/79   Pulse 84   Resp 18   Ht 5' 3 (1.6 m)   Wt 185 lb 0.6 oz (83.9 kg)   SpO2 96%   BMI 32.78 kg/m   Pleasant  female, alert and oriented x 3, in no cardio-pulmonary distress. Afebrile. HEENT No facial trauma or asymetry. Sinuses non tender.  Extra occullar muscles intact.. External ears normal, . Neck: supple, no adenopathy,JVD or thyromegaly.No bruits.  Chest: Clear to ascultation bilaterally.No crackles or wheezes. Non tender to palpation    Cardiovascular system; Heart sounds normal,  S1 and  S2 ,no S3.  No murmur, or thrill. Apical beat not displaced Peripheral pulses normal.  Abdomen: Soft, non tender, no organomegaly or masses. No bruits. Bowel sounds normal. No guarding, tenderness or rebound.    Musculoskeletal exam: Full ROM of spine, hips , shoulders and knees. No deformity ,swelling or crepitus noted. No muscle wasting or atrophy.   Neurologic: Cranial nerves 2 to 12 intact. Power, tone ,sensation  normal throughout. No disturbance in gait. No tremor.  Skin: Intact, no ulceration, erythema , scaling or rash noted. Pigmentation normal throughout  Psych; Normal mood and affect. Judgement and concentration normal   Assessment & Plan:  Encounter for annual health examination Annual exam as documented. Counseling done  re healthy lifestyle involving commitment to 150 minutes exercise per week, heart healthy diet, and attaining healthy weight.The importance of adequate sleep also discussed. Regular seat belt use and home safety, is also discussed. Changes in health habits are decided on by the patient with goals and time frames  set for  achieving them. Immunization and cancer screening needs are specifically addressed at this visit.

## 2024-01-28 NOTE — Patient Instructions (Addendum)
 F/u in 6 months, call if you need me sooner   Pls add fasting lipid, and vit D And  remove CBC from labs already ordered, she wll have them today or tomorrow  It is important that you exercise regularly at least 30 minutes 5 times a week. If you develop chest pain, have severe difficulty breathing, or feel very tired, stop exercising immediately and seek medical attention    It is important that you exercise regularly at least 30 minutes 5 times a week. If you develop chest pain, have severe difficulty breathing, or feel very tired, stop exercising immediately and seek medical attention   Think about what you will eat, plan ahead. Choose  clean, green, fresh or frozen over canned, processed or packaged foods which are more sugary, salty and fatty. 70 to 75% of food eaten should be vegetables and fruit. Three meals at set times with snacks allowed between meals, but they must be fruit or vegetables. Aim to eat over a 12 hour period , example 7 am to 7 pm, and STOP after  your last meal of the day. Drink water ,generally about 64 ounces per day, no other drink is as healthy. Fruit juice is best enjoyed in a healthy way, by EATING the fruit.   Thanks for choosing [facility], we consider it a privelige to serve you.

## 2024-01-29 ENCOUNTER — Ambulatory Visit (INDEPENDENT_AMBULATORY_CARE_PROVIDER_SITE_OTHER): Admitting: Internal Medicine

## 2024-01-29 ENCOUNTER — Ambulatory Visit: Payer: Self-pay | Admitting: Family Medicine

## 2024-01-29 ENCOUNTER — Encounter: Payer: Self-pay | Admitting: Internal Medicine

## 2024-01-29 VITALS — BP 121/73 | HR 82 | Ht 63.0 in | Wt 185.0 lb

## 2024-01-29 DIAGNOSIS — Z87891 Personal history of nicotine dependence: Secondary | ICD-10-CM | POA: Diagnosis not present

## 2024-01-29 DIAGNOSIS — J45991 Cough variant asthma: Secondary | ICD-10-CM | POA: Diagnosis not present

## 2024-01-29 LAB — LIPID PANEL
Chol/HDL Ratio: 2.4 ratio (ref 0.0–4.4)
Cholesterol, Total: 169 mg/dL (ref 100–199)
HDL: 71 mg/dL (ref >39)
LDL Chol Calc (NIH): 84 mg/dL (ref 0–99)
Triglycerides: 75 mg/dL (ref 0–149)
VLDL Cholesterol Cal: 14 mg/dL (ref 5–40)

## 2024-01-29 LAB — CMP14+EGFR
ALT: 16 IU/L (ref 0–32)
AST: 11 IU/L (ref 0–40)
Albumin: 4.5 g/dL (ref 3.9–4.9)
Alkaline Phosphatase: 69 IU/L (ref 49–135)
BUN/Creatinine Ratio: 15 (ref 12–28)
BUN: 17 mg/dL (ref 8–27)
Bilirubin Total: 0.2 mg/dL (ref 0.0–1.2)
CO2: 19 mmol/L — ABNORMAL LOW (ref 20–29)
Calcium: 10.3 mg/dL (ref 8.7–10.3)
Chloride: 102 mmol/L (ref 96–106)
Creatinine, Ser: 1.12 mg/dL — ABNORMAL HIGH (ref 0.57–1.00)
Globulin, Total: 3.4 g/dL (ref 1.5–4.5)
Glucose: 111 mg/dL — ABNORMAL HIGH (ref 70–99)
Potassium: 4 mmol/L (ref 3.5–5.2)
Sodium: 140 mmol/L (ref 134–144)
Total Protein: 7.9 g/dL (ref 6.0–8.5)
eGFR: 54 mL/min/1.73 — ABNORMAL LOW (ref >59)

## 2024-01-29 LAB — HEMOGLOBIN A1C
Est. average glucose Bld gHb Est-mCnc: 120 mg/dL
Hgb A1c MFr Bld: 5.8 % — ABNORMAL HIGH (ref 4.8–5.6)

## 2024-01-29 LAB — VITAMIN D 25 HYDROXY (VIT D DEFICIENCY, FRACTURES): Vit D, 25-Hydroxy: 55.3 ng/mL (ref 30.0–100.0)

## 2024-01-29 LAB — TSH: TSH: 1.89 u[IU]/mL (ref 0.450–4.500)

## 2024-01-29 MED ORDER — ALBUTEROL SULFATE HFA 108 (90 BASE) MCG/ACT IN AERS: 2.0000 | INHALATION_SPRAY | Freq: Four times a day (QID) | RESPIRATORY_TRACT | 1 refills | Status: AC | PRN

## 2024-01-29 NOTE — Assessment & Plan Note (Addendum)
 Onset  around 2021 in pt on symb 160 for AB  -  nl pfts 11/14/2008 quit smoking  2014  - 03/26/2022 reduced symbort to 80 2bid/ max gerd rx due to upper > lower airway symptoms  - Allergy screen 03/26/2022 >  Eos 0.1 /  IgE12   - 03/26/2022  After extensive coaching inhaler device,  effectiveness =    60% from a baseline of < 30%  - FENO 04/10/2022  = 24 on symb 80 2bid with poor hfa technique - Sinus CT 05/01/22 >>>wnl  - 05/07/2022  After extensive coaching inhaler device,  effectiveness =    80% > ok to use symbicort  80 up to 2 bid  - 05/07/2022 added tessalon   200 prn > not really helpful  - PFT's  05/21/22  FEV1 2.05 (89 % ) ratio 0.87  p 0 % improvement from saba p 0 prior to study with DLCO  14.44 (78%)   and FV curve blunted in effort dep portion  and ERV 42% at wt 182    If there really is any asthma here, All goals of chronic asthma control met including optimal function and elimination of symptoms with minimal need for rescue therapy.  Contingencies discussed in full including contacting this office immediately if not controlling the symptoms using the rule of two's.     Pulmonary f/u can be prn increase symptoms or need for saba.          Each maintenance medication was reviewed in detail including emphasizing most importantly the difference between maintenance and prns and under what circumstances the prns are to be triggered using an action plan format where appropriate.  Total time for H and P, chart review, counseling, reviewing hfa  device(s) and generating customized AVS unique to this office visit / same day charting = 25 min

## 2024-01-29 NOTE — Patient Instructions (Signed)
 Use your albuterol as a rescue medication to be used if you can't catch your breath by resting, slowing your pace,  or doing a relaxed purse lip breathing pattern.  - The less you use it, the better it will work when you need it. - Ok to use up to 2 puffs  every 4 hours if you must but call for  appointment if use goes up over your usual need - Don't leave home without it !!  (think of it like a spare tire or starter fluid for your car)   Pulmonary follow up is as needed

## 2024-01-29 NOTE — Progress Notes (Signed)
 Rebecca Rodgers, female    DOB: [birth year]    MRN: [id number]   Brief patient profile:  68 yo [place] female  with nl pfts 11/14/2008 quit smoking  2014 and on Symbicort  shortly p  arrived in [place] in early 2000s  referred to pulmonary clinic in [place]  03/26/2022 by Rebecca Rodgers  for cough onset was while living long term rental house in 2021-22.   Has noticed that much better when not in house for a week.   Nose broken years ago forgot ENT name/ intermittent epistaxis since    History of Present Illness  03/26/2022  Pulmonary/ 1st office eval/ Rebecca Rodgers / Rebecca Rodgers Office on Symbicort  160  Chief Complaint  Patient presents with   Consult    Chronic cough ref by PCP   Dyspnea:  no doe  Cough: worse p waking / stirring x 20 min  assoc with slt yellow nasal d/c chronically Sleep: bed is flat/ 2 pillows  SABA use: rarely  02: none  Lung cancer screen: ordered by Rebecca Rodgers not done as of 09/18/2022  Rec For cough > Mucinex dm 1200 mg every 12 hours as needed for cough (over the counter)  Change Symbicort  to 80 Take 2 puffs first thing in am and then another 2 puffs about 12 hours later and blow symbicort  out thru nose  Stop prilosec and start Pantoprazole  (protonix ) 40 mg   Take  30-60 min before first meal of the day and Pepcid  (famotidine )  20 mg after supper  GERD diet/ bed blocks  referral to sinus CT  > 04/30/22  Please remember to go to the  x-ray department  > did not go Allergy screen 03/26/2022 >  Eos 0.1 /  IgE12        06/18/2022  f/u ov/Rebecca Rodgers office/Rebecca Rodgers re: cough variant asthma vs uacs maint on just pepcid  p supper and prn symbicort  80 not sure helping   Chief Complaint  Patient presents with   Follow-up    Doing well   Dyspnea:  sometimes takes symbicort  before housework and sometimes before gym  treadmill  Cough: cough is worse at hs  Sleeping: 45 degrees with flat bed/ lots of pillows  SABA use: none 02: none  Rec Symbicort  80  Take 2 puffs first thing in am and  then another 2 puffs about 12 hours later x 2 full weeks and if not better breathing/ coughing try back off x at least a week to establish what if any changes you notice  Prilosec (omeprazole ) 40 mg Take 30-60 min before first meal of the day and famotidine  20 mg after supper or bedtime  GERD diet reviewed, bed blocks rec      09/18/2022  3 m f/u ov/Rebecca Rodgers office/Rebecca Rodgers re: AB maint on dulera  100/singulair   and gerd rx  Chief Complaint  Patient presents with   Follow-up  Dyspnea:  ex daily and losing wt  Cough: none  Sleeping: 45 degrees fine  SABA use: none  02: none  Lung cancer screening: ordered again  Rec No change in medications   Please schedule a follow up visit in 12 months but call sooner if needed    01/29/2024  f/u ov/Rebecca Rodgers office/Rebecca Rodgers re:  AB maint on no rx    Chief Complaint  Patient presents with   Cough    Cough Is gone bc she stopped dulera    Dyspnea:  Not limited by breathing from desired activities  but not back in gym yet  Cough: resolved  Sleeping: flat bed 2 pillows s  resp cc  SABA use: none  02: none  Lung cancer screening: does meet criteria with much less than 20 pk y   No obvious day to day or daytime variability or assoc excess/ purulent sputum or mucus plugs or hemoptysis or cp or chest tightness, subjective wheeze or overt sinus or hb symptoms.    Also denies any obvious fluctuation of symptoms with weather or environmental changes or other aggravating or alleviating factors except as outlined above   No unusual exposure hx or h/o childhood pna/ asthma or knowledge of premature birth.  Current Allergies, Complete Past Medical History, Past Surgical History, Family History, and Social History were reviewed in [employer] record.  ROS  The following are not active complaints unless bolded Hoarseness, sore throat, dysphagia, dental problems, itching, sneezing,  nasal congestion or discharge of excess mucus or purulent  secretions, ear ache,   fever, chills, sweats, unintended wt loss or wt gain, classically pleuritic or exertional cp,  orthopnea pnd or arm/hand swelling  or leg swelling, presyncope, palpitations, abdominal pain, anorexia, nausea, vomiting, diarrhea  or change in bowel habits or change in bladder habits, change in stools or change in urine, dysuria, hematuria,  rash, arthralgias, visual complaints, headache, numbness, weakness or ataxia or problems with walking or coordination,  change in mood or  memory.        Current Meds  Medication Sig   acetaminophen  (TYLENOL ) 500 MG tablet Take 500 mg by mouth every 6 (six) hours as needed (pain).   amLODipine  (NORVASC ) 10 MG tablet TAKE 1 TABLET BY MOUTH EVERY DAY   buPROPion  (WELLBUTRIN  XL) 150 MG 24 hr tablet TAKE 1 TABLET BY MOUTH EVERY DAY   Cholecalciferol (VITAMIN D ) 125 MCG (5000 UT) CAPS Take 5,000 Units by mouth every Tuesday.   clotrimazole -betamethasone  (LOTRISONE ) cream APPLY TO AFFECTED AREAS TWICE A DAY   ezetimibe  (ZETIA ) 10 MG tablet TAKE 1 TABLET BY MOUTH EVERY DAY   famotidine  (PEPCID ) 20 MG tablet Take 1 tablet (20 mg total) by mouth daily.   lamoTRIgine  (LAMICTAL ) 200 MG tablet TAKE 1 TABLET BY MOUTH EVERY DAY   levothyroxine  (SYNTHROID ) 50 MCG tablet TAKE 1 TABLET BY MOUTH DAILY AND AN ADDITIONAL 1/2 TAB ON SUNDAY   montelukast  (SINGULAIR ) 10 MG tablet TAKE 1 TABLET BY MOUTH EVERYDAY AT BEDTIME   OLANZapine  (ZYPREXA ) 7.5 MG tablet TAKE 1 TABLET BY MOUTH AT BEDTIME.   omeprazole  (PRILOSEC) 40 MG capsule TAKE 1 CAPSULE BY MOUTH DAILY BEFORE BREAKFAST   rosuvastatin  (CRESTOR ) 40 MG tablet TAKE 1 TABLET BY MOUTH EVERY DAY   traZODone  (DESYREL ) 100 MG tablet Take 1-2 tablets (100-200 mg total) by mouth at bedtime as needed. for sleep   triamterene -hydrochlorothiazide (MAXZIDE-25) 37.5-25 MG tablet TAKE 1 TABLET BY MOUTH EVERY DAY   vitamin C (ASCORBIC ACID) 500 MG tablet Take 500 mg by mouth daily.   VOLTAREN  1 % GEL APPLY TO THE  AFFECTED AREA(S) TWICE A DAY AS NEEDED FOR PAIN            Past Medical History:  Diagnosis Date   Anxiety    Asthma    Depression    Hx of hysterectomy    Hyperlipidemia    Hypertension    Hypothyroidism    Suicide attempt (HCC) 1997   when a relationship broke up     Suicide attempt (HCC)    3 months ago because of ill health of  her parents    Uterine cancer (HCC) 2005       Objective:    wts  01/29/2024        185   09/18/2022        173  06/18/2022        180 05/07/2022        183   04/10/22 180 lb 12.8 oz (82 kg)  03/26/22 181 lb 6.4 oz (82.3 kg)  12/21/21 175 lb (79.4 kg)      Vital signs reviewed  01/29/2024  - Note at rest 02 sats  97% on RA   General appearance:   amb  [place] female nad mod obese by BMI     HEENT : Oropharynx  clear          NECK :  without  apparent JVD/ palpable Nodes/TM    LUNGS: no acc muscle use,  Nl contour chest which is clear to A and P bilaterally without cough on insp or exp maneuvers   CV:  RRR  no s3 or murmur or increase in P2, and no edema   ABD:  obese soft and nontender   MS:  Gait nl   ext warm without deformities Or obvious joint restrictions  calf tenderness, cyanosis or clubbing    SKIN: warm and dry without lesions    NEURO:  alert, approp, nl sensorium with  no motor or cerebellar deficits apparent.     Assessment     Assessment & Plan Cough variant asthma Onset  around 2021 in pt on symb 160 for AB  -  nl pfts 11/14/2008 quit smoking  2014  - 03/26/2022 reduced symbort to 80 2bid/ max gerd rx due to upper > lower airway symptoms  - Allergy screen 03/26/2022 >  Eos 0.1 /  IgE12   - 03/26/2022  After extensive coaching inhaler device,  effectiveness =    60% from a baseline of < 30%  - FENO 04/10/2022  = 24 on symb 80 2bid with poor hfa technique - Sinus CT 05/01/22 >>>wnl  - 05/07/2022  After extensive coaching inhaler device,  effectiveness =    80% > ok to use symbicort  80 up to 2 bid  - 05/07/2022 added  tessalon   200 prn > not really helpful  - PFT's  05/21/22  FEV1 2.05 (89 % ) ratio 0.87  p 0 % improvement from saba p 0 prior to study with DLCO  14.44 (78%)   and FV curve blunted in effort dep portion  and ERV 42% at wt 182    If there really is any asthma here, All goals of chronic asthma control met including optimal function and elimination of symptoms with minimal need for rescue therapy.  Contingencies discussed in full including contacting this office immediately if not controlling the symptoms using the rule of two's.     Pulmonary f/u can be prn increase symptoms or need for saba.          Each maintenance medication was reviewed in detail including emphasizing most importantly the difference between maintenance and prns and under what circumstances the prns are to be triggered using an action plan format where appropriate.  Total time for H and P, chart review, counseling, reviewing hfa  device(s) and generating customized AVS unique to this office visit / same day charting = 25 min          AVS  Patient Instructions  Use your albuterol  as a rescue medication  to be used if you can't catch your breath by resting, slowing your pace,  or doing a relaxed purse lip breathing pattern.  - The less you use it, the better it will work when you need it. - Ok to use up to 2 puffs  every 4 hours if you must but call for  appointment if use goes up over your usual need - Don't leave home without it !!  (think of it like a spare tire or starter fluid for your car)   Pulmonary follow up is as needed       Ozell America, MD 01/29/2024

## 2024-01-31 ENCOUNTER — Ambulatory Visit: Payer: Self-pay | Admitting: Family Medicine

## 2024-02-01 ENCOUNTER — Encounter: Payer: Self-pay | Admitting: Family Medicine

## 2024-02-01 NOTE — Assessment & Plan Note (Signed)

## 2024-02-27 ENCOUNTER — Other Ambulatory Visit: Payer: Self-pay

## 2024-02-27 ENCOUNTER — Telehealth: Payer: Self-pay

## 2024-02-27 DIAGNOSIS — Z0120 Encounter for dental examination and cleaning without abnormal findings: Secondary | ICD-10-CM

## 2024-02-27 NOTE — Telephone Encounter (Signed)
 Sent!

## 2024-02-27 NOTE — Telephone Encounter (Signed)
 Copied from CRM [id number]. Topic: Referral - Request for Referral >> Feb 26, 2024  4:48 PM Leonette SQUIBB wrote: Did the patient discuss referral with their provider in the last year? No (If No - schedule appointment) (If Yes - send message)  Appointment offered? No  Type of order/referral and detailed reason for visit: a new dentist .  Needs a referral per new dental office    Preference of office, provider, location: [facility] [phone]   If referral order, have you been seen by this specialty before? No (If Yes, this issue or another issue? When? Where?  Can we respond through MyChart? No

## 2024-03-03 ENCOUNTER — Other Ambulatory Visit: Payer: Self-pay | Admitting: Family Medicine

## 2024-03-03 ENCOUNTER — Other Ambulatory Visit: Payer: Self-pay | Admitting: Internal Medicine

## 2024-04-07 ENCOUNTER — Other Ambulatory Visit: Payer: Self-pay | Admitting: Family Medicine

## 2024-04-10 ENCOUNTER — Other Ambulatory Visit: Payer: Self-pay | Admitting: Internal Medicine

## 2024-04-10 DIAGNOSIS — J45991 Cough variant asthma: Secondary | ICD-10-CM

## 2024-04-14 ENCOUNTER — Other Ambulatory Visit: Payer: Self-pay | Admitting: Family Medicine

## 2024-04-16 ENCOUNTER — Telehealth: Payer: Self-pay

## 2024-04-16 NOTE — Telephone Encounter (Signed)
 Copied from CRM #[id number]. Topic: Clinical - Medication Refill >> Apr 14, 2024  2:12 PM LaVerne A wrote: Medication: DULERA  100-5 MCG/ACT AERO  Has the patient contacted their pharmacy? No, no refills (Agent: If no, request that the patient contact the pharmacy for the refill. If patient does not wish to contact the pharmacy document the reason why and proceed with request.) (Agent: If yes, when and what did the pharmacy advise?)  This is the patient's preferred pharmacy:  [id number] - [place], [place] - [address] [place] [address] Phone: [phone] Fax: [phone]  Is this the correct pharmacy for this prescription? Yes If no, delete pharmacy and type the correct one.   Has the prescription been filled recently? Yes  Is the patient out of the medication? No  Has the patient been seen for an appointment in the last year OR does the patient have an upcoming appointment? Yes  Can we respond through MyChart? Yes  Agent: Please be advised that Rx refills may take up to 3 business days. We ask that you follow-up with your pharmacy.    Pt stated she was not taking this and Cough Is gone bc she stopped dulera    at last office visit, refill not appropriate

## 2024-04-18 ENCOUNTER — Other Ambulatory Visit: Payer: Self-pay | Admitting: Physician Assistant

## 2024-04-22 ENCOUNTER — Other Ambulatory Visit: Payer: Self-pay | Admitting: Family Medicine

## 2024-04-28 NOTE — Telephone Encounter (Signed)
 Patient scheduled.

## 2024-04-28 NOTE — Telephone Encounter (Signed)
 Copied from CRM #[id number]. Topic: Clinical - Medication Refill >> Apr 27, 2024 12:35 PM LaVerne A wrote: Medication: mometasone -formoterol  (DULERA ) 100-5 MCG/ACT AERO  Has the patient contacted their pharmacy? No, unable to get through on the phone (Agent: If no, request that the patient contact the pharmacy for the refill. If patient does not wish to contact the pharmacy document the reason why and proceed with request.) (Agent: If yes, when and what did the pharmacy advise?)  This is the patient's preferred pharmacy:  [id number] - [place], [place] - [address] [place] [address] Phone: [phone] Fax: [phone] Is this the correct pharmacy for this prescription? Yes If no, delete pharmacy and type the correct one.   Has the prescription been filled recently? Yes  Is the patient out of the medication? No  Has the patient been seen for an appointment in the last year OR does the patient have an upcoming appointment? Yes  Can we respond through MyChart? Yes  Agent: Please be advised that Rx refills may take up to 3 business days. We ask that you follow-up with your pharmacy.   Pt needs appt for further refills of this medication - was stated that she is not using the medications at last office visit also states needs appt for further refills

## 2024-05-10 ENCOUNTER — Ambulatory Visit: Admitting: Internal Medicine

## 2024-05-21 ENCOUNTER — Ambulatory Visit: Admitting: Physician Assistant

## 2024-07-27 ENCOUNTER — Ambulatory Visit: Admitting: Family Medicine

## 2024-09-08 ENCOUNTER — Ambulatory Visit
